# Patient Record
Sex: Male | Born: 1935 | Race: White | Hispanic: No | Marital: Married | Smoking: Former smoker
Health system: Southern US, Community
[De-identification: ages and names within clinical notes are randomized; demographics above are authoritative.]

## PROBLEM LIST (undated history)

## (undated) DIAGNOSIS — I77810 Thoracic aortic ectasia: Secondary | ICD-10-CM

## (undated) DIAGNOSIS — I10 Essential (primary) hypertension: Secondary | ICD-10-CM

## (undated) DIAGNOSIS — E7800 Pure hypercholesterolemia, unspecified: Secondary | ICD-10-CM

## (undated) DIAGNOSIS — I719 Aortic aneurysm of unspecified site, without rupture: Secondary | ICD-10-CM

## (undated) DIAGNOSIS — M1990 Unspecified osteoarthritis, unspecified site: Secondary | ICD-10-CM

## (undated) DIAGNOSIS — I420 Dilated cardiomyopathy: Secondary | ICD-10-CM

## (undated) DIAGNOSIS — L4050 Arthropathic psoriasis, unspecified: Secondary | ICD-10-CM

## (undated) DIAGNOSIS — I77819 Aortic ectasia, unspecified site: Secondary | ICD-10-CM

## (undated) DIAGNOSIS — K759 Inflammatory liver disease, unspecified: Secondary | ICD-10-CM

## (undated) DIAGNOSIS — K219 Gastro-esophageal reflux disease without esophagitis: Secondary | ICD-10-CM

## (undated) DIAGNOSIS — I2510 Atherosclerotic heart disease of native coronary artery without angina pectoris: Secondary | ICD-10-CM

## (undated) DIAGNOSIS — I351 Nonrheumatic aortic (valve) insufficiency: Secondary | ICD-10-CM

## (undated) DIAGNOSIS — I480 Paroxysmal atrial fibrillation: Secondary | ICD-10-CM

## (undated) DIAGNOSIS — E669 Obesity, unspecified: Secondary | ICD-10-CM

## (undated) DIAGNOSIS — G4733 Obstructive sleep apnea (adult) (pediatric): Secondary | ICD-10-CM

## (undated) DIAGNOSIS — G8929 Other chronic pain: Secondary | ICD-10-CM

## (undated) DIAGNOSIS — M545 Low back pain, unspecified: Secondary | ICD-10-CM

## (undated) DIAGNOSIS — Z9889 Other specified postprocedural states: Secondary | ICD-10-CM

## (undated) DIAGNOSIS — E785 Hyperlipidemia, unspecified: Secondary | ICD-10-CM

## (undated) DIAGNOSIS — N281 Cyst of kidney, acquired: Secondary | ICD-10-CM

## (undated) DIAGNOSIS — Z9289 Personal history of other medical treatment: Secondary | ICD-10-CM

## (undated) DIAGNOSIS — G5722 Lesion of femoral nerve, left lower limb: Secondary | ICD-10-CM

## (undated) DIAGNOSIS — Z9989 Dependence on other enabling machines and devices: Secondary | ICD-10-CM

## (undated) HISTORY — DX: Gastro-esophageal reflux disease without esophagitis: K21.9

## (undated) HISTORY — PX: TONSILLECTOMY: SUR1361

## (undated) HISTORY — DX: Essential (primary) hypertension: I10

## (undated) HISTORY — PX: JOINT REPLACEMENT: SHX530

## (undated) HISTORY — PX: BACK SURGERY: SHX140

## (undated) HISTORY — PX: UMBILICAL HERNIA REPAIR: SHX196

## (undated) HISTORY — DX: Thoracic aortic ectasia: I77.810

## (undated) HISTORY — PX: TOTAL HIP ARTHROPLASTY: SHX124

## (undated) HISTORY — DX: Aortic aneurysm of unspecified site, without rupture: I71.9

## (undated) HISTORY — PX: TOTAL KNEE ARTHROPLASTY: SHX125

## (undated) HISTORY — DX: Paroxysmal atrial fibrillation: I48.0

## (undated) HISTORY — PX: SHOULDER OPEN ROTATOR CUFF REPAIR: SHX2407

## (undated) HISTORY — PX: APPENDECTOMY: SHX54

## (undated) HISTORY — DX: Obesity, unspecified: E66.9

## (undated) HISTORY — DX: Atherosclerotic heart disease of native coronary artery without angina pectoris: I25.10

## (undated) HISTORY — PX: LUMBAR LAMINECTOMY: SHX95

## (undated) HISTORY — PX: HERNIA REPAIR: SHX51

## (undated) HISTORY — DX: Aortic ectasia, unspecified site: I77.819

## (undated) HISTORY — PX: LAPAROSCOPIC CHOLECYSTECTOMY: SUR755

## (undated) HISTORY — DX: Lesion of femoral nerve, left lower limb: G57.22

## (undated) HISTORY — DX: Unspecified osteoarthritis, unspecified site: M19.90

## (undated) HISTORY — DX: Arthropathic psoriasis, unspecified: L40.50

## (undated) HISTORY — DX: Obstructive sleep apnea (adult) (pediatric): G47.33

## (undated) HISTORY — PX: CATARACT EXTRACTION W/ INTRAOCULAR LENS  IMPLANT, BILATERAL: SHX1307

## (undated) HISTORY — PX: REVISION TOTAL HIP ARTHROPLASTY: SHX766

## (undated) HISTORY — DX: Cyst of kidney, acquired: N28.1

## (undated) HISTORY — DX: Dilated cardiomyopathy: I42.0

## (undated) HISTORY — DX: Hyperlipidemia, unspecified: E78.5

## (undated) HISTORY — DX: Nonrheumatic aortic (valve) insufficiency: I35.1

---
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
## 1998-11-03 ENCOUNTER — Encounter: Payer: Self-pay | Admitting: Orthopedic Surgery

## 1998-11-10 ENCOUNTER — Encounter: Payer: Self-pay | Admitting: Orthopedic Surgery

## 1998-11-10 ENCOUNTER — Inpatient Hospital Stay (HOSPITAL_COMMUNITY): Admission: RE | Admit: 1998-11-10 | Discharge: 1998-11-14 | Payer: Self-pay | Admitting: Orthopedic Surgery

## 1998-12-15 ENCOUNTER — Encounter: Payer: Self-pay | Admitting: Surgery

## 1998-12-15 ENCOUNTER — Ambulatory Visit (HOSPITAL_COMMUNITY): Admission: RE | Admit: 1998-12-15 | Discharge: 1998-12-16 | Payer: Self-pay | Admitting: Surgery

## 1999-10-20 ENCOUNTER — Ambulatory Visit (HOSPITAL_BASED_OUTPATIENT_CLINIC_OR_DEPARTMENT_OTHER): Admission: RE | Admit: 1999-10-20 | Discharge: 1999-10-20 | Payer: Self-pay | Admitting: Surgery

## 2001-04-14 ENCOUNTER — Encounter: Admission: RE | Admit: 2001-04-14 | Discharge: 2001-04-14 | Payer: Self-pay | Admitting: Geriatric Medicine

## 2001-04-14 ENCOUNTER — Encounter: Payer: Self-pay | Admitting: Geriatric Medicine

## 2003-10-08 ENCOUNTER — Ambulatory Visit (HOSPITAL_COMMUNITY): Admission: RE | Admit: 2003-10-08 | Discharge: 2003-10-08 | Payer: Self-pay | Admitting: Gastroenterology

## 2003-11-22 ENCOUNTER — Encounter: Admission: RE | Admit: 2003-11-22 | Discharge: 2003-11-22 | Payer: Self-pay | Admitting: Orthopedic Surgery

## 2003-12-16 ENCOUNTER — Inpatient Hospital Stay (HOSPITAL_COMMUNITY): Admission: RE | Admit: 2003-12-16 | Discharge: 2003-12-20 | Payer: Self-pay | Admitting: Orthopedic Surgery

## 2005-02-01 ENCOUNTER — Inpatient Hospital Stay (HOSPITAL_COMMUNITY): Admission: RE | Admit: 2005-02-01 | Discharge: 2005-02-04 | Payer: Self-pay | Admitting: Orthopedic Surgery

## 2008-08-16 DIAGNOSIS — N281 Cyst of kidney, acquired: Secondary | ICD-10-CM

## 2008-08-16 HISTORY — DX: Cyst of kidney, acquired: N28.1

## 2009-01-17 ENCOUNTER — Encounter: Admission: RE | Admit: 2009-01-17 | Discharge: 2009-01-17 | Payer: Self-pay | Admitting: Chiropractic Medicine

## 2009-03-10 ENCOUNTER — Encounter: Admission: RE | Admit: 2009-03-10 | Discharge: 2009-03-10 | Payer: Self-pay | Admitting: Geriatric Medicine

## 2010-05-07 ENCOUNTER — Encounter: Admission: RE | Admit: 2010-05-07 | Discharge: 2010-05-07 | Payer: Self-pay | Admitting: Geriatric Medicine

## 2010-12-28 ENCOUNTER — Other Ambulatory Visit: Payer: Self-pay | Admitting: Orthopedic Surgery

## 2010-12-28 ENCOUNTER — Other Ambulatory Visit (HOSPITAL_COMMUNITY): Payer: Self-pay | Admitting: Orthopedic Surgery

## 2010-12-28 ENCOUNTER — Ambulatory Visit (HOSPITAL_COMMUNITY)
Admission: RE | Admit: 2010-12-28 | Discharge: 2010-12-28 | Disposition: A | Discharge status: Home / Self Care | Payer: No Typology Code available for payment source | Source: Ambulatory Visit | Attending: Orthopedic Surgery | Admitting: Orthopedic Surgery

## 2010-12-28 ENCOUNTER — Encounter (HOSPITAL_COMMUNITY): Payer: No Typology Code available for payment source

## 2010-12-28 DIAGNOSIS — Z01810 Encounter for preprocedural cardiovascular examination: Secondary | ICD-10-CM | POA: Insufficient documentation

## 2010-12-28 DIAGNOSIS — Z01812 Encounter for preprocedural laboratory examination: Secondary | ICD-10-CM | POA: Insufficient documentation

## 2010-12-28 DIAGNOSIS — Z79899 Other long term (current) drug therapy: Secondary | ICD-10-CM | POA: Insufficient documentation

## 2010-12-28 DIAGNOSIS — Z01811 Encounter for preprocedural respiratory examination: Secondary | ICD-10-CM

## 2010-12-28 DIAGNOSIS — M5380 Other specified dorsopathies, site unspecified: Secondary | ICD-10-CM | POA: Insufficient documentation

## 2010-12-28 DIAGNOSIS — M1710 Unilateral primary osteoarthritis, unspecified knee: Secondary | ICD-10-CM | POA: Insufficient documentation

## 2010-12-28 DIAGNOSIS — Z87891 Personal history of nicotine dependence: Secondary | ICD-10-CM | POA: Insufficient documentation

## 2010-12-28 DIAGNOSIS — Z01818 Encounter for other preprocedural examination: Secondary | ICD-10-CM | POA: Insufficient documentation

## 2010-12-28 DIAGNOSIS — I10 Essential (primary) hypertension: Secondary | ICD-10-CM | POA: Insufficient documentation

## 2010-12-28 LAB — URINALYSIS, ROUTINE W REFLEX MICROSCOPIC
Hgb urine dipstick: NEGATIVE
Ketones, ur: NEGATIVE mg/dL
Protein, ur: NEGATIVE mg/dL
Urobilinogen, UA: 0.2 mg/dL (ref 0.0–1.0)
pH: 6 (ref 5.0–8.0)

## 2010-12-28 LAB — CBC
MCH: 28 pg (ref 26.0–34.0)
MCHC: 33.8 g/dL (ref 30.0–36.0)

## 2010-12-28 LAB — SURGICAL PCR SCREEN: Staphylococcus aureus: NEGATIVE

## 2010-12-28 LAB — COMPREHENSIVE METABOLIC PANEL
ALT: 47 U/L (ref 0–53)
AST: 35 U/L (ref 0–37)
BUN: 19 mg/dL (ref 6–23)
Chloride: 101 mEq/L (ref 96–112)
GFR calc Af Amer: >60 mL/min (ref >60)
GFR calc non Af Amer: >60 mL/min (ref >60)

## 2010-12-28 LAB — APTT: aPTT: 33 seconds (ref 24–37)

## 2011-01-01 ENCOUNTER — Emergency Department (HOSPITAL_COMMUNITY)
Admission: EM | Admit: 2011-01-01 | Discharge: 2011-01-02 | Disposition: A | Discharge status: Home / Self Care | Payer: No Typology Code available for payment source | Attending: Emergency Medicine | Admitting: Emergency Medicine

## 2011-01-01 ENCOUNTER — Emergency Department (HOSPITAL_COMMUNITY): Payer: No Typology Code available for payment source

## 2011-01-01 DIAGNOSIS — M62838 Other muscle spasm: Secondary | ICD-10-CM | POA: Insufficient documentation

## 2011-01-01 DIAGNOSIS — M542 Cervicalgia: Secondary | ICD-10-CM | POA: Insufficient documentation

## 2011-01-01 DIAGNOSIS — M47812 Spondylosis without myelopathy or radiculopathy, cervical region: Secondary | ICD-10-CM | POA: Insufficient documentation

## 2011-01-01 DIAGNOSIS — I10 Essential (primary) hypertension: Secondary | ICD-10-CM | POA: Insufficient documentation

## 2011-01-01 DIAGNOSIS — R51 Headache: Secondary | ICD-10-CM | POA: Insufficient documentation

## 2011-01-01 NOTE — Op Note (Signed)
[name], [name]                ACCOUNT NO.:  [id number]   MEDICAL RECORD NO.:  [id number]          PATIENT TYPE:  INP   LOCATION:  0001                         FACILITY:  [facility]   PHYSICIAN:  Ollen Gross, M.D.    DATE OF BIRTH:  [birth year]   DATE OF PROCEDURE:  02/01/2005  DATE OF DISCHARGE:                                 OPERATIVE REPORT   PREOPERATIVE DIAGNOSES:  Osteoarthritis left knee.   POSTOPERATIVE DIAGNOSES:  Osteoarthritis left knee.   PROCEDURE:  Left total knee arthroplasty computer navigated.   SURGEON:  Ollen Gross, M.D.   ASSISTANT:  Alexzandrew L. Julien Girt, P.A.   ANESTHESIA:  Spinal.   ESTIMATED BLOOD LOSS:  Minimal.   DRAINS:  Hemovac x1.   TOURNIQUET TIME:  65 minutes at 300 mmHg.   COMPLICATIONS:  None.   CONDITION:  Stable to recovery.   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 He has  a large valgus deformity of the left knee. He has failed nonoperative  management including injections and presents now for left total knee  arthroplasty.   PROCEDURE IN DETAIL:  After successful initiation of spinal anesthetic, a  tourniquet was placed high on his left thigh and left lower extremity  prepped and draped in the usual sterile fashion. Extremities wrapped in  Esmarch, knee flexed and tourniquet inflated to 300 mmHg. A standard midline  incision was made with a 10 blade through the subcutaneous tissue to the  level of the extensor mechanism. Given his large valgus deformity, we did a  lateral parapatellar arthrotomy. The soft tissue over the proximal lateral  tibia subperiosteally elevated. Patella was then everted medially, knee  flexed 90 degrees, and ACL and PCL removed. The 4 mm Shantz pins for the  computer are then placed two into the tibia and two into the femur. The  computerized arrays are then placed onto the pins. The anatomic data  is then  input into the computer for generation of the anatomical model. Once  generated, it showed that he was in 5 degrees of valgus with about a 5  degree flexion contracture. After I had released the soft tissues, we saw  that he was able to be corrected down to about 8 degrees of valgus just from  the soft tissue correction alone. The femur was then addressed by placing  the distal femoral cutting block. We had planned to remove 10 mL off the  distal femur with neutral varus-valgus and neutral flexion extension. The  block is pinned and resection made with an oscillating saw. The verifier is  placed and the and resection level is made as planned. The rotational block  is placed referencing off the epicondylar axis. A size 5 is the size  generated by the computer and we confirmed this with an intraop measurement.  A size five cutting block is then placed and the anterior posterior chamfer  cuts are made. They are verified to be correct.   Tibia subluxed forward and menisci  removed. Tibial cutting guide is placed  under computer guidance to remove approximately 9 mm off the medial side  which was partially deficient corresponding with about 4-5 mm off the  lateral side. The block is pinned to make the cut and about 2 degrees of  flexion and neutral varus-valgus. Resection is made with an oscillating saw  and it is confirmed to be made as planned. The size four is the most  appropriate tibial component and the proximal tibia is then prepared with a  modular drill and keel punch for a size four. The femoral preparation is  then completed the intercondylar cut for a size five.   A size four mobile bearing tibial trial with a size five posterior  stabilized femoral trial and a 10 mm posterior stabilized rotating platform  insert trial are placed. With the 10, full extension is achieved with  excellent varus valgus balance throughout full range of motion. We confirmed  that we corrected  him to within 1.5 degrees of valgus alignment. This was  felt to be an excellent correction from his preop deformity. He was also  achieving full extension. The osteophytes are then removed off the posterior  femur with the trial in place. The pins are then removed from the tibia and  femur. Patella was everted and thickness measured to the 23 mm. Freehand  resection is taken to 14 mm. A 38 template is placed, lug holes were  drilled, trial patella is placed and it tracks normally. All trials were  then removed and the cut bone surfaces are repaired with pulsatile lavage.  Cement is mixed and once ready for reimplantation, a size four mobile  bearing tibial tray, size five posterior stabilized femur and 38 patella are  cemented into place and the patella is held with a clamp. A trial 10 mm  insert is placed, knee held in full extension and all extruded cement  removed. Once the cement is fully hardened then the permanent 10 mm  posterior stabilized rotating platform insert is placed into the tibial  tray. The wound is copiously irrigated with saline solution. The tourniquet  is then released with a total time of 65 minutes. Minor bleeding stopped  with cautery. The arthrotomy was closed over a hemovac drain with  interrupted #1 PDS. We left open the area from the inferior to the superior  pole of the patella to serve as a mini lateral release. Patella tracks  normally with flexion of about 135 degrees. There is excellent balance  throughout full range of motion. Subcu is then closed with interrupted 2-0  Vicryl, subcuticular running 4-0 Monocryl. Incisions cleaned and dried and  Steri-Strips and a bulky sterile dressing applied. Drains hooked to suction.  He is placed into a knee immobilizer, awakened and transported to recovery  in stable condition.       FA/MEDQ  D:  02/01/2005  T:  02/01/2005  Job:  [id number]

## 2011-01-01 NOTE — H&P (Signed)
[name], [name]                ACCOUNT NO.:  [id number]   MEDICAL RECORD NO.:  [id number]          PATIENT TYPE:  INP   LOCATION:  NA                           [facility]ACILITY:  [facility]   PHYSICIAN:  Ollen Gross, M.D.    DATE OF BIRTH:  1934/11/19   DATE OF ADMISSION:  02/01/2005  DATE OF DISCHARGE:                                HISTORY & PHYSICAL   CHIEF COMPLAINT:  Left knee pain.   HISTORY OF PRESENT ILLNESS:  [name] well known to Dr. Ollen Gross having previously undergone a left total hip arthroplasty and has  done quite well. He has ongoing knee pain. He is noted to have a valgus  deformity of that knee. It starts in the knee and travels all the way up  through the thigh effecting his ability to ambulate and also effects what he  can and cannot do. He has reached a point now where it is hurting him all  the time and wants to consider surgical intervention. He is seen in the  office where he has noted valgus deformity with end-stage arthritic changes.  It was thought he would benefit from undergoing left knee replacement. The  risks and benefits were discussed and the patient is subsequently admitted  to the hospital. He has been seen preoperatively by Dr. Ann Maki T. Stoneking who  felt that there was no contraindication for his up and coming knee surgery.   ALLERGIES:  PENICILLIN with a local reaction. No systemic reaction.   MEDICATIONS:  1.  Altace 10 mg daily.  2.  Diclofenac 50 mg b.i.d. stop prior to surgery.  3.  Norvasc 5 mg daily.  4.  Lipitor 20 mg daily.   PAST MEDICAL HISTORY:  1.  Hypercholesterolemia.  2.  Hypertension.  3.  Hiatal hernia.  4.  History of hepatitis.  5.  History of jaundice.  6.  Internal hemorrhoids.  7.  Osteoarthritis.  8.  Degenerative disc disease.   PAST SURGICAL HISTORY:  1.  Left and right hip surgeries in 1954, both for slipped capital      epithesis.  2.  Appendectomy in 1959.  3.  Colonoscopy.  4.  Hernia surgery  around 2000.  5.  Gallbladder surgery around 2000.  6.  He has undergone a left total hip replacement arthroplasty and also a      right hip replacement arthroplasty about six years ago.  7.  Right shoulder surgery n 1996.  8.  Back surgery in 1983.   SOCIAL HISTORY:  Married. Pastor. Nonsmoker, no alcohol. Four children. His  wife will be assisting with care after surgery.   FAMILY HISTORY:  Father with a history of arthritis and diabetes. Mother  with a history of breast cancer.   REVIEW OF SYMPTOMS:  GENERAL:  No fevers, chills, or night sweats.  NEUROLOGICAL:  No seizures, syncope, or paralysis. RESPIRATORY:  No  shortness of breath, productive cough, or hemoptysis. CARDIOVASCULAR:  No  chest pain, angina, or orthopnea. GI:  No nausea, vomiting, diarrhea, or  constipation. No blood or mucus in the  stool. GU:  No dysuria, hematuria, or  discharge. MUSCULOSKELETAL:  Left knee found in the history of present  illness.   PHYSICAL EXAMINATION:  VITAL SIGNS:  Pulse 64, respirations 12, blood  pressure 106/64.  GENERAL:  A 76 year old white male well-developed, well-nourished, large  frame, in no acute distress, slightly overweight. Alert, oriented, and  cooperative.  HEENT:  Normocephalic and atraumatic. Pupils are equal, round, and reactive.  Oropharynx clear. EOMs intact.  CHEST:  Clear anterior and posterior chest wall.  HEART:  Regular rate and rhythm. No murmurs.  ABDOMEN:  Soft and nontender. Bowel sounds present.  RECTAL:  Not done, not pertinent to present illness.  BREASTS:  Not done, not pertinent to present illness.  GENITALIA:  Not done, not pertinent to present illness.  EXTREMITIES:  Left knee shows a valgus deformity, malalignment, marked  crepitus noted on passive range of motion. Range of motion of 10 to 110  degrees. No instability. She had no effusion.   IMPRESSION:  1.  Osteoarthritis left knee.  2.  Hypercholesterolemia.  3.  Hypertension.  4.  Hiatal  hernia.  5.  History of hepatitis.  6.  History of jaundice.  7.  History of internal hemorrhoids.  8.  Degenerative disc disease.   PLAN:  The patient is admitted to [facility] to undergo a left  total knee replacement arthroplasty. Surgery will be performed by Dr. Ollen Gross. His medical physician is Dr. Ann Maki T. Stoneking who will be notified  of the room on admission and be consulted if needed for medical assistance  with the patient throughout the hospital course.       ALP/MEDQ  D:  01/31/2005  T:  01/31/2005  Job:  [id number]   cc:   Hal T. Stoneking, M.D.  [address]  [place], [place] [address]  Fax: [phone]   Ollen Gross, M.D.  [facility]  [address]  [address]  [place]  [place] [address]  Fax: [phone]

## 2011-01-01 NOTE — Discharge Summary (Signed)
NAME:  [name], [name]                          ACCOUNT NO.:  [id number]   MEDICAL RECORD NO.:  [id number]                   PATIENT TYPE:  INP   LOCATION:  0476                                 [facility]ACILITY:  [facility]   PHYSICIAN:  Ollen Gross, M.D.                 DATE OF BIRTH:  09/19/1934   DATE OF ADMISSION:  12/16/2003  DATE OF DISCHARGE:  12/20/2003                                 DISCHARGE SUMMARY   ADMISSION DIAGNOSES:  1. Osteoarthritis, left hip.  2. Hypercholesterolemia.  3. Hypertension.  4. Hiatal hernia.  5. Hemorrhoids.  6. Degenerative disk disease.   DISCHARGE DIAGNOSES:  1. Osteoarthritis, left hip, status post left total hip arthroplasty.  2. Hypercholesterolemia.  3. Hypertension.  4. Hiatal hernia.  5. Hemorrhoids.  6. Degenerative disk disease.  7. Mild postoperative hyponatremia.  8. Mild postoperative hypokalemia.  9. Postoperative ileus, improved.   PROCEDURE:  The patient was taken to the OR on Dec 16, 2003 and underwent  left total hip arthroplasty.   SURGEON:  Dr. Trudee Grip.   ASSISTANT:  Alexzandrew L. Perkins, P.A.-C.   ANESTHESIA:  General.   BLOOD LOSS:  500 cc.   DRAINS:  Hemovac x1.   BRIEF HISTORY:  Mr. [name] is a 76 year old male with end-stage arthritis  of the left hip. The pain has been refractory to nonoperative management.  Now presents for total hip replacement.   CONSULTATIONS:  GI, Dr. Danise Edge.   LABORATORY DATA:  CBC on admission:  Hemoglobin 15.9, hematocrit 46.2, white  cell count 8.9, differential within normal limits. Postoperative H and H  13.2 and 38.9, lasted noted H and H 12.8 and 37.7. PT/PTT preoperatively  12.1 and 30 respectively, INR 0.9. Serial protimes were followed, last noted  PT/INR 24.0 and 3.0. Chem panel on admission:  Elevated BUN to 24.  Remaining chem panel and all other serial BMETs were followed. BUN 24, back  down to 13, back up to 25. Sodium dropped 136, down to 130, last noted 129.  Potassium dropped, 4.4 down to 3.9, lasted noted 3.2. Glucose 176 to 157,  back down 122. Urinalysis:  Preoperative negative. Blood type A positive.   EKG dated December 09, 2003:  Normal sinus rhythm, normal EKG confirmed by  __________.  Two-view chest on December 09, 2003:  No acute cardiopulmonary  disease. Two-view of the left hip:  Moderate degenerative changes, left hip,  no acute bony abnormalities. Postoperative pelvis and hip films:  Good  appearance of left total hip replacement arthroplasty. Portable KUB,  abdominal film on Dec 18, 2003:  Mild diffuse gaseous distention, large and  small bowel suggest ileus. Postoperative portable abdominal film on May  2005:  Slight improvement of diffuse ileus.   HOSPITAL COURSE:  The patient was admitted to [facility], taken to  the OR, and underwent the above stated procedure without complication. The  patient tolerated  the procedure well and was transferred to the recovery  room and to the orthopedic floor for continued postoperative care, and vital  signs were followed. The patient was given 24 hours postoperative IV  antibiotics in the form of Ancef. Given Coumadin postoperative. Started back  on home medications. PT and OT were consulted. The patient was placed on  partial weight bearing 50% to the left lower extremity. Hemovac drain was  placed during surgery and pulled on postoperative day #1. The patient had a  little bit of discomfort, complaining of incision pain, otherwise doing  well. By day #2, the patient was not feeling well, had some abdominal  bloating and cramping. KUB was ordered that did show some gaseous  distention, indicating ileus. IV Reglan was ordered. Dressing was changed  from the hip incision. The incision was healing well. Did have some proximal  blisters. Tegaderm was placed over them. By day #3, the patient was feeling  much better. Followup KUB did show improvement in the ileus. He had started  to move  his bowels and had some diarrhea. He did have some nausea and  vomiting postoperative which was felt to be due to the ileus. Fluids were  increased. Gastroenterology consult was called. The patient was seen in  consultation by Dr. Robin Searing. The patient was started on IV Protonix.  The narcotic medications and muscle relaxers were stopped, started on  MiraLax. By the following day, the patient was doing much better, moving his  bowels. Heartburn had improved. Started to eat a few bits of food. Once the  ileus had improved, he was up ambulating much better with physical therapy  approximately 60 feet, already done some stair training. He was actually  doing so well that he felt better. It was okay from Dr. Henriette Combs standpoint  that the patient be discharged home. He was set up to go later that day.   DISCHARGE PLAN:  The patient was discharged home on Dec 20, 2003.   DISCHARGE MEDICATIONS:  Prilosec and Coumadin. The patient does have  Darvocet at home for pain.   ACTIVITY:  Up as tolerated, 50% partial weight bearing, left lower  extremity. Home health PT, home health nursing.   FOLLOW UP:  Two weeks from surgery.   DISPOSITION:  Home.   DIET:  As tolerated.   CONDITION ON DISCHARGE:  Improved.     Alexzandrew L. Julien Girt, P.A.              Ollen Gross, M.D.    ALP/MEDQ  D:  01/01/2004  T:  01/01/2004  Job:  [id number]   cc:   Danise Edge, M.D.  [address]  [place]  [place] [address]  Fax: [phone]

## 2011-01-01 NOTE — Op Note (Signed)
[facility]  Patient:    [name], [name]                       MRN: [id number] Proc. Date: 10/20/99 Adm. Date:  [id number] Disc. Date: [id number] Attending:  Bonnetta Barry CC:         Velora Heckler, M.D.                           Operative Report  PREOPERATIVE DIAGNOSIS:  Incisional hernia.  POSTOPERATIVE DIAGNOSIS:  Incisional hernia.  OPERATION PERFORMED:  Repair incisional hernia with Prolene mesh.  SURGEON:  Velora Heckler, M.D.  ASSISTANT:  ANESTHESIA:  General.  ESTIMATED BLOOD LOSS:  Minimal.  PREPARATION:  Betadine.  COMPLICATIONS:  None.  INDICATIONS FOR PROCEDURE:  The patient is a 76 year old white male who had previously undergone laparoscopic cholecystectomy.  The incision at the umbilicus had apparently herniated and now contains a moderate amount of intestine within the hernia sac.  The patient comes to surgery for repair.  DESCRIPTION OF PROCEDURE:  The procedure was done in OR #4 at the [facility].  The patient was brought to the operating room and placed in supine position on the operating room table.  Following administration of general anesthesia, the patient was prepped and draped in the usual strict aseptic fashion. After ascertaining that an adequate level of anesthesia had been obtained, a transverse incision was made just below the umbilicus.  Dissection was carried own through the subcutaneous tissues.  A hernia sac was identified and opened.  It oes contain multiple loops of small bowel which are reduced back within the peritoneal cavity.  The hernia sac was excised.  The fascial edges were developed.  The subcutaneous were elevated off of the fascia in a wide plane around the hernia.  The umbilicus was mobilized and there was a small umbilical hernia as well. The small bowel was reduced back within the peritoneal cavity.  The fascial edges were freshened and then closed  with interrupted #1 Ethibond sutures.  The umbilical defect was closed with #1 interrupted Ethibond sutures.  Next, the repair was reinforced with a sheet of Prolene mesh.  A 3 x 6 inch sheet of mesh was rounded on the corners and then placed in the wound.  It was secured circumferentially with 0 Ethibond sutures.  Good hemostasis was noted.  A #10 Jackson-Pratt drain was brought in from the right side of the abdomen and secured to the skin with a 3-0 nylon suture.  It was laid across the mesh.  The umbilicus was affixed back down to the mesh with an interrupted 3-0 Vicryl suture.  The subcutaneous tissues were reapproximated with interrupted 3-0 Vicryl sutures.  The skin was closed with interrupted 4-0 Vicryl subcuticular sutures.  The wound was washed and dried and benzoin and Steri-Strips were applied.  Sterile gauze dressings were applied. he patient was brought to the recovery room in good condition.  The patient tolerated the procedure well. DD:  10/20/99 TD:  10/21/99 Job: [id number]

## 2011-01-01 NOTE — Op Note (Signed)
NAME:  LAURANCE, HEIDE                          ACCOUNT NO.:  [id number]   MEDICAL RECORD NO.:  [id number]                   PATIENT TYPE:  INP   LOCATION:  0476                                 FACILITY:  [facility]   PHYSICIAN:  Ollen Gross, M.D.                 DATE OF BIRTH:  Jan 12, 1935   DATE OF PROCEDURE:  12/16/2003  DATE OF DISCHARGE:                                 OPERATIVE REPORT   PREOPERATIVE DIAGNOSES:  Osteoarthritis, left hip.   POSTOPERATIVE DIAGNOSES:  Osteoarthritis, left hip.   PROCEDURE:  Left total hip arthroplasty.   SURGEON:  Ollen Gross, M.D.   ASSISTANT:  Alexzandrew L. Julien Girt, P.A.   ANESTHESIA:  General.   ESTIMATED BLOOD LOSS:  500   DRAINS:  Hemovac x1.   COMPLICATIONS:  None.   CONDITION:  Stable to recovery.   BRIEF CLINICAL NOTE:  Mr. Reetz is a 76 year old male with end-stage  arthritis of the left hip with pain refractory to nonoperative management.  He presents now for left total hip arthroplasty.   DESCRIPTION OF PROCEDURE:  After successful administration of general  anesthetic, the patient was placed in the right lateral decubitus position  with the left side up and held with a hip positioner. The left lower  extremity was isolated from his perineum with plastic drapes and prepped and  draped in the usual sterile fashion. A standard posterolateral incision was  made with a 10 blade through the subcutaneous tissues to the level of the  fascia lata which was incised in line with the skin incision. The sciatic  nerve was palpated and protected and the short external rotators isolated  off the femur. Capsulectomy is performed and the hip is dislocated.  The  center of the femoral head is marked and a trial prosthesis placed such that  the center of the trial head corresponds to the center of his native femoral  head.  Osteotomy line is marked on the femoral neck and osteotomy made with  the oscillating saw. The femur is then retracted  anteriorly to gain  acetabular exposure.   The labrum and osteophytes removed from the acetabulum, the reaming starts  at a 45 coursing in increments of 2 to a 57 and then a 58 mm pinnacle  acetabular shell was placed in anatomic position and transfixed with two  dome screws. A trial 32 mm neutral liner was placed.   The femur was prepared first with a canal finder and then irrigation. Axial  reaming is performed to 17.5 mm, proximal reaming to a 17F and the sleeve  machined to an XXL.  The 17F XXL sleeve trial was placed with a 22 x 17  stem, 36 plus 8 neck matching his native anteversion which was 20 degrees.  The 32 plus 0 head is placed and the hip is reduced with excellent  stability, full extension, full external rotation, 70 degrees flexion,  40  degrees adduction, 90 degrees internal rotation and 90 degrees flexion and  70 degrees internal rotation.  By placing the left leg on top of the right,  the leg lengths are found to be equal.   All trials are removed and the permanent apex hole eliminator is placed into  the acetabular shell. The permanent 32 mm neutral marathon liner is placed.  A permanent 71F XXL sleeve is placed with a 22 x 17 stem, 36 plus 8 neck  matching his native anteversion. The 32 plus 0 head is then placed and the  hip is reduced with the same stability parameters. The wound is copiously  irrigated with antibiotic solution and short rotators reattached to the  femur through drill holes. The fascia lata is closed over a Hemovac drain  with interrupted #1 Vicryl, subcu closed with #1 and 2-0 Vicryl and  subcuticular with running 4-0 Monocryl. The incision was clean and dry and  Steri-Strips and a bulky sterile dressing applied. Prior to placing a  dressing, 30 mL of 0.25% Marcaine with epinephrine injected into the subcu  tissues about the incision. After the dressing was applied, he was placed  into a knee immobilizer, awakened and transported to recovery in  stable  condition.                                               Ollen Gross, M.D.    FA/MEDQ  D:  12/16/2003  T:  12/17/2003  Job:  [id number]

## 2011-01-01 NOTE — H&P (Signed)
NAME:  [name], [name]                          ACCOUNT NO.:  [id number]   MEDICAL RECORD NO.:  [id number]                   PATIENT TYPE:  INP   LOCATION:  0476                                 FACILITY:  [facility]   PHYSICIAN:  Ollen Gross, M.D.                 DATE OF BIRTH:  [birth year]   DATE OF ADMISSION:  12/16/2003  DATE OF DISCHARGE:                                HISTORY & PHYSICAL   CHIEF COMPLAINT:  Left hip pain.   HISTORY OF PRESENT ILLNESS:  The patient is a 76 year old male who has been  seen by Dr. Lequita Halt for ongoing left hip pain.  He has been seen in the  office and known to have left hip arthritis.  Previous x-rays show  arthritis, but no significant bone-on-bone changes.  The patient complains  of much of his pain in the left hip or in the buttock region and lateral  area.  Pain has been progressive to the point where he has reached a point  where he would like to have something done about it.  It is felt he would  benefit from undergoing a hip replacement.  Risks and benefits of this  procedure have been discussed with the patient, and he elects to proceed  with surgery.   ALLERGIES:  PENICILLIN was a local injection reaction, no systemic reaction.   CURRENT MEDICATIONS:  1. Altace.  2. Norvasc.  3. Lipitor.  4. _____________stopped prior to surgery.   PAST MEDICAL HISTORY:  1. Hypercholesterolemia.  2. Hypertension.  3. Hiatal hernia.  4. History of hepatitis.  5. History of jaundice.  6. Hemorrhoids.  7. Osteoarthritis.  8. Degenerative disk disease.   PAST SURGICAL HISTORY:  1. Left and right hip surgeries in 1954, both with slipped capital     epiphysis.  2. Appendectomy in 1959.  3. Hernia surgery around 2000.  4. Gallbladder surgery in 2000.  5. Two more hip surgeries.  6. Shoulder surgery.  7. Back surgery.   SOCIAL HISTORY:  Married, works as a Education officer, environmental.  Nonsmoker, no alcohol.  Has  four children.  His wife will be assisting with care  after surgery.   FAMILY HISTORY:  Father with a history of arthritis and diabetes.  Mother  with a history of breast cancer.   REVIEW OF SYSTEMS:  GENERAL:  No fevers, chills, night sweats.  NEUROLOGIC:  No seizures, syncope, paralysis.  RESPIRATORY:  No shortness of breath,  productive cough, hemoptysis.  CARDIOVASCULAR:  No chest pain, angina,  orthopnea.  GASTROINTESTINAL:  No nausea, vomiting, diarrhea, constipation,  no blood or mucus in the stool.  GENITOURINARY:  No dysuria, hematuria,  discharge.  MUSCULOSKELETAL:  Pertinent to that of the hip found in the  history of present illness.   PHYSICAL EXAMINATION:  VITAL SIGNS:  Pulse 64, respirations 12, blood  pressure 132/82.  GENERAL:  A 76 year old white male, well-developed, well-nourished,  in no  acute distress, who is alert, oriented, and cooperative.  Large framed  individual.  He is accompanied by his wife.  HEENT:  Normocephalic, atraumatic.  Pupils round and reactive.  Oropharynx  clear.  EOM's intact.  The patient does wear glasses.  NECK:  Supple, no carotid bruits.  CHEST:  Clear anterior and posterior chest walls, no wheezes, rhonchi, or  rales.  HEART:  Regular rate and rhythm.  No murmurs.  ABDOMEN:  Soft, nontender.  Slightly round abdomen, blood sugar are present.  Soft abdomen.  RECTAL:  Not done, not pertinent to present illness.  BREASTS:  Not done, not pertinent to present illness.  GENITOURINARY:  Not done, not pertinent to present illness.  EXTREMITIES:  Significant to that of the left hip.  He does have hip flexion  of 100 degrees, internal rotation and external rotation of 20 and 30,  abduction of 40.  NEUROLOGIC:  Intact.   IMPRESSION:  1. Osteoarthritis, left hip.  2. Hypercholesterolemia.  3. Hypertension.  4. Hiatal hernia.  5. Hemorrhoids.  6. Degenerative disk disease.   PLAN:  The patient will be admitted to [facility] to undergo a  left total knee replacement arthroplasty.   Surgery will be performed by Dr.  Ollen Gross.     [name], P.A.              Ollen Gross, M.D.    ALP/MEDQ  D:  12/19/2003  T:  12/19/2003  Job:  [id number]   cc:   Hal T. Stoneking, M.D.  [address] [place], [place] [address]  Fax: [phone]

## 2011-01-01 NOTE — Op Note (Signed)
NAME:  [name], [name]                          ACCOUNT NO.:  [id number]   MEDICAL RECORD NO.:  [id number]                   PATIENT TYPE:  AMB   LOCATION:  ENDO                                 FACILITY:  [facility]   PHYSICIAN:  Danise Edge, M.D.                DATE OF BIRTH:  [birth year]   DATE OF PROCEDURE:  10/08/2003  DATE OF DISCHARGE:                                 OPERATIVE REPORT   PROCEDURE:  Screening colonoscopy.   REFERRING PHYSICIAN:  Hal T. Stoneking, M.D.   PROCEDURE INDICATION:  [name] is a 76 year old male born [birth year].  [name] is scheduled to undergo his first screening  colonoscopy with polypectomy to prevent colon cancer.   ENDOSCOPIST:  Danise Edge, M.D.   PREMEDICATION:  Versed 10 mg, Demerol 50 mg.   DESCRIPTION OF PROCEDURE:  After obtaining informed consent, [name]  was placed in the left lateral decubitus position.  I administered  intravenous Demerol and intravenous Versed to achieve conscious sedation for  the procedure.  The patient's blood pressure, oxygen saturation, and cardiac  rhythm were monitored throughout the procedure and documented in the medical  record.   Anal inspection was normal.  I was unable to perform a digital rectal  examination as it was too painful to traverse the anal canal.  The Olympus  adjustable pediatric colonoscope was introduced into the rectum and advanced  to the cecum.  Colonic preparation for the exam today was excellent.   Rectum normal.   Sigmoid colon and descending colon normal.   Splenic flexure normal.   Transverse colon normal.   Hepatic flexure normal.   Ascending colon normal.   Cecum and ileocecal valve normal.   ASSESSMENT:  Normal screening proctocolonoscopy to the cecum.  No endoscopic  evidence for the presence of colorectal neoplasia.                                               Danise Edge, M.D.    MJ/MEDQ  D:  10/08/2003  T:  10/08/2003  Job:   [id number]   cc:   Hal T. Stoneking, M.D.  [address] [place], [place] [address]  Fax: [phone]

## 2011-01-01 NOTE — Discharge Summary (Signed)
[name], [name]                ACCOUNT NO.:  [id number]   MEDICAL RECORD NO.:  [id number]          PATIENT TYPE:  INP   LOCATION:  1505                         FACILITY:  [facility]   PHYSICIAN:  Ollen Gross, M.D.    DATE OF BIRTH:  03-Apr-1935   DATE OF ADMISSION:  02/01/2005  DATE OF DISCHARGE:  02/04/2005                                 DISCHARGE SUMMARY   ADMISSION DIAGNOSES:  1.  Osteoarthritis, left knee.  2.  Hypercholesterolemia.  3.  Hypertension.  4.  Hiatal hernia.  5.  History of hepatitis.  6.  History of jaundice.  7.  History of internal hemorrhoids.  8.  Degenerative disk disease.   DISCHARGE DIAGNOSES:  1.  Osteoarthritis, left knee, status post total knee arthroplasty, computer      navigation assisted.  2.  Hypercholesterolemia.  3.  Hypertension.  4.  Hiatal hernia.  5.  History of hepatitis.  6.  History of jaundice.  7.  History of internal hemorrhoids.  8.  Degenerative disk disease.  9.  Mild postoperative hyponatremia, improved.  10. Mild postoperative blood loss anemia.  Did not require transfusion.   PROCEDURE:  On February 01, 2005, left total knee arthroplasty, computer  navigation assisted.   SURGEON:  Ollen Gross, M.D.   ASSISTANT:  Alexzandrew L. Perkins, P.A.-C.   ANESTHESIA:  Spinal anesthesia.   TOURNIQUET TIME:  Sixty-five minutes.   CONSULTS:  None.   BRIEF HISTORY:  [name] is a 76 year old male with severe end-  stage osteoarthritis in both knees, left more symptomatic than right.  A  large valgus deformity.  Failed nonoperative management.  Now presents for  total knee.   LABORATORY DATA:  CBC:  Preop hemoglobin 15, hematocrit 44.3, differential  within normal limits.  Normal white count of 8.6.  Postop hemoglobin 13.2.  Last noted H&H 11.9 and 35.1.  PT/PTT preop 12.2 and 29, respectively.  Serial pro times followed.  Last noted PT/INR 28.2 and 2.6.  Chem panel all  within normal limits.  Serial BMETs are  followed.  Sodium did drop from 139  to 129, back up to 135.  Glucose went up to 98 to 140, back down to 127.  Calcium dropped from 9.6 to 8.3, back up to 8.5.  Urinalysis cloudy,  otherwise negative.  Blood group type A+.   EKG:  Dated January 28, 2005:  Normal sinus rhythm.  Minimal voltage criteria.  LVH.  Unconfirmed.   CHEST X-RAY:  Two-view on January 28, 2005:  No acute cardiopulmonary disease.   HOSPITAL COURSE:  Patient was admitted to [facility] , tolerated  procedure well.  Later was transferred to the recovery room and then to the  orthopedic floor.  Doing fairly well on the morning of day #1.  Did have  some abdominal problems.  He had developed an ileus on the last  hospitalization and given Protonix the last time.  Started that on postop  day #1.  Found a pillow under his operative knee.  Recommended that he did  not keep a pillow under total knee.  Allowed him to get his knee straight.  Hemovac drain was pulled.  Encouraged his mobility because of his previous  ileus.  By day #2, he was doing a little bit better.  He actually had bowel  movements in the evening of day #1 and felt much better.  No nausea.  He  started getting up with physical therapy and ambulating approximately 45  feet, then later 80 feet.  IV's and PCA's were discontinued.   By day #3, he was doing very well, ambulating 60 feet.  Tolerating the meds  and wanted to go home.   DISCHARGE PLAN:  1.  Patient was discharged home on February 04, 2005.  2.  Discharge diagnoses:  Please see above.  3.  Discharge meds:  Percocet, Robaxin, Coumadin.  4.  Diet:  Resume previous home diet.  5.  Follow up two weeks.  6.  Activity:  Full weightbearing.  Home health PT/OT and home health      nursing.  May start showering.   DISPOSITION:  Home.   CONDITION ON DISCHARGE:  Improved.       ALP/MEDQ  D:  02/24/2005  T:  02/24/2005  Job:  [id number]   cc:   Hal T. Stoneking, M.D.  [address] [place], [place] [address]  Fax: [phone]

## 2011-01-04 ENCOUNTER — Inpatient Hospital Stay (HOSPITAL_COMMUNITY)
Admission: RE | Admit: 2011-01-04 | Discharge: 2011-01-10 | DRG: 470 | Disposition: A | Discharge status: Home Health | Payer: No Typology Code available for payment source | Source: Ambulatory Visit | Attending: Orthopedic Surgery | Admitting: Orthopedic Surgery

## 2011-01-04 DIAGNOSIS — M25519 Pain in unspecified shoulder: Secondary | ICD-10-CM | POA: Diagnosis not present

## 2011-01-04 DIAGNOSIS — Z8619 Personal history of other infectious and parasitic diseases: Secondary | ICD-10-CM

## 2011-01-04 DIAGNOSIS — M25539 Pain in unspecified wrist: Secondary | ICD-10-CM | POA: Diagnosis not present

## 2011-01-04 DIAGNOSIS — Z96649 Presence of unspecified artificial hip joint: Secondary | ICD-10-CM

## 2011-01-04 DIAGNOSIS — I10 Essential (primary) hypertension: Secondary | ICD-10-CM | POA: Diagnosis present

## 2011-01-04 DIAGNOSIS — M1710 Unilateral primary osteoarthritis, unspecified knee: Principal | ICD-10-CM | POA: Diagnosis present

## 2011-01-04 DIAGNOSIS — Z01812 Encounter for preprocedural laboratory examination: Secondary | ICD-10-CM

## 2011-01-04 DIAGNOSIS — E785 Hyperlipidemia, unspecified: Secondary | ICD-10-CM | POA: Diagnosis present

## 2011-01-04 DIAGNOSIS — M21869 Other specified acquired deformities of unspecified lower leg: Secondary | ICD-10-CM | POA: Diagnosis present

## 2011-01-04 LAB — TYPE AND SCREEN

## 2011-01-05 LAB — CBC
HCT: 40.3 % (ref 39.0–52.0)
Hemoglobin: 13.3 g/dL (ref 13.0–17.0)
RBC: 4.79 MIL/uL (ref 4.22–5.81)

## 2011-01-05 LAB — BASIC METABOLIC PANEL
BUN: 11 mg/dL (ref 6–23)
Calcium: 8.5 mg/dL (ref 8.4–10.5)
Creatinine, Ser: 0.71 mg/dL (ref 0.4–1.5)
Glucose, Bld: 130 mg/dL — ABNORMAL HIGH (ref 70–99)

## 2011-01-06 LAB — BASIC METABOLIC PANEL
BUN: 10 mg/dL (ref 6–23)
GFR calc non Af Amer: >60 mL/min (ref >60)
Glucose, Bld: 129 mg/dL — ABNORMAL HIGH (ref 70–99)

## 2011-01-06 LAB — CBC
HCT: 38 % — ABNORMAL LOW (ref 39.0–52.0)
RBC: 4.61 MIL/uL (ref 4.22–5.81)
RDW: 13.3 % (ref 11.5–15.5)
WBC: 14.2 10*3/uL — ABNORMAL HIGH (ref 4.0–10.5)

## 2011-01-06 NOTE — Op Note (Signed)
NAMEJOVAN, [name]                ACCOUNT NO.:  [id number]  MEDICAL RECORD NO.:  [id number]           PATIENT TYPE:  I  LOCATION:  0004                         FACILITY:  [facility]  PHYSICIAN:  Ollen Gross, M.D.    DATE OF BIRTH:  04-Jun-1935  DATE OF PROCEDURE:  01/04/2011 DATE OF DISCHARGE:                              OPERATIVE REPORT   PREOPERATIVE DIAGNOSIS:  Osteoarthritis, right knee.  POSTOPERATIVE DIAGNOSIS:  Osteoarthritis, right knee.  PROCEDURE:  Right total knee arthroplasty.  SURGEON:  Ollen Gross, MD  ASSISTANT:  Rozell Searing, PA-C  ANESTHESIA:  Spinal.  ESTIMATED BLOOD LOSS:  Minimal.  DRAINS:  Hemovac x1.  TOURNIQUET TIME:  44 minutes at 300 mmHg.  COMPLICATIONS:  None.  CONDITION:  Stable to recovery room.  BRIEF CLINICAL NOTE:  [name] is a 76 year old male who has severe advanced end-stage arthritis of the right knee with severe valgus deformity of the left 15 to 20 degrees angulation.  He has had progressively worsening pain and dysfunction of this knee.  He has failed nonoperative management and presents now for right total knee arthroplasty.  PROCEDURE IN DETAIL:  After successful administration of spinal anesthetic, a tourniquet was placed high on his right thigh and his right lower extremity was prepped and draped in usual sterile fashion. Extremity was wrapped in Esmarch, knee flexed and tourniquet inflated to 300 mmHg.  Midline incision made with #10 blade through subcutaneous tissue to the level of the extensor mechanism.  He had a severe valgus deformity.  He also had a flexion contraction and stiff knee, so I decided that a lateral approach could potentially be dangerous, we have difficulty everting the patella medially with ruptured tendon.  This with a medial arthrotomy, with a fresh blade.  We did not elevate any soft tissue medially leaving out the sleeve  intact.  I made a mini arthrotomy laterally starting at about the  mid-aspect of the patella, leaving intact the superolateral genicular artery and then forced  down lateral to patellar tendon and elevated all the soft tissue over the proximal lateral tibia all the way to the posterolateral corner but not including the structures of the posterolateral corner.  Patella was then everted laterally, knee flexed 90 degrees, ACL and PCL were removed. Drill was used to create a starting hole in the distal femur.  Canal was thoroughly irrigated.  A 5-3 right valgus alignment guide was placed. Distal femoral cutting block was pinned to remove 11 mm off the distal femur given the flexion contracture.  Distal femoral resection was made with an oscillating saw.  Sizing block was placed, size 5 was the most appropriate.  Rotation was marked at the epicondylar axis.  Size 5 cutting block was placed and anterior-posterior and chamfer cuts were made.  The tibia subluxed forward and menisci were removed.  The extramedullary tibial alignment guide was placed referencing proximally at the medial aspect of tibial tubercle and distally along the second metatarsal axis of tibial crest.  Block was pinned to remove 2 mm off the more deficient lateral side.  Tibial resection was made with an oscillating  saw.  Size 4 was the most appropriate tibial component and the proximal tibia was prepared with a modular drill and keel punch for the size 4.  Femoral preparations were completed with the intercondylar cut for the size 5. Size 5 posterior stabilized femoral trial was placed.  A 10-mm posterior stabilized rotating platform insert trial was placed.  With the 10, full extension was achieved with excellent varus-valgus and anterior- posterior balance throughout full range of motion.  Patella was everted and thickness measured to be 25 mm.  Freehand resection was taken to 13 mm, 41 template placed and the lug holes drilled in parapatellar space, and it tracks normally.  Osteophytes  were removed off the posterior femur with the trial in place.  All trials were removed and the cut bone surfaces were prepared with pulsatile lavage.  Cement was mixed and femur model bearing tibial tray, size 5 posterior stabilized femur and 41 patella were cemented in place.  The patella was held with clamp. Trial 10-mm inserts were placed, knee held in full extension, all extruded cement removed.  Cement fully hardened, then the permanent 10- mm posterior stabilized rotating platform insert was placed in the tibial tray.  Wound was copiously irrigated with saline solution and the medial arthrotomy closed with interrupted #1 PDS and then the lateral arthrotomy was closed leaving up a small area for a partial lateral release.  Flexion against gravity was 135 degrees.  Patella tracks normally.  The tourniquet then released after total time of 44 minutes. Subcu was then closed with interrupted 2-0 Vicryl and subcuticular running 4-0 Monocryl.  Catheter for Marcaine pain pump was placed and the pump was initiated.  Incisions were cleaned and dried and Steri- Strips and bulky sterile dressing were applied.  Knee was then placed into a knee immobilizer, awakened and transported to recovery in stable condition.     Ollen Gross, M.D.     FA/MEDQ  D:  01/04/2011  T:  01/04/2011  Job:  [id number]  Electronically Signed by Ollen Gross M.D. on 01/06/2011 12:55:58 PM

## 2011-01-06 NOTE — H&P (Addendum)
[name], Snow                ACCOUNT NO.:  [id number]  MEDICAL RECORD NO.:  [id number]         PATIENT TYPE:  INP  LOCATION:                               FACILITY:  [facility]  PHYSICIAN:  Ollen Gross, M.D.    DATE OF BIRTH:  Feb 07, 1935  DATE OF ADMISSION:  01/04/2011 DATE OF DISCHARGE:                             HISTORY & PHYSICAL   CHIEF COMPLAINT:  Right knee pain.  BRIEF HISTORY:  [name] has been followed by Dr. Lequita Halt for worsening pain in his right knee.  He is experiencing quite a bit of swelling.  He has been coming in for several injections; unfortunately, they are not giving him as much relief as they used to.  He feels that the knee pain is limiting his activities and he now presents for right total knee arthroplasty.  MEDICATION ALLERGIES:  None.  PRIMARY CARE PHYSICIAN:  Hal T. Stoneking, M.D.  CURRENT MEDICATIONS: 1. Meloxicam. 2. Amlodipine. 3. Lisinopril.  PAST MEDICAL HISTORY: 1. End-stage arthritis of the right knee. 2. History of bronchitis. 3. Cataracts. 4. Dentures, both top and bottom. 5. Hypertension. 6. Hyperlipidemia. 7. Varicose veins. 8. Hiatal hernia. 9. Hemorrhoids. 10.History of hepatitis. 11.History of gallbladder disease. 12.Arthritis. 13.Degenerative disk disease. 14.Bursitis. 15.History of measles as a child. 16.History of mumps as a child.  PAST SURGICAL HISTORY: 1. The patient has had left and right hip surgeries in 1954, both for     slipped capital epiphysis. 2. Appendectomy. 3. Colonoscopy. 4. Hernia repair. 5. Cholecystectomy. 6. Left total hip arthroplasty. 7. Right total hip arthroplasty. 8. Right rotator cuff repair. 9. Lumbar surgery. 10.Left total knee arthroplasty.  The patient has done fine with anesthesia in the past and he does prefer spinal.  FAMILY MEDICAL HISTORY:  Father passed at age of 70, he had diabetes. Mother passed at age of 77 from cancer.  SOCIAL HISTORY:  The patient is  married.  He admits past use of tobacco products.  Denies using alcohol.  He has 4 children.  Lives at home with his wife.  He does plan to go home following his hospital stay.  If possible, he would like therapist Dr. Azalia Bilis with [facility].  REVIEW OF SYSTEMS:  GENERAL:  Negative for fever, chills or weight change.  HEENT:  Negative for headache or blurred vision.  The patient does wear dentures.  RESPIRATORY:  Negative for shortness of breath at rest or on exertion.  CARDIOVASCULAR:  Negative for chest pain or palpitations.  GI:  Negative for nausea, vomiting, or diarrhea.  GU: Positive for weak stream and urinating at nighttime.  MUSCULOSKELETAL: Positive for joint pain, back pain, and morning stiffness.  PHYSICAL EXAMINATION:  VITAL SIGNS:  Pulse 86, respirations 18, blood pressure 128/77 in the left arm. GENERAL:  [name] is alert and oriented x3.  He is well-developed, well-nourished, in no apparent distress.  He is a pleasant 76 year old male.  He has a stated height of 6 feet and stated weight of 220 pounds. NECK:  Supple.  Full range of motion without lymphadenopathy. CHEST:  Lungs are clear to auscultation bilaterally without wheezes. HEART:  Regular rate and rhythm without murmur.  S1, S2 sounds are appreciated. ABDOMEN:  Bowel sounds present in all 4 quadrants.  Abdomen is soft, nontender to palpation. EXTREMITIES:  Right knee valgus deformity.  Marked crepitus throughout range of motion.  Negative for effusion. SKIN:  Unremarkable. NEUROLOGIC:  Intact. PERIPHERAL VASCULAR:  Carotid pulses 2+ bilaterally without bruit.  RADIOGRAPHS:  AP and lateral views of the right knee reveal valgus deformity, end-stage arthritis tricompartmentally.  IMPRESSION:  End-stage arthritis of the right knee.  PLAN:  Right total knee arthroplasty to be performed by Dr. Lequita Halt.     Rozell Searing, PAC   ______________________________ Ollen Gross, M.D.    LD/MEDQ  D:   01/04/2011  T:  01/04/2011  Job:  [id number]  Electronically Signed by Ollen Gross M.D. on 01/06/2011 12:56:06 PM Electronically Signed by Rozell Searing  on 01/07/2011 07:57:28 AM

## 2011-01-07 LAB — CBC
HCT: 36 % — ABNORMAL LOW (ref 39.0–52.0)
Hemoglobin: 12.3 g/dL — ABNORMAL LOW (ref 13.0–17.0)
MCHC: 34.2 g/dL (ref 30.0–36.0)
RBC: 4.39 MIL/uL (ref 4.22–5.81)
WBC: 15.7 10*3/uL — ABNORMAL HIGH (ref 4.0–10.5)

## 2011-01-09 ENCOUNTER — Inpatient Hospital Stay (HOSPITAL_COMMUNITY): Payer: No Typology Code available for payment source

## 2011-02-24 NOTE — Discharge Summary (Addendum)
[name], [name]                ACCOUNT NO.:  [id number]  MEDICAL RECORD NO.:  [id number]  LOCATION:  1605                         FACILITY:  [facility]  PHYSICIAN:  Ollen Gross, M.D.    DATE OF BIRTH:  23-Aug-1934  DATE OF ADMISSION:  01/04/2011 DATE OF DISCHARGE:  01/10/2011                              DISCHARGE SUMMARY   ADMITTING DIAGNOSES: 1. Osteoarthritis, right knee. 2. History of bronchitis. 3. Cataracts. 4. Hypertension. 5. Hyperlipidemia. 6. Varicose veins. 7. Hiatal hernia. 8. Hemorrhoids. 9. History of hepatitis. 10.Degenerative disk disease. 11.History of bursitis. 12.Childhood illnesses, measles, mumps.  DISCHARGE DIAGNOSES: 1. Osteoarthritis, right knee, status post right total knee     replacement arthroplasty. 2. Hyponatremia. 3. Bilateral shoulder pain. 4. History of bronchitis. 5. Cataracts. 6. Hypertension. 7. Hyperlipidemia. 8. Varicose veins. 9. Hiatal hernia. 10.Hemorrhoids. 11.History of hepatitis. 12.Degenerative disk disease. 13.History of bursitis. 14.Childhood illnesses, measles, mumps.  PROCEDURE:  Jan 04, 2011, right total knee.  SURGEON:  Ollen Gross, M.D.  ASSISTANT:  Rozell Searing, [facility].  ANESTHESIA:  Spinal anesthesia.  TOURNIQUET TIME:  44 minutes.  CONSULTS:  None.  BRIEF HISTORY:  [name] is a 76 year old male who had severe end- stage arthritis of the right knee with severe valgus deformity of the left with a 15 to 20 degree angulation, has progressive worsening pain and dysfunction, has failed nonoperative management, now presents for a total knee arthroplasty.  LABORATORY DATA:  CBC on admission not found in the chart, not scanned in.  Serial CBCs were followed daily though.  Hemoglobin after surgery was 13.3, drifting down.  Last H and H of 12.3 and 3.0.  White count went up from 11 to 15.7.  Chem panel on admission was not found on the chart, not scanned in but the serial BMETs were followed for 2  days. His sodium was low but stable at 131 on postop day #1 and postop day #2. Blood group type A positive.  X-RAYS:  Right shoulder films and significant limited examination with findings suggesting a large chronic rotator cuff tear, degenerative changes noted.  HOSPITAL COURSE:  The patient was admitted to [facility], taken to OR, underwent above-stated procedure without complication.  The patient tolerated the procedure well, later transferred to recovery room on orthopedic floor, started on p.o. and IV analgesic for pain control following surgery.  Had a fair amount of pain through the night of surgery but was doing a little bit better on the morning of day 1.  Had good urinary output.  Sodium was a little low at 131 but rechecked and it did remain stable.  He had good output and started back on his home medications.  By day 2, he was doing a little better with his knees. Sodium was stable.  He was starting to get up with therapy.  We are arranging home health.  By day 3, he was feeling much better with regards to his knees, but unfortunately, the right shoulder had flared up and felt like this was a flare up of his underlying arthritis.  We gave him additional pain meds for that and also a K pad which interfered with his mobility a  little bit, getting up on the walker.  We changed the dressing on day 2 and day 3 and his knee incision was healing well with no signs of infection.  It is felt that he might be ready to go by the next day.  However, postop day 4, the right shoulder pain had gotten much worse and it was interfering with his mobility and there was some question of whether he would be able to go home like this or whether he might need skilled nursing facility, so we got social work involved. The patient did want to go home due to the increased pain.  Dr. Lequita Halt started the patient on a dose pack and we were hoping this would help kick in and help out with the  shoulder pain.  By the next day, postop day #5, he was a little better but still having significant discomfort and was guarded with his mobility.  He was not ready and not met all of his goals, so the following day on Jan 10, 2011, he was doing a little bit better.  The knee was doing well and the steroid dose packet kicked in and his shoulder started to improve.  Once he was able to get up with therapy and was proved to be safe, he was discharged home later that day on Jan 10, 2011.  DISCHARGE PLAN: 1. The patient was discharged home on Jan 10, 2011. 2. Discharge diagnoses, please see above. 3. Discharge medications, Medrol Dosepak, Robaxin, OxyIR, Xarelto.     Continue home meds of amlodipine and lisinopril.  DIET:  Heart-healthy diet.  ACTIVITIES:  Weightbearing as tolerated, total knee protocol.  Follow up in 2 weeks.  DISPOSITION:  Home.  CONDITION ON DISCHARGE:  Improving.     [name], P.A.C.   ______________________________ Ollen Gross, M.D.    ALP/MEDQ  D:  02/16/2011  T:  02/16/2011  Job:  [id number]  Electronically Signed by Ollen Gross M.D. on 02/24/2011 12:31:59 PM Electronically Signed by Marlowe Sax Dimitriy Carreras P.A.C. on 02/25/2011 10:37:41 AM

## 2014-02-11 ENCOUNTER — Other Ambulatory Visit: Payer: Self-pay | Admitting: Geriatric Medicine

## 2014-02-11 DIAGNOSIS — M48061 Spinal stenosis, lumbar region without neurogenic claudication: Secondary | ICD-10-CM

## 2014-02-19 ENCOUNTER — Ambulatory Visit
Admission: RE | Admit: 2014-02-19 | Discharge: 2014-02-19 | Disposition: A | Discharge status: Home / Self Care | Payer: Medicare Other | Source: Ambulatory Visit | Attending: Geriatric Medicine | Admitting: Geriatric Medicine

## 2014-02-19 DIAGNOSIS — M48061 Spinal stenosis, lumbar region without neurogenic claudication: Secondary | ICD-10-CM

## 2014-02-19 MED ORDER — GADOBENATE DIMEGLUMINE 529 MG/ML IV SOLN
20.0000 mL | Freq: Once | INTRAVENOUS | Status: AC | PRN
Start: 2014-02-19 — End: 2014-02-19
  Administered 2014-02-19: 20 mL via INTRAVENOUS

## 2014-07-29 ENCOUNTER — Ambulatory Visit (HOSPITAL_COMMUNITY): Payer: Medicare Other | Attending: Cardiology | Admitting: Cardiology

## 2014-07-29 ENCOUNTER — Other Ambulatory Visit (HOSPITAL_COMMUNITY): Payer: Self-pay | Admitting: Geriatric Medicine

## 2014-07-29 DIAGNOSIS — I4891 Unspecified atrial fibrillation: Secondary | ICD-10-CM | POA: Diagnosis not present

## 2014-07-29 DIAGNOSIS — E785 Hyperlipidemia, unspecified: Secondary | ICD-10-CM | POA: Insufficient documentation

## 2014-07-29 DIAGNOSIS — I10 Essential (primary) hypertension: Secondary | ICD-10-CM | POA: Diagnosis not present

## 2014-07-29 NOTE — Progress Notes (Signed)
Echo performed. 

## 2014-09-17 ENCOUNTER — Encounter: Payer: Self-pay | Admitting: *Deleted

## 2014-09-17 ENCOUNTER — Encounter: Payer: Self-pay | Admitting: Cardiology

## 2014-09-17 ENCOUNTER — Ambulatory Visit (INDEPENDENT_AMBULATORY_CARE_PROVIDER_SITE_OTHER): Payer: PPO | Admitting: Cardiology

## 2014-09-17 VITALS — BP 120/80 | HR 89 | Ht 72.0 in | Wt 254.0 lb

## 2014-09-17 DIAGNOSIS — I420 Dilated cardiomyopathy: Secondary | ICD-10-CM | POA: Insufficient documentation

## 2014-09-17 DIAGNOSIS — I77810 Thoracic aortic ectasia: Secondary | ICD-10-CM

## 2014-09-17 DIAGNOSIS — R0602 Shortness of breath: Secondary | ICD-10-CM

## 2014-09-17 DIAGNOSIS — I4819 Other persistent atrial fibrillation: Secondary | ICD-10-CM

## 2014-09-17 DIAGNOSIS — I481 Persistent atrial fibrillation: Secondary | ICD-10-CM

## 2014-09-17 DIAGNOSIS — I480 Paroxysmal atrial fibrillation: Secondary | ICD-10-CM

## 2014-09-17 DIAGNOSIS — I10 Essential (primary) hypertension: Secondary | ICD-10-CM

## 2014-09-17 HISTORY — DX: Thoracic aortic ectasia: I77.810

## 2014-09-17 HISTORY — DX: Essential (primary) hypertension: I10

## 2014-09-17 HISTORY — DX: Paroxysmal atrial fibrillation: I48.0

## 2014-09-17 HISTORY — DX: Dilated cardiomyopathy: I42.0

## 2014-09-17 LAB — BASIC METABOLIC PANEL
BUN: 22 mg/dL (ref 6–23)
CALCIUM: 9.9 mg/dL (ref 8.4–10.5)
CHLORIDE: 100 meq/L (ref 96–112)
CO2: 30 mEq/L (ref 19–32)
CREATININE: 1.12 mg/dL (ref 0.40–1.50)
GFR: 67.51 mL/min (ref >60.00)
GLUCOSE: 85 mg/dL (ref 70–99)
Potassium: 4.9 mEq/L (ref 3.5–5.1)
Sodium: 135 mEq/L (ref 135–145)

## 2014-09-17 LAB — BRAIN NATRIURETIC PEPTIDE: Pro B Natriuretic peptide (BNP): 251 pg/mL — ABNORMAL HIGH (ref 0.0–100.0)

## 2014-09-17 MED ORDER — LOSARTAN POTASSIUM 25 MG PO TABS: 25.0000 mg | ORAL_TABLET | Freq: Every day | ORAL | Status: DC

## 2014-09-17 MED ORDER — METOPROLOL SUCCINATE ER 25 MG PO TB24: 25.0000 mg | ORAL_TABLET | Freq: Every day | ORAL | Status: DC

## 2014-09-17 NOTE — Patient Instructions (Addendum)
STOP AMIODIPINE   START TAKING LOSARTAN 25 MG ONCE A DAY   START TAKING TOPROL  25 MG ONCE A DAY    LABS TODAY BMET AND BNP  Your physician has requested that you have a lexiscan myoview. For further information please visit [email or web address]. Please follow instruction sheet, as given.   CHEST CT ANGIO  (CAT scanning), is a noninvasive, special x-ray that produces cross-sectional images of the body using x-rays and a computer. CT scans help physicians diagnose and treat medical conditions. For some CT exams, a contrast material is used to enhance visibility in the area of the body being studied. CT scans provide greater clarity and reveal more details than regular x-ray exams.   Your physician has requested that you have an echocardiogram. IN ONE YEAR  Echocardiography is a painless test that uses sound waves to create images of your heart. It provides your doctor with information about the size and shape of your heart and how well your heart's chambers and valves are working. This procedure takes approximately one hour. There are no restrictions for this procedure.   Your physician wants you to follow-up in:  6 MONTHS  You will receive a reminder letter in the mail two months in advance. If you don't receive a letter, please call our office to schedule the follow-up appointment.  Your physician has recommended that you have a Cardioversion (DCCV). Electrical Cardioversion uses a jolt of electricity to your heart either through paddles or wired patches attached to your chest. This is a controlled, usually prescheduled, procedure. Defibrillation is done under light anesthesia in the hospital, and you usually go home the day of the procedure. This is done to get your heart back into a normal rhythm. You are not awake for the procedure. Please see the instruction sheet given to you today.  YOU HAVE BEEN SCHEDULED FOR A CARIOVERSION ON FEB 18 @ 12PM WITH DR [name]   PLEASE ARRIVE AT THE NORTH  TOWER   ENTRANCE @ 1030 AM MAKE SURE YOU HAVE NOTHING TO EAT OR DRINK AFTER MIDNIGHT THE NIGHT BEFORE   HAVE A [employer] DRIVER TO TAKE YOU HOME AFTER YOUR PROCEDURE

## 2014-09-17 NOTE — Progress Notes (Signed)
Cardiology Office Note   Date:  09/17/2014   ID:  TIMM BONENBERGER, DOB [birth year], MRN [id number]  PCP:  Mathews Argyle, MD  Cardiologist:   Sueanne Margarita, MD   No chief complaint on file.     History of Present Illness: [name] is a 80 y.o. male who presents for evaluation of atrial fibrillation.  He was seen by his PCP, Dr. Felipa Eth, on 07/23/2015 and was found to be in new onset atrial fibrillation.  He was started on Eliquis and an echo was done which showed mild LV dysfunction EF 40% and dilated aortic root at 72mm with mild AI.  He says that he has SOB and DOE on occasion especially when going up stairs.  He occasionally has some racing of his heart.  He denies andy dizziness or syncope.  He denies any chest pain or pressure.  He denies any LE edema.      Past Medical History  Diagnosis Date  . Hyperlipidemia   . Hypertension   . DJD (degenerative joint disease)     feet, knees and back  . GERD (gastroesophageal reflux disease)     hiatal hernia  . Obesity   . Cardiovascular risk factor 07/2014    24 %  . Renal cyst, left 2010    5 cm seen on ultrasound   . Lesion of left femoral nerve     3 cm MRI 04/2010 consistent with hemangioma  . PSA (psoriatic arthritis)     Mild increase 3.65 on 05/2011 stable 3.68 on 09/2011  . Back pain   . Atrial fibrillation 07/2014  . Mild aortic insufficiency   . Ejection fraction < 50%     40 % with diffuse  . Hypokinesis     dilated aortic root  . Persistent atrial fibrillation 09/17/2014  . Benign essential HTN 09/17/2014  . Dilated aortic root 09/17/2014  . DCM (dilated cardiomyopathy) 09/17/2014    EF 40%    History reviewed. No pertinent past surgical history.   Current Outpatient Prescriptions  Medication Sig Dispense Refill  . acetaminophen (TYLENOL) 650 MG CR tablet Take 650 mg by mouth every 8 (eight) hours as needed for pain.    Marland Kitchen amLODipine (NORVASC) 5 MG tablet Take 5 mg by mouth daily.    Marland Kitchen apixaban  (ELIQUIS) 5 MG TABS tablet Take 5 mg by mouth 2 (two) times daily.    . Calcium Carb-Cholecalciferol 500-400 MG-UNIT TABS Take 1 tablet by mouth daily.    . Glucosamine-Chondroit-Vit C-Mn (GLUCOSAMINE 1500 COMPLEX) CAPS Take 1 capsule by mouth daily.    Marland Kitchen lisinopril (PRINIVIL,ZESTRIL) 10 MG tablet Take 10 mg by mouth daily.    Marland Kitchen loratadine (CLARITIN) 10 MG tablet Take 10 mg by mouth daily as needed for allergies.    . meloxicam (MOBIC) 15 MG tablet Take 15 mg by mouth daily.    . MULTIPLE VITAMINS PO Take 1 tablet by mouth daily.    . polyethylene glycol (MIRALAX / GLYCOLAX) packet Take 17 g by mouth daily.    . pravastatin (PRAVACHOL) 10 MG tablet Take 10 mg by mouth daily.    . sildenafil (VIAGRA) 100 MG tablet Take 100 mg by mouth daily as needed for erectile dysfunction.     No current facility-administered medications for this visit.    Allergies:   Codeine phosphate; Simvastatin; and Penicillins    Social History:  The patient  reports that he has quit smoking. He does not have any smokeless  tobacco history on file. He reports that he does not drink alcohol or use illicit drugs.   Family History:  The patient's family history includes Atrial fibrillation in his sister; Cancer in his mother; Diabetes in his father; Heart disease in his sister.    ROS:  Please see the history of present illness.   Otherwise, review of systems are positive for none.   All other systems are reviewed and negative.    PHYSICAL EXAM: VS:  BP 120/80 mmHg  Pulse 89  Ht 6' (1.829 m)  Wt 254 lb (115.214 kg)  BMI 34.44 kg/m2 , BMI Body mass index is 34.44 kg/(m^2). GEN: Well nourished, well developed, in no acute distress HEENT: normal Neck: no JVD, carotid bruits, or masses Cardiac:irregularly irregular; no murmurs, rubs, or gallops,no edema  Respiratory:  clear to auscultation bilaterally, normal work of breathing GI: soft, nontender, nondistended, + BS MS: no deformity or atrophy Skin: warm and  dry, no rash Neuro:  Strength and sensation are intact Psych: euthymic mood, full affect   EKG:  EKG is ordered today. The ekg ordered today demonstrates atrial fibrillation at 89bpm with no ST changes   Recent Labs: No results found for requested labs within last 365 days.    Lipid Panel No results found for: CHOL, TRIG, HDL, CHOLHDL, VLDL, LDLCALC, LDLDIRECT    Wt Readings from Last 3 Encounters:  09/17/14 254 lb (115.214 kg)      Other studies Reviewed: Additional studies/ records that were reviewed today include: Office notes from Dr. Felipa Eth.    ASSESSMENT AND PLAN:  1.  Persistent atrial fibrillation with HR 90's.  He occasionally feels his heart racing.  I will change his amlodipine to Toprol XL 25mg  daily for better rate control.  He has not missed any doses of his eliquis so I will set him up for DCCV once stress test done.  Risks and benefits of DCCV reviewed including MI/CVA/death/other arrhythmia/bradycardia and risk of anesthesia and patient understands and agrees to proceed.  2.  HTN well controlled.   3. SOB and DOE most likely secondary to #1 but he also had a mild DCM.  I will check a BNP. 4.  Mild LV dysfunction with EF 40%.  This may be tachycardia induced.  I will get a Lexiscan myoview to rule out ischemia.  Start on low dose BB.  He had a cough on ACE I so I will try low dose Losartan in instead at 25mg  daily.   5.  Dilated aortic root - I would like to check a chest CT angio to assess the rest of his thoracic aorta. If stable will follow with yearly echo.   Current medicines are reviewed at length with the patient today.  The patient does not have concerns regarding medicines.  The following changes have been made:  no change  Labs/ tests ordered today include: lexiscan myoview and BNP/BMET    Disposition:   FU with me in 6  months   Signed, Sueanne Margarita, MD  09/17/2014 12:19 PM    [facility] [place],  [place], [place]  [address] Phone: [phone]; Fax: [phone]

## 2014-09-18 ENCOUNTER — Telehealth: Payer: Self-pay

## 2014-09-18 DIAGNOSIS — I10 Essential (primary) hypertension: Secondary | ICD-10-CM

## 2014-09-18 MED ORDER — FUROSEMIDE 20 MG PO TABS: 20.0000 mg | ORAL_TABLET | Freq: Every day | ORAL | Status: DC

## 2014-09-18 NOTE — Telephone Encounter (Signed)
Instructed patient to START Lasix 20 mg daily. BMET appointment scheduled for next Wednesday.  Patient agrees with treatment plan.

## 2014-09-18 NOTE — Addendum Note (Signed)
Addended by: Sueanne Margarita on: 09/18/2014 09:15 AM   Modules accepted: Miquel Dunn

## 2014-09-18 NOTE — Telephone Encounter (Signed)
-----   Message from Sueanne Margarita, MD sent at 09/17/2014  9:34 PM EST ----- Mildly increased BNP with SOB.  Please add Lasix 20mg  daily and check BMET in 1 week

## 2014-09-19 ENCOUNTER — Ambulatory Visit (INDEPENDENT_AMBULATORY_CARE_PROVIDER_SITE_OTHER)
Admission: RE | Admit: 2014-09-19 | Discharge: 2014-09-19 | Disposition: A | Discharge status: Home / Self Care | Payer: PPO | Source: Ambulatory Visit | Attending: Cardiology | Admitting: Cardiology

## 2014-09-19 ENCOUNTER — Inpatient Hospital Stay: Admission: RE | Admit: 2014-09-19 | Payer: PPO | Source: Ambulatory Visit

## 2014-09-19 DIAGNOSIS — I77810 Thoracic aortic ectasia: Secondary | ICD-10-CM

## 2014-09-19 MED ORDER — IOHEXOL 350 MG/ML SOLN
100.0000 mL | Freq: Once | INTRAVENOUS | Status: AC | PRN
  Administered 2014-09-19: 100 mL via INTRAVENOUS

## 2014-09-20 ENCOUNTER — Encounter: Payer: Self-pay | Admitting: Cardiology

## 2014-09-20 ENCOUNTER — Ambulatory Visit (HOSPITAL_COMMUNITY): Payer: PPO | Attending: Cardiology | Admitting: Radiology

## 2014-09-20 DIAGNOSIS — I77810 Thoracic aortic ectasia: Secondary | ICD-10-CM

## 2014-09-20 DIAGNOSIS — I10 Essential (primary) hypertension: Secondary | ICD-10-CM | POA: Diagnosis not present

## 2014-09-20 DIAGNOSIS — I4891 Unspecified atrial fibrillation: Secondary | ICD-10-CM | POA: Diagnosis not present

## 2014-09-20 DIAGNOSIS — R0609 Other forms of dyspnea: Secondary | ICD-10-CM | POA: Insufficient documentation

## 2014-09-20 DIAGNOSIS — I2510 Atherosclerotic heart disease of native coronary artery without angina pectoris: Secondary | ICD-10-CM

## 2014-09-20 DIAGNOSIS — R0602 Shortness of breath: Secondary | ICD-10-CM

## 2014-09-20 DIAGNOSIS — I481 Persistent atrial fibrillation: Secondary | ICD-10-CM

## 2014-09-20 DIAGNOSIS — I25119 Atherosclerotic heart disease of native coronary artery with unspecified angina pectoris: Secondary | ICD-10-CM | POA: Insufficient documentation

## 2014-09-20 HISTORY — DX: Atherosclerotic heart disease of native coronary artery without angina pectoris: I25.10

## 2014-09-20 MED ORDER — TECHNETIUM TC 99M SESTAMIBI GENERIC - CARDIOLITE
33.0000 | Freq: Once | INTRAVENOUS | Status: AC | PRN
  Administered 2014-09-20: 33 via INTRAVENOUS

## 2014-09-20 MED ORDER — TECHNETIUM TC 99M SESTAMIBI GENERIC - CARDIOLITE
11.0000 | Freq: Once | INTRAVENOUS | Status: AC | PRN
  Administered 2014-09-20: 11 via INTRAVENOUS

## 2014-09-20 MED ORDER — REGADENOSON 0.4 MG/5ML IV SOLN
0.4000 mg | Freq: Once | INTRAVENOUS | Status: AC
  Administered 2014-09-20: 0.4 mg via INTRAVENOUS

## 2014-09-20 NOTE — Progress Notes (Signed)
[facility] [place] [facility] [address] [place], [place] [address] [phone]    Cardiology Nuclear Med Study  [name] is a 80 y.o. male     MRN : [id number]     DOB: Dec 31, 1934  Procedure Date: 09/20/2014  Nuclear Med Background Indication for Stress Test:  Evaluation for Ischemia History:  No known CAD, Afib Cardiac Risk Factors: Hypertension  Symptoms:  DOE and SOB   Nuclear Pre-Procedure Caffeine/Decaff Intake:  None NPO After: 10:00pm   Lungs:  clear O2 Sat: 97% on room air. IV 0.9% NS with Angio Cath:  22g  IV Site: R Hand  IV Started by:  Crissie Figures, RN  Chest Size (in):  46 Cup Size: n/a  Height: 6' (1.829 m)  Weight:  250 lb (113.399 kg)  BMI:  Body mass index is 33.9 kg/(m^2). Tech Comments:  N/A    Nuclear Med Study 1 or 2 day study: 1 day  Stress Test Type:  Lexiscan  Reading MD: N/A  Order Authorizing Provider:  Fransico Him, MD  Resting Radionuclide: Technetium 47m Sestamibi  Resting Radionuclide Dose: 11.0 mCi   Stress Radionuclide:  Technetium 28m Sestamibi  Stress Radionuclide Dose: 33.0 mCi           Stress Protocol Rest HR: 86 Stress HR: 105  Rest BP: 122/74 Stress BP: 85/74  Exercise Time (min): n/a METS: n/a   Predicted Max HR: 143 bpm % Max HR: 73.43 bpm Rate Pressure Product: 12495   Dose of Adenosine (mg):  n/a Dose of Lexiscan: 0.4 mg  Dose of Atropine (mg): n/a Dose of Dobutamine: n/a mcg/kg/min (at max HR)  Stress Test Technologist: Glade Lloyd, BS-ES  Nuclear Technologist:  Earl Many, CNMT     Rest Procedure:  Myocardial perfusion imaging was performed at rest 45 minutes following the intravenous administration of Technetium 38m Sestamibi. Rest ECG: Atrial fibrillation; septal MI.  Stress Procedure:  The patient received IV Lexiscan 0.4 mg over 15-seconds.  Technetium 52m Sestamibi injected at 30-seconds.  Quantitative spect images were obtained after a 45 minute delay.  During the infusion of Lexiscan  the patient complained of SOB, lightheadedness and blurry vision - BP 85/74. Elevated legs and symptoms and BP began to resolve.  Stress ECG: No significant ST segment change suggestive of ischemia.  QPS Raw Data Images:  Acquisition technically good; LVE. Stress Images:  There is decreased uptake in the inferior wall and distal anterior wall/apex. Rest Images:  There is decreased uptake in the inferior wall and distal anterior wall/apex, less prominent compared to the stress images. Subtraction (SDS):  These findings are consistent with prior inferior infarct with mild peri-infarct ischemia and small prior apical infarct with moderate ischemia in the distal anterior wall and apex. Transient Ischemic Dilatation (Normal <1.22):  0.93 Lung/Heart Ratio (Normal <0.45):  0.34  Quantitative Gated Spect Images QGS EDV:  n/a QGS ESV:  n/a  Impression Exercise Capacity:  Lexiscan with no exercise. BP Response:  Hypotensive blood pressure response. Clinical Symptoms:  There is dyspnea. ECG Impression:  No significant ST segment change suggestive of ischemia. Comparison with Prior Nuclear Study: No previous nuclear study performed  Overall Impression:  High risk stress nuclear study with large, severe intensity, partially reversible inferior defect and large, moderate intensity, partially reversible distal anterior/apical defect; findings are consistent with prior inferior infarct with mild peri-infarct ischemia and small prior apical infarct with moderate ischemia in the distal anterior wall and apex. Marland Kitchen  LV Ejection Fraction: Study not gated.  LV Wall Motion:  Study not gated  [employer]

## 2014-09-25 ENCOUNTER — Other Ambulatory Visit: Payer: PPO

## 2014-09-25 ENCOUNTER — Other Ambulatory Visit: Payer: Self-pay

## 2014-09-26 ENCOUNTER — Ambulatory Visit (INDEPENDENT_AMBULATORY_CARE_PROVIDER_SITE_OTHER): Payer: PPO | Admitting: Cardiology

## 2014-09-26 ENCOUNTER — Encounter (HOSPITAL_COMMUNITY): Payer: Self-pay | Admitting: Pharmacy Technician

## 2014-09-26 ENCOUNTER — Encounter: Payer: Self-pay | Admitting: Cardiology

## 2014-09-26 VITALS — BP 130/82 | HR 80 | Ht 72.0 in | Wt 252.8 lb

## 2014-09-26 DIAGNOSIS — I481 Persistent atrial fibrillation: Secondary | ICD-10-CM

## 2014-09-26 DIAGNOSIS — Z01812 Encounter for preprocedural laboratory examination: Secondary | ICD-10-CM

## 2014-09-26 DIAGNOSIS — I4819 Other persistent atrial fibrillation: Secondary | ICD-10-CM

## 2014-09-26 DIAGNOSIS — I420 Dilated cardiomyopathy: Secondary | ICD-10-CM

## 2014-09-26 DIAGNOSIS — I10 Essential (primary) hypertension: Secondary | ICD-10-CM

## 2014-09-26 DIAGNOSIS — I77810 Thoracic aortic ectasia: Secondary | ICD-10-CM

## 2014-09-26 DIAGNOSIS — R9439 Abnormal result of other cardiovascular function study: Secondary | ICD-10-CM

## 2014-09-26 DIAGNOSIS — I2510 Atherosclerotic heart disease of native coronary artery without angina pectoris: Secondary | ICD-10-CM

## 2014-09-26 LAB — CBC WITH DIFFERENTIAL/PLATELET
BASOS ABS: 0.1 10*3/uL (ref 0.0–0.1)
Basophils Relative: 0.6 % (ref 0.0–3.0)
EOS ABS: 0.2 10*3/uL (ref 0.0–0.7)
Eosinophils Relative: 2.4 % (ref 0.0–5.0)
HEMATOCRIT: 51 % (ref 39.0–52.0)
Hemoglobin: 17 g/dL (ref 13.0–17.0)
LYMPHS PCT: 21.4 % (ref 12.0–46.0)
Lymphs Abs: 2.1 10*3/uL (ref 0.7–4.0)
MCHC: 33.3 g/dL (ref 30.0–36.0)
MCV: 81.8 fl (ref 78.0–100.0)
Monocytes Absolute: 1.1 10*3/uL — ABNORMAL HIGH (ref 0.1–1.0)
Monocytes Relative: 11.8 % (ref 3.0–12.0)
NEUTROS PCT: 63.8 % (ref 43.0–77.0)
Neutro Abs: 6.1 10*3/uL (ref 1.4–7.7)
PLATELETS: 225 10*3/uL (ref 150.0–400.0)
RBC: 6.24 Mil/uL — AB (ref 4.22–5.81)
RDW: 15.3 % (ref 11.5–15.5)
WBC: 9.6 10*3/uL (ref 4.0–10.5)

## 2014-09-26 LAB — BASIC METABOLIC PANEL
BUN: 23 mg/dL (ref 6–23)
CHLORIDE: 102 meq/L (ref 96–112)
CO2: 29 meq/L (ref 19–32)
CREATININE: 1.22 mg/dL (ref 0.40–1.50)
Calcium: 10.2 mg/dL (ref 8.4–10.5)
GFR: 61.16 mL/min (ref >60.00)
Glucose, Bld: 89 mg/dL (ref 70–99)
POTASSIUM: 5.2 meq/L — AB (ref 3.5–5.1)
Sodium: 136 mEq/L (ref 135–145)

## 2014-09-26 LAB — PROTIME-INR
INR: 1.2 ratio — ABNORMAL HIGH (ref 0.8–1.0)
Prothrombin Time: 13.4 s — ABNORMAL HIGH (ref 9.6–13.1)

## 2014-09-26 NOTE — Patient Instructions (Signed)
Your physician recommends that you have lab work TODAY (BMET, CBC, PT/INR)  Your physician has requested that you have a cardiac catheterization. Cardiac catheterization is used to diagnose and/or treat various heart conditions. Doctors may recommend this procedure for a number of different reasons. The most common reason is to evaluate chest pain. Chest pain can be a symptom of coronary artery disease (CAD), and cardiac catheterization can show whether plaque is narrowing or blocking your heart's arteries. This procedure is also used to evaluate the valves, as well as measure the blood flow and oxygen levels in different parts of your heart. For further information please visit [email or web address]. Please follow instruction sheet, as given.  Your physician recommends that you schedule a follow-up appointment with Dr. Radford Pax pending the results of your catheterization.

## 2014-09-26 NOTE — Progress Notes (Signed)
Cardiology Office Note   Date:  09/27/2014   ID:  [name], DOB 01/21/1935, MRN [id number]  PCP:  Mathews Argyle, MD  Cardiologist:   Sueanne Margarita, MD   Chief Complaint  Patient presents with  . Atrial Fibrillation  . Cardiomyopathy      History of Present Illness: [name] is a 80 y.o. male who presents for followup of atrial fibrillation. He was seen by his PCP, Dr. Felipa Eth, on 07/23/2015 and was found to be in new onset atrial fibrillation. He was started on Eliquis and an echo was done which showed mild LV dysfunction EF 40% and dilated aortic root at 53mm with mild AI. He was changed from amlodipine to Toprol for better rate control.  A Lexiscan myoview was done which showed a hgh risk stress nuclear study with large, severe intensity, partially reversible inferior defect and large, moderate intensity, partially reversible distal anterior/apical defect; findings are consistent with prior inferior infarct with mild peri-infarct ischemia and small prior apical infarct with moderate ischemia in the distal anterior wall and apex. He now presents back today to discuss the results.      Past Medical History  Diagnosis Date  . Hyperlipidemia   . Hypertension   . DJD (degenerative joint disease)     feet, knees and back  . GERD (gastroesophageal reflux disease)     hiatal hernia  . Obesity   . Cardiovascular risk factor 07/2014    24 %  . Renal cyst, left 2010    5 cm seen on ultrasound   . Lesion of left femoral nerve     3 cm MRI 04/2010 consistent with hemangioma  . PSA (psoriatic arthritis)     Mild increase 3.65 on 05/2011 stable 3.68 on 09/2011  . Back pain   . Atrial fibrillation 07/2014  . Mild aortic insufficiency   . Ejection fraction < 50%     40 % with diffuse  . Hypokinesis     dilated aortic root  . Persistent atrial fibrillation 09/17/2014  . Benign essential HTN 09/17/2014  . Dilated aortic root 09/17/2014  . DCM (dilated cardiomyopathy)  09/17/2014    EF 40%  . Coronary artery calcification seen on CAT scan 09/20/2014    No past surgical history on file.   Current Outpatient Prescriptions  Medication Sig Dispense Refill  . acetaminophen (TYLENOL) 650 MG CR tablet Take 650 mg by mouth every 8 (eight) hours as needed for pain.    Marland Kitchen apixaban (ELIQUIS) 5 MG TABS tablet Take 5 mg by mouth 2 (two) times daily.    . Calcium Carb-Cholecalciferol 500-400 MG-UNIT TABS Take 1 tablet by mouth daily.    . furosemide (LASIX) 20 MG tablet Take 1 tablet (20 mg total) by mouth daily. 30 tablet 3  . Glucosamine-Chondroit-Vit C-Mn (GLUCOSAMINE 1500 COMPLEX) CAPS Take 1 capsule by mouth daily.    Marland Kitchen loratadine (CLARITIN) 10 MG tablet Take 10 mg by mouth daily as needed for allergies.    Marland Kitchen losartan (COZAAR) 25 MG tablet Take 1 tablet (25 mg total) by mouth daily. 30 tablet 5  . meloxicam (MOBIC) 15 MG tablet Take 15 mg by mouth daily.    . metoprolol succinate (TOPROL XL) 25 MG 24 hr tablet Take 1 tablet (25 mg total) by mouth daily. 30 tablet 5  . MULTIPLE VITAMINS PO Take 1 tablet by mouth daily.    . polyethylene glycol (MIRALAX / GLYCOLAX) packet Take 17 g by mouth daily.    Marland Kitchen  pravastatin (PRAVACHOL) 10 MG tablet Take 10 mg by mouth daily.    . sildenafil (VIAGRA) 100 MG tablet Take 100 mg by mouth daily as needed for erectile dysfunction.    Marland Kitchen omega-3 acid ethyl esters (LOVAZA) 1 G capsule Take 1 g by mouth daily.     No current facility-administered medications for this visit.    Allergies:   Codeine phosphate; Simvastatin; and Penicillins    Social History:  The patient  reports that he has quit smoking. He does not have any smokeless tobacco history on file. He reports that he does not drink alcohol or use illicit drugs.   Family History:  The patient's family history includes Atrial fibrillation in his sister; Cancer in his mother; Diabetes in his father; Heart disease in his sister.    ROS:  Please see the history of present  illness.   Otherwise, review of systems are positive for none.   All other systems are reviewed and negative.    PHYSICAL EXAM: VS:  BP 130/82 mmHg  Pulse 80  Ht 6' (1.829 m)  Wt 252 lb 12.8 oz (114.669 kg)  BMI 34.28 kg/m2  SpO2 98% , BMI Body mass index is 34.28 kg/(m^2). GEN: Well nourished, well developed, in no acute distress HEENT: normal Neck: no JVD, carotid bruits, or masses Cardiac: RRR; no murmurs, rubs, or gallops,no edema  Respiratory:  clear to auscultation bilaterally, normal work of breathing GI: soft, nontender, nondistended, + BS MS: no deformity or atrophy Skin: warm and dry, no rash Neuro:  Strength and sensation are intact Psych: euthymic mood, full affect   EKG:  EKG is not ordered today.    Recent Labs: 09/17/2014: Pro B Natriuretic peptide (BNP) 251.0* 09/26/2014: BUN 23; Creatinine 1.22; Hemoglobin 17.0; Platelets 225.0; Potassium 5.2*; Sodium 136    Lipid Panel No results found for: CHOL, TRIG, HDL, CHOLHDL, VLDL, LDLCALC, LDLDIRECT    Wt Readings from Last 3 Encounters:  09/26/14 252 lb 12.8 oz (114.669 kg)  09/20/14 250 lb (113.399 kg)  09/17/14 254 lb (115.214 kg)     ASSESSMENT AND PLAN:  1. Persistent atrial fibrillation with HR 90's. He occasionally feels his heart racing.  HR controlled today on Toprol.  He is on Eliquis but it will need to be held for cath starting Saturday 2/13 2. HTN well controlled. Continue Losartan and Toprol 3. SOB and DOE most likely secondary to #1 but he also had a mild DCM.  Nuclear stress test was high risk with large, severe intensity, partially reversible inferior defect and large, moderate intensity, partially reversible distal anterior/apical defect; findings are consistent with prior inferior infarct with mild peri-infarct ischemia and small prior apical infarct with moderate ischemia in the distal anterior wall and apex. I have discussed the results of stress test with the patient and recommended that  before we proceed with DCCV, we need to proceed with left heart catheterization to evaluate coronary anatomy. Cardiac catheterization was discussed with the patient fully including risks on myocardial infarction, death, stroke, bleeding, arrhythmia, dye allergy, renal insufficiency or bleeding.  All patient questions and concerns were discussed and the patient understands and is willing to proceed.   4. Mild LV dysfunction with EF 40%. This may be tachycardia induced but in light of abnormal stress test will proceed with cath as stated above.  Continue low dose BB/low dose ARB 5. Dilated aortic root at 45mm - follow with yearly echo.      Current medicines are reviewed at  length with the patient today.  The patient does not have concerns regarding medicines.  The following changes have been made:  no change  Labs/ tests ordered today include: cardiac cath  Orders Placed This Encounter  Procedures  . Basic Metabolic Panel (BMET)  . CBC w/Diff  . INR/PT     Disposition:   FU with me in after cath   Signed, Sueanne Margarita, MD  09/27/2014 2:40 PM    [facility] [place], [place], [place]  [address] Phone: [phone]; Fax: [phone]

## 2014-10-01 ENCOUNTER — Encounter (HOSPITAL_COMMUNITY): Payer: Self-pay | Admitting: Cardiovascular Disease

## 2014-10-01 ENCOUNTER — Encounter (HOSPITAL_COMMUNITY)
Admission: RE | Disposition: A | Discharge status: Home / Self Care | Payer: Self-pay | Source: Ambulatory Visit | Attending: Cardiovascular Disease

## 2014-10-01 ENCOUNTER — Ambulatory Visit (HOSPITAL_COMMUNITY)
Admission: RE | Admit: 2014-10-01 | Discharge: 2014-10-01 | Disposition: A | Discharge status: Home / Self Care | Payer: PPO | Source: Ambulatory Visit | Attending: Cardiovascular Disease | Admitting: Cardiovascular Disease

## 2014-10-01 DIAGNOSIS — I10 Essential (primary) hypertension: Secondary | ICD-10-CM | POA: Diagnosis not present

## 2014-10-01 DIAGNOSIS — E785 Hyperlipidemia, unspecified: Secondary | ICD-10-CM | POA: Diagnosis not present

## 2014-10-01 DIAGNOSIS — M179 Osteoarthritis of knee, unspecified: Secondary | ICD-10-CM | POA: Diagnosis not present

## 2014-10-01 DIAGNOSIS — Z87891 Personal history of nicotine dependence: Secondary | ICD-10-CM | POA: Insufficient documentation

## 2014-10-01 DIAGNOSIS — I420 Dilated cardiomyopathy: Secondary | ICD-10-CM | POA: Diagnosis not present

## 2014-10-01 DIAGNOSIS — K219 Gastro-esophageal reflux disease without esophagitis: Secondary | ICD-10-CM | POA: Insufficient documentation

## 2014-10-01 DIAGNOSIS — E669 Obesity, unspecified: Secondary | ICD-10-CM | POA: Diagnosis not present

## 2014-10-01 DIAGNOSIS — I481 Persistent atrial fibrillation: Secondary | ICD-10-CM | POA: Insufficient documentation

## 2014-10-01 DIAGNOSIS — I2510 Atherosclerotic heart disease of native coronary artery without angina pectoris: Secondary | ICD-10-CM | POA: Diagnosis not present

## 2014-10-01 HISTORY — PX: LEFT HEART CATHETERIZATION WITH CORONARY ANGIOGRAM: SHX5451

## 2014-10-01 SURGERY — LEFT HEART CATHETERIZATION WITH CORONARY ANGIOGRAM

## 2014-10-01 MED ORDER — SODIUM CHLORIDE 0.9 % IJ SOLN
3.0000 mL | Freq: Two times a day (BID) | INTRAMUSCULAR | Status: DC
  Administered 2014-10-01: 3 mL via INTRAVENOUS

## 2014-10-01 MED ORDER — ASPIRIN 81 MG PO CHEW
CHEWABLE_TABLET | ORAL | Status: AC
  Filled 2014-10-01: qty 1

## 2014-10-01 MED ORDER — MIDAZOLAM HCL 2 MG/2ML IJ SOLN
INTRAMUSCULAR | Status: AC
  Filled 2014-10-01: qty 2

## 2014-10-01 MED ORDER — ASPIRIN EC 81 MG PO TBEC: 81.0000 mg | DELAYED_RELEASE_TABLET | Freq: Every day | ORAL | Status: DC

## 2014-10-01 MED ORDER — SODIUM CHLORIDE 0.9 % IV SOLN
INTRAVENOUS | Status: DC
  Administered 2014-10-01: 08:00:00 via INTRAVENOUS

## 2014-10-01 MED ORDER — LIDOCAINE HCL (PF) 1 % IJ SOLN
INTRAMUSCULAR | Status: AC
  Filled 2014-10-01: qty 30

## 2014-10-01 MED ORDER — FENTANYL CITRATE 0.05 MG/ML IJ SOLN
INTRAMUSCULAR | Status: AC
  Filled 2014-10-01: qty 2

## 2014-10-01 MED ORDER — ACETAMINOPHEN 325 MG PO TABS: 650.0000 mg | ORAL_TABLET | ORAL | Status: DC | PRN

## 2014-10-01 MED ORDER — SODIUM CHLORIDE 0.9 % IV SOLN: 250.0000 mL | INTRAVENOUS | Status: DC | PRN

## 2014-10-01 MED ORDER — ONDANSETRON HCL 4 MG/2ML IJ SOLN: 4.0000 mg | Freq: Four times a day (QID) | INTRAMUSCULAR | Status: DC | PRN

## 2014-10-01 MED ORDER — HEPARIN (PORCINE) IN NACL 2-0.9 UNIT/ML-% IJ SOLN
INTRAMUSCULAR | Status: AC
  Filled 2014-10-01: qty 1000

## 2014-10-01 MED ORDER — NITROGLYCERIN 1 MG/10 ML FOR IR/CATH LAB
INTRA_ARTERIAL | Status: AC
  Filled 2014-10-01: qty 10

## 2014-10-01 MED ORDER — SODIUM CHLORIDE 0.9 % IJ SOLN: 3.0000 mL | INTRAMUSCULAR | Status: DC | PRN

## 2014-10-01 MED ORDER — ASPIRIN 81 MG PO CHEW
81.0000 mg | CHEWABLE_TABLET | ORAL | Status: AC
  Administered 2014-10-01: 81 mg via ORAL

## 2014-10-01 NOTE — CV Procedure (Signed)
[name] is a 80 y.o. male   [id number]  [id number] LOCATION:  FACILITY: [facility]  PHYSICIAN: Troy Sine, MD, [facility] April 29, 1935   DATE OF PROCEDURE:  10/01/2014     CARDIAC CATHETERIZATION    HISTORY:    [name] is a 80 year old gentleman who has persistent atrial fibrillation since December 2016.  A Lexiscan Myoview study was interpreted as high risk and demonstrated a large severely intense, partially reversible inferior defect and a large moderately intense, partially reversible distal anterior/apical defect.  The patient's anticoagulation has been held for 2 days then he presents now for cardiac catheterization with possible percutaneous coronary intervention if necessary.   PROCEDURE: Left heart catheterization via the right radial approach: Coronary angiography, left ventriculography.  The patient was brought to the second floor [facility] cath lab in the postabsorptive state. The patient was premedicated with Versed 3 mg and fentanyl 75 mcg. A right radial approach was utilized after an Allen's test verified adequate circulation. The right radial artery was punctured via the Seldinger technique, and a 6 [place] Glidesheath Slender was inserted without difficulty.  A radial cocktail consisting of Verapamil, IV nitroglycerin, and lidocaine was administered. Weight adjusted heparin was administered. A safety J wire was advanced into the ascending aorta. Diagnostic catheterization was done with a 5 [place] TIG 4.0, and EBU 3.5 catheters. A 5 French pigtail catheter was used for left ventriculography. A TR radial band was applied for hemostasis. The patient left the catheterization laboratory in stable condition.   HEMODYNAMICS:   Central Aorta: 114/79  Left Ventricle: 114/19  ANGIOGRAPHY:   The left main coronary artery had ostial calcification with smooth narrowing ostially of 30%.  The left main bifurcated into the LAD and left circumflex coronary  artery.  The LAD was a moderate size vessel that gave rise to small very proximal first diagonal vessel.  The second diagonal vessel was moderate size and had 70-80% ostial narrowing.  The LAD supplied a third diagonal vessel which was small caliber as well as several septal perforating arteries and extended to the apex.  There was minimal luminal irregularity but no otherwise significant obstructive disease.  The left circumflex coronary artery was a moderate size vessel that had smooth 30-40% proximal narrowing prior to the takeoff of the first marginal branch.  The RCA was a very large caliber dominant vessel that has 40% mid narrowing.  There are several areas of 20% narrowing distally vessel ended in a very large PLA vessel which extended to the apex posteriorly.  Left ventriculography revealed moderate global LV dysfunction with an ejection fraction of no greater than 40%.     IMPRESSION:  Moderate LV dysfunction with an ejection fraction of 40%.  Mild coronary obstructive disease with calcification of the left main with 30% smooth ostial lowering; 70-80% ostial stenosis of the second diagonal branch of the LAD with mild luminal irregularities of the LAD; 30-40% proximal left circumflex coronary artery; 40% mid RCA with segmental 20% distal stenoses.  RECOMMENDATION:  Initial medical therapy for his CAD.  Anticoagulation will be reinstituted.  The patient will be followed by Dr. Radford Pax with ultimate plans for DC cardioversion of his atrial fibrillation.   Troy Sine, MD, [facility] 10/01/2014 10:41 AM

## 2014-10-01 NOTE — H&P (View-Only) (Signed)
Cardiology Office Note   Date:  09/27/2014   ID:  [name], DOB 1934-09-14, MRN [id number]  PCP:  Mathews Argyle, MD  Cardiologist:   Sueanne Margarita, MD   Chief Complaint  Patient presents with  . Atrial Fibrillation  . Cardiomyopathy      History of Present Illness: [name] is a 80 y.o. male who presents for followup of atrial fibrillation. He was seen by his PCP, Dr. Felipa Eth, on 07/23/2015 and was found to be in new onset atrial fibrillation. He was started on Eliquis and an echo was done which showed mild LV dysfunction EF 40% and dilated aortic root at 44mm with mild AI. He was changed from amlodipine to Toprol for better rate control.  A Lexiscan myoview was done which showed a hgh risk stress nuclear study with large, severe intensity, partially reversible inferior defect and large, moderate intensity, partially reversible distal anterior/apical defect; findings are consistent with prior inferior infarct with mild peri-infarct ischemia and small prior apical infarct with moderate ischemia in the distal anterior wall and apex. He now presents back today to discuss the results.      Past Medical History  Diagnosis Date  . Hyperlipidemia   . Hypertension   . DJD (degenerative joint disease)     feet, knees and back  . GERD (gastroesophageal reflux disease)     hiatal hernia  . Obesity   . Cardiovascular risk factor 07/2014    24 %  . Renal cyst, left 2010    5 cm seen on ultrasound   . Lesion of left femoral nerve     3 cm MRI 04/2010 consistent with hemangioma  . PSA (psoriatic arthritis)     Mild increase 3.65 on 05/2011 stable 3.68 on 09/2011  . Back pain   . Atrial fibrillation 07/2014  . Mild aortic insufficiency   . Ejection fraction < 50%     40 % with diffuse  . Hypokinesis     dilated aortic root  . Persistent atrial fibrillation 09/17/2014  . Benign essential HTN 09/17/2014  . Dilated aortic root 09/17/2014  . DCM (dilated cardiomyopathy)  09/17/2014    EF 40%  . Coronary artery calcification seen on CAT scan 09/20/2014    No past surgical history on file.   Current Outpatient Prescriptions  Medication Sig Dispense Refill  . acetaminophen (TYLENOL) 650 MG CR tablet Take 650 mg by mouth every 8 (eight) hours as needed for pain.    Marland Kitchen apixaban (ELIQUIS) 5 MG TABS tablet Take 5 mg by mouth 2 (two) times daily.    . Calcium Carb-Cholecalciferol 500-400 MG-UNIT TABS Take 1 tablet by mouth daily.    . furosemide (LASIX) 20 MG tablet Take 1 tablet (20 mg total) by mouth daily. 30 tablet 3  . Glucosamine-Chondroit-Vit C-Mn (GLUCOSAMINE 1500 COMPLEX) CAPS Take 1 capsule by mouth daily.    Marland Kitchen loratadine (CLARITIN) 10 MG tablet Take 10 mg by mouth daily as needed for allergies.    Marland Kitchen losartan (COZAAR) 25 MG tablet Take 1 tablet (25 mg total) by mouth daily. 30 tablet 5  . meloxicam (MOBIC) 15 MG tablet Take 15 mg by mouth daily.    . metoprolol succinate (TOPROL XL) 25 MG 24 hr tablet Take 1 tablet (25 mg total) by mouth daily. 30 tablet 5  . MULTIPLE VITAMINS PO Take 1 tablet by mouth daily.    . polyethylene glycol (MIRALAX / GLYCOLAX) packet Take 17 g by mouth daily.    Marland Kitchen  pravastatin (PRAVACHOL) 10 MG tablet Take 10 mg by mouth daily.    . sildenafil (VIAGRA) 100 MG tablet Take 100 mg by mouth daily as needed for erectile dysfunction.    Marland Kitchen omega-3 acid ethyl esters (LOVAZA) 1 G capsule Take 1 g by mouth daily.     No current facility-administered medications for this visit.    Allergies:   Codeine phosphate; Simvastatin; and Penicillins    Social History:  The patient  reports that he has quit smoking. He does not have any smokeless tobacco history on file. He reports that he does not drink alcohol or use illicit drugs.   Family History:  The patient's family history includes Atrial fibrillation in his sister; Cancer in his mother; Diabetes in his father; Heart disease in his sister.    ROS:  Please see the history of present  illness.   Otherwise, review of systems are positive for none.   All other systems are reviewed and negative.    PHYSICAL EXAM: VS:  BP 130/82 mmHg  Pulse 80  Ht 6' (1.829 m)  Wt 252 lb 12.8 oz (114.669 kg)  BMI 34.28 kg/m2  SpO2 98% , BMI Body mass index is 34.28 kg/(m^2). GEN: Well nourished, well developed, in no acute distress HEENT: normal Neck: no JVD, carotid bruits, or masses Cardiac: RRR; no murmurs, rubs, or gallops,no edema  Respiratory:  clear to auscultation bilaterally, normal work of breathing GI: soft, nontender, nondistended, + BS MS: no deformity or atrophy Skin: warm and dry, no rash Neuro:  Strength and sensation are intact Psych: euthymic mood, full affect   EKG:  EKG is not ordered today.    Recent Labs: 09/17/2014: Pro B Natriuretic peptide (BNP) 251.0* 09/26/2014: BUN 23; Creatinine 1.22; Hemoglobin 17.0; Platelets 225.0; Potassium 5.2*; Sodium 136    Lipid Panel No results found for: CHOL, TRIG, HDL, CHOLHDL, VLDL, LDLCALC, LDLDIRECT    Wt Readings from Last 3 Encounters:  09/26/14 252 lb 12.8 oz (114.669 kg)  09/20/14 250 lb (113.399 kg)  09/17/14 254 lb (115.214 kg)     ASSESSMENT AND PLAN:  1. Persistent atrial fibrillation with HR 90's. He occasionally feels his heart racing.  HR controlled today on Toprol.  He is on Eliquis but it will need to be held for cath starting Saturday 2/13 2. HTN well controlled. Continue Losartan and Toprol 3. SOB and DOE most likely secondary to #1 but he also had a mild DCM.  Nuclear stress test was high risk with large, severe intensity, partially reversible inferior defect and large, moderate intensity, partially reversible distal anterior/apical defect; findings are consistent with prior inferior infarct with mild peri-infarct ischemia and small prior apical infarct with moderate ischemia in the distal anterior wall and apex. I have discussed the results of stress test with the patient and recommended that  before we proceed with DCCV, we need to proceed with left heart catheterization to evaluate coronary anatomy. Cardiac catheterization was discussed with the patient fully including risks on myocardial infarction, death, stroke, bleeding, arrhythmia, dye allergy, renal insufficiency or bleeding.  All patient questions and concerns were discussed and the patient understands and is willing to proceed.   4. Mild LV dysfunction with EF 40%. This may be tachycardia induced but in light of abnormal stress test will proceed with cath as stated above.  Continue low dose BB/low dose ARB 5. Dilated aortic root at 54mm - follow with yearly echo.      Current medicines are reviewed at  length with the patient today.  The patient does not have concerns regarding medicines.  The following changes have been made:  no change  Labs/ tests ordered today include: cardiac cath  Orders Placed This Encounter  Procedures  . Basic Metabolic Panel (BMET)  . CBC w/Diff  . INR/PT     Disposition:   FU with me in after cath   Signed, Sueanne Margarita, MD  09/27/2014 2:40 PM    [facility] [place], [place], [place]  [address] Phone: [phone]; Fax: [phone]

## 2014-10-01 NOTE — Interval H&P Note (Signed)
Cath Lab Visit (complete for each Cath Lab visit)  Clinical Evaluation Leading to the Procedure:   ACS: No.  Non-ACS:    Anginal Classification: CCS III  Anti-ischemic medical therapy: Maximal Therapy (2 or more classes of medications)  Non-Invasive Test Results: High-risk stress test findings: cardiac mortality >3%/year  Prior CABG: No previous CABG      History and Physical Interval Note:  10/01/2014 8:56 AM  [name]  has presented today for surgery, with the diagnosis of abnormal stress test  The various methods of treatment have been discussed with the patient and family. After consideration of risks, benefits and other options for treatment, the patient has consented to  Procedure(s): LEFT HEART CATHETERIZATION WITH CORONARY ANGIOGRAM (N/Snow) as Snow surgical intervention .  The patient's history has been reviewed, patient examined, no change in status, stable for surgery.  I have reviewed the patient's chart and labs.  Questions were answered to the patient's satisfaction.     [name]

## 2014-10-01 NOTE — Discharge Instructions (Signed)
Radial Site Care °Refer to this sheet in the next few weeks. These instructions provide you with information on caring for yourself after your procedure. Your caregiver may also give you more specific instructions. Your treatment has been planned according to current medical practices, but problems sometimes occur. Call your caregiver if you have any problems or questions after your procedure. °HOME CARE INSTRUCTIONS °· You may shower the day after the procedure. Remove the bandage (dressing) and gently wash the site with plain soap and water. Gently pat the site dry. °· Do not apply powder or lotion to the site. °· Do not submerge the affected site in water for 3 to 5 days. °· Inspect the site at least twice daily. °· Do not flex or bend the affected arm for 24 hours. °· No lifting over 5 pounds (2.3 kg) for 5 days after your procedure. °· Do not drive home if you are discharged the same day of the procedure. Have someone else drive you. °· You may drive 24 hours after the procedure unless otherwise instructed by your caregiver. °· Do not operate machinery or power tools for 24 hours. °· A responsible adult should be with you for the first 24 hours after you arrive home. °What to expect: °· Any bruising will usually fade within 1 to 2 weeks. °· Blood that collects in the tissue (hematoma) may be painful to the touch. It should usually decrease in size and tenderness within 1 to 2 weeks. °SEEK IMMEDIATE MEDICAL CARE IF: °· You have unusual pain at the radial site. °· You have redness, warmth, swelling, or pain at the radial site. °· You have drainage (other than a small amount of blood on the dressing). °· You have chills. °· You have a fever or persistent symptoms for more than 72 hours. °· You have a fever and your symptoms suddenly get worse. °· Your arm becomes pale, cool, tingly, or numb. °· You have heavy bleeding from the site. Hold pressure on the site. °Document Released: 09/04/2010 Document Revised:  10/25/2011 Document Reviewed: 09/04/2010 °ExitCare® Patient Information ©2015 [employer]. This information is not intended to replace advice given to you by your health care provider. Make sure you discuss any questions you have with your health care provider. ° °

## 2014-10-01 NOTE — Progress Notes (Signed)
Called and spoke with Rennis Harding, RN in reference to patient's 5.2 K+.  As of now, no orders to recheck lab.  Will continue to get patient ready for cath lab.

## 2014-10-02 ENCOUNTER — Other Ambulatory Visit: Payer: Self-pay

## 2014-10-02 ENCOUNTER — Telehealth: Payer: Self-pay

## 2014-10-02 DIAGNOSIS — I77810 Thoracic aortic ectasia: Secondary | ICD-10-CM

## 2014-10-02 NOTE — Telephone Encounter (Addendum)
Instructed patient to restart Eliquis today. 4 week appointment scheduled for 3/17. Informed patient cardioversion will be discussed at visit. Patient agrees with treatment plan.  Called and spoke with Central Scheduling and cancelled DCCV tomorrow.

## 2014-10-02 NOTE — Telephone Encounter (Signed)
Ok to restart Eliquis today.  He will need followup with me in 4 weeks and if still in afib will set up for DCCV

## 2014-10-02 NOTE — Telephone Encounter (Signed)
Patient asking when to resume Eliquis after catheterization yesterday.   To Dr. Radford Pax.

## 2014-10-03 ENCOUNTER — Ambulatory Visit (HOSPITAL_COMMUNITY): Admission: RE | Admit: 2014-10-03 | Payer: PPO | Source: Ambulatory Visit | Admitting: Cardiovascular Disease

## 2014-10-03 ENCOUNTER — Encounter (HOSPITAL_COMMUNITY): Admission: RE | Payer: Self-pay | Source: Ambulatory Visit

## 2014-10-03 SURGERY — CARDIOVERSION
Anesthesia: Monitor Anesthesia Care

## 2014-10-07 ENCOUNTER — Telehealth: Payer: Self-pay | Admitting: Cardiology

## 2014-10-07 NOTE — Telephone Encounter (Signed)
Patient c/o constant SOB. He st this is a chronic problem, but has gotten noticeably worse recently.  He st he cannot sleep at night because it is difficult for him to lie down. He has also been wheezing.  Patient st he has had no weight gain and does not think he is swollen. He has taken all medications as directed. Patient has no other symptoms but st he can feel his heart fluttering every once in a while.  To Dr. Radford Pax for recommendations.

## 2014-10-07 NOTE — Telephone Encounter (Signed)
New message      Pt c/o Shortness Of Breath: STAT if SOB developed within the last 24 hours or pt is noticeably SOB on the phone  1. Are you currently SOB (can you hear that pt is SOB on the phone)?no 2. How long have you been experiencing SOB? Every since he has had afib  3. Are you SOB when sitting or when up moving around? mostly when he moves around  4. Are you currently experiencing any other symptoms? no

## 2014-10-07 NOTE — Telephone Encounter (Signed)
Please have him come in to see the extender 

## 2014-10-08 ENCOUNTER — Encounter: Payer: Self-pay | Admitting: Physician Assistant

## 2014-10-08 ENCOUNTER — Ambulatory Visit (INDEPENDENT_AMBULATORY_CARE_PROVIDER_SITE_OTHER): Payer: PPO | Admitting: Physician Assistant

## 2014-10-08 VITALS — BP 125/80 | HR 88 | Ht 72.0 in | Wt 262.0 lb

## 2014-10-08 DIAGNOSIS — I5023 Acute on chronic systolic (congestive) heart failure: Secondary | ICD-10-CM

## 2014-10-08 DIAGNOSIS — I429 Cardiomyopathy, unspecified: Secondary | ICD-10-CM

## 2014-10-08 DIAGNOSIS — I77810 Thoracic aortic ectasia: Secondary | ICD-10-CM

## 2014-10-08 DIAGNOSIS — I2510 Atherosclerotic heart disease of native coronary artery without angina pectoris: Secondary | ICD-10-CM

## 2014-10-08 DIAGNOSIS — I481 Persistent atrial fibrillation: Secondary | ICD-10-CM

## 2014-10-08 DIAGNOSIS — I10 Essential (primary) hypertension: Secondary | ICD-10-CM

## 2014-10-08 DIAGNOSIS — E785 Hyperlipidemia, unspecified: Secondary | ICD-10-CM

## 2014-10-08 DIAGNOSIS — I4819 Other persistent atrial fibrillation: Secondary | ICD-10-CM

## 2014-10-08 DIAGNOSIS — R0602 Shortness of breath: Secondary | ICD-10-CM

## 2014-10-08 LAB — BASIC METABOLIC PANEL
BUN: 29 mg/dL — AB (ref 6–23)
CO2: 29 mEq/L (ref 19–32)
CREATININE: 1.02 mg/dL (ref 0.40–1.50)
Calcium: 9.6 mg/dL (ref 8.4–10.5)
Chloride: 103 mEq/L (ref 96–112)
GFR: 75.19 mL/min (ref >60.00)
GLUCOSE: 81 mg/dL (ref 70–99)
POTASSIUM: 4.1 meq/L (ref 3.5–5.1)
Sodium: 137 mEq/L (ref 135–145)

## 2014-10-08 LAB — BRAIN NATRIURETIC PEPTIDE: Pro B Natriuretic peptide (BNP): 513 pg/mL — ABNORMAL HIGH (ref 0.0–100.0)

## 2014-10-08 MED ORDER — FUROSEMIDE 20 MG PO TABS: 40.0000 mg | ORAL_TABLET | Freq: Every day | ORAL | Status: DC

## 2014-10-08 NOTE — Telephone Encounter (Signed)
Spoke to [employer], flex today. Patient added today at 1130. Patient agrees with treatment plan.

## 2014-10-08 NOTE — Patient Instructions (Signed)
TAKE LASIX 40 MG WHEN YOU GET HOME TODAY THEN 40 MG TWICE DAILY ON 2/24 AND 2/25  STARTING 10/11/14 YOU WILL RESUME LASIX 40 MG DAILY  YOU WILL NEED AN APPT THIS Friday EITHER WITH Truitt Merle, NP OR FLEX SCHEDULE YOU WILL NEED REPEAT LAB WORK ON Friday   LAB WORK TODAY; BMET, BNP  KEEP YOUR APPT WITH DR. Radford Pax 10/31/14

## 2014-10-08 NOTE — Progress Notes (Signed)
Cardiology Office Note   Date:  10/08/2014   ID:  [name], DOB 1935/04/28, MRN [id number]  PCP:  Mathews Argyle, MD  Cardiologist:  Dr. Fransico Him     Chief Complaint  Patient presents with  . Shortness of Breath     History of Present Illness: [name] is a 80 y.o. male with a hx of persistent AFib, NICM with EF 40%, dilated aortic root, HTN, HL.  He is being considered for DCCV.  However, recent stress test was abnormal prompting LHC on 2/16.  This demonstrated mild to mod non-obs CAD and medical Rx was recommended.  He called in yesterday with increasing dyspnea and was added on for evaluation.  He has noted worsening dyspnea over the last several days. He is now NYHA 2b-3.  He notes orthopnea and PND.  Denies LE edema or increased abdominal girth.  He notes chest heaviness with lying down or activity.  He notes increased dyspnea with bending over.  Denies syncope.    Studies/Reports Reviewed Today:  Cardiac Cath/PCI 10/01/14 LM:  Ostial 30% LAD:  Lum Irregs; Ostial D2 70-80% LCx:  Proximal 30-40% RCA:  Mid 40%, distal 20% EF:  EF 40% Med Rx   Myoview 09/23/14 High risk Large, severe intensity, partially reversible inferior defect and large, moderate intensity, partially reversible distal anterior/apical defect; findings are consistent with prior inferior infarct with mild peri-infarct ischemia and small prior apical infarct with moderate ischemia in the distal anterior wall and apex.    Echocardiogram 07/2014 - Mild LVH. EF 40%. Diffuse HK.  - Mild AI - Aorta: Dilated aortic root and ascending aorta. (Root 46 mm, Ascending aorta 42 mm) - Mod LAE - Mild RVE.  Normal RVF. - Mild RAE - PA peak pressure: 34 mm Hg (S).   Past Medical History  Diagnosis Date  . Hyperlipidemia   . Hypertension   . DJD (degenerative joint disease)     feet, knees and back  . GERD (gastroesophageal reflux disease)     hiatal hernia  . Obesity   . Cardiovascular  risk factor 07/2014    24 %  . Renal cyst, left 2010    5 cm seen on ultrasound   . Lesion of left femoral nerve     3 cm MRI 04/2010 consistent with hemangioma  . PSA (psoriatic arthritis)     Mild increase 3.65 on 05/2011 stable 3.68 on 09/2011  . Back pain   . Atrial fibrillation 07/2014  . Mild aortic insufficiency   . Ejection fraction < 50%     40 % with diffuse  . Hypokinesis     dilated aortic root  . Persistent atrial fibrillation 09/17/2014  . Benign essential HTN 09/17/2014  . Dilated aortic root 09/17/2014  . DCM (dilated cardiomyopathy) 09/17/2014    EF 40%  . Coronary artery calcification seen on CAT scan 09/20/2014    Past Surgical History  Procedure Laterality Date  . Left heart catheterization with coronary angiogram N/A 10/01/2014    Procedure: LEFT HEART CATHETERIZATION WITH CORONARY ANGIOGRAM;  Surgeon: Troy Sine, MD;  Location: [facility] CATH [facility];  Service: Cardiovascular;  Laterality: N/A;     Current Outpatient Prescriptions  Medication Sig Dispense Refill  . acetaminophen (TYLENOL) 650 MG CR tablet Take 650 mg by mouth every 8 (eight) hours as needed for pain.    Marland Kitchen apixaban (ELIQUIS) 5 MG TABS tablet Take 5 mg by mouth 2 (two) times daily.    Marland Kitchen  Calcium Carb-Cholecalciferol 500-400 MG-UNIT TABS Take 1 tablet by mouth daily.    . furosemide (LASIX) 20 MG tablet Take 1 tablet (20 mg total) by mouth daily. 30 tablet 3  . Glucosamine-Chondroit-Vit C-Mn (GLUCOSAMINE 1500 COMPLEX) CAPS Take 1 capsule by mouth daily.    Marland Kitchen loratadine (CLARITIN) 10 MG tablet Take 10 mg by mouth daily as needed for allergies.    Marland Kitchen losartan (COZAAR) 25 MG tablet Take 1 tablet (25 mg total) by mouth daily. 30 tablet 5  . meloxicam (MOBIC) 15 MG tablet Take 15 mg by mouth daily.    . metoprolol succinate (TOPROL XL) 25 MG 24 hr tablet Take 1 tablet (25 mg total) by mouth daily. 30 tablet 5  . MULTIPLE VITAMINS PO Take 1 tablet by mouth daily.    Marland Kitchen omega-3 acid ethyl esters (LOVAZA) 1 G capsule  Take 1 g by mouth daily.    . polyethylene glycol (MIRALAX / GLYCOLAX) packet Take 17 g by mouth daily.    . pravastatin (PRAVACHOL) 10 MG tablet Take 10 mg by mouth daily.    . sildenafil (VIAGRA) 100 MG tablet Take 100 mg by mouth daily as needed for erectile dysfunction.     No current facility-administered medications for this visit.    Allergies:   Codeine phosphate; Simvastatin; and Penicillins    Social History:  The patient  reports that he has quit smoking. He does not have any smokeless tobacco history on file. He reports that he does not drink alcohol or use illicit drugs.   Family History:  The patient's family history includes Atrial fibrillation in his sister; Cancer in his mother; Diabetes in his father; Heart attack in his maternal grandmother, paternal grandfather, and paternal grandmother; Heart disease in his sister; Stroke in his maternal grandfather.    ROS:   Please see the history of present illness.   Review of Systems  Constitution: Positive for diaphoresis.  Cardiovascular: Positive for chest pain and palpitations.  Respiratory: Positive for shortness of breath and wheezing.   All other systems reviewed and are negative.    PHYSICAL EXAM: VS:  BP 125/80 mmHg  Pulse 88  Ht 6' (1.829 m)  Wt 262 lb (118.842 kg)  BMI 35.53 kg/m2    Wt Readings from Last 3 Encounters:  10/08/14 262 lb (118.842 kg)  09/26/14 252 lb 12.8 oz (114.669 kg)  09/20/14 250 lb (113.399 kg)     GEN: Well nourished, well developed, in no acute distress HEENT: normal Neck: + JVD, no masses Cardiac:  Normal S1/S2, irreg irreg rhythm; no murmur, no rubs or gallops, trace edema  Respiratory:  bibasilar rales, no wheezing or rhonchi. GI: soft, nontender, nondistended, + BS MS: no deformity or atrophy Skin: warm and dry  Neuro:  CNs II-XII intact, Strength and sensation are intact Psych: Normal affect Ext:  right wrist without hematoma or mass   EKG:  EKG is ordered today.  It  demonstrates:   AFib, HR 88, no change from prior tracing   Recent Labs: 09/17/2014: Pro B Natriuretic peptide (BNP) 251.0* 09/26/2014: BUN 23; Creatinine 1.22; Hemoglobin 17.0; Platelets 225.0; Potassium 5.2*; Sodium 136    Lipid Panel No results found for: CHOL, TRIG, HDL, CHOLHDL, VLDL, LDLCALC, LDLDIRECT    ASSESSMENT AND PLAN:  1.  Acute on Chronic Systolic CHF:  He has evidence of volume excess.  He is NYHA 2b-3 and has bibasilar rales on exam.  His AFib has likely contributed to his cardiomyopathy and restoring  NSR seems to be important.  However, his Eliquis was recently held for 48 hours for his cardiac cath.  He resumed this the evening of his LHC.  He needs diuresis.      -  Increase Lasix to 40 mg bid x 3 days, then continue on 40 mg QD.    -  BMET, BNP today.    -  Repeat BMET in 3 days.  He is not on K+ (recent K+ high normal).    -  Bring back for close FU Friday of this week. 2.  Persistent Atrial Fibrillation:  Rate controlled.  He is tolerating Eliquis.  As noted, his DCM is likely related to his AFib.  He is currently volume overloaded.  I reviewed his case with Dr. Fransico Him regarding whether to proceed with TEE-DCCV or adding Amiodarone to maintain NSR.    -  Diurese as noted above.     -  If symptoms are improved at FU on Friday 2/26, will plan on continuing medical Rx until he sees Dr. Radford Pax on 3/17.  DCCV can be arranged at that time.    -  If dyspnea is not better at FU on 2/26, will need to arrange TEE-DCCV next week with Dr. Fransico Him or one of her partners.      -  Will hold off on starting Amiodarone for now (off anticoagulation for 48 hours recently).    -  Consider sleep testing in the future.  STOP-Bang=5 (high risk for OSA). 3.  Non-Ischemic CM:  Probably related to AFib (?tachycardia mediated).  Continue ARB, beta blocker.  Will need repeat echo after restoring NSR. 4.  CAD:  Non-obstructive by recent LHC.  He is not on ASA as he is on Eliquis.   Continue statin.  5.  HTN:  Controlled on current regimen.   6.  Hyperlipidemia:  Continue Lovaza, statin.   7.  Dilated Ascending Aorta/Aortic Root:  Repeat Echo due in 07/2015.     Current medicines are reviewed at length with the patient today.  The patient does not have concerns regarding medicines.  The following changes have been made:  As above.   Labs/ tests ordered today include:   Orders Placed This Encounter  Procedures  . Basic Metabolic Panel (BMET)  . B Nat Peptide  . Basic Metabolic Panel (BMET)  . EKG 12-Lead    Disposition:   FU with Dr. Fransico Him or one of the PAs/NPs Friday 2/26.  Keep OV with Dr. Fransico Him 3/17.     Signed, Versie Starks, MHS 10/08/2014 11:55 AM    [facility] [place], [place], [place]  [address] Phone: [phone]; Fax: [phone]

## 2014-10-09 ENCOUNTER — Telehealth: Payer: Self-pay | Admitting: *Deleted

## 2014-10-09 NOTE — Telephone Encounter (Signed)
pt notified about lab results. Pt states he does not want to start the K+, he would like to just eat a banana daily and will have repeat bmet Friday 2/26 at his ov with [place] on Friday. I said I will let Brynda Rim. PA know of this. Pt said thank you.

## 2014-10-11 ENCOUNTER — Other Ambulatory Visit (INDEPENDENT_AMBULATORY_CARE_PROVIDER_SITE_OTHER): Payer: PPO | Admitting: *Deleted

## 2014-10-11 ENCOUNTER — Ambulatory Visit (INDEPENDENT_AMBULATORY_CARE_PROVIDER_SITE_OTHER): Payer: PPO | Admitting: Cardiology

## 2014-10-11 ENCOUNTER — Encounter: Payer: Self-pay | Admitting: Cardiology

## 2014-10-11 VITALS — BP 128/74 | HR 68 | Ht 72.0 in | Wt 252.6 lb

## 2014-10-11 DIAGNOSIS — E669 Obesity, unspecified: Secondary | ICD-10-CM

## 2014-10-11 DIAGNOSIS — I5043 Acute on chronic combined systolic (congestive) and diastolic (congestive) heart failure: Secondary | ICD-10-CM | POA: Insufficient documentation

## 2014-10-11 DIAGNOSIS — I420 Dilated cardiomyopathy: Secondary | ICD-10-CM

## 2014-10-11 DIAGNOSIS — I5023 Acute on chronic systolic (congestive) heart failure: Secondary | ICD-10-CM

## 2014-10-11 DIAGNOSIS — I4819 Other persistent atrial fibrillation: Secondary | ICD-10-CM

## 2014-10-11 DIAGNOSIS — Z7901 Long term (current) use of anticoagulants: Secondary | ICD-10-CM

## 2014-10-11 DIAGNOSIS — I481 Persistent atrial fibrillation: Secondary | ICD-10-CM

## 2014-10-11 LAB — BASIC METABOLIC PANEL
BUN: 30 mg/dL — ABNORMAL HIGH (ref 6–23)
CO2: 33 mEq/L — ABNORMAL HIGH (ref 19–32)
Calcium: 10.1 mg/dL (ref 8.4–10.5)
Chloride: 100 mEq/L (ref 96–112)
Creatinine, Ser: 1.14 mg/dL (ref 0.40–1.50)
GFR: 66.14 mL/min (ref >60.00)
Glucose, Bld: 76 mg/dL (ref 70–99)
Potassium: 4.9 mEq/L (ref 3.5–5.1)
Sodium: 137 mEq/L (ref 135–145)

## 2014-10-11 NOTE — Assessment & Plan Note (Signed)
Persistent AF- rate controlled

## 2014-10-11 NOTE — Patient Instructions (Signed)
Your physician recommends that you continue on your current medications as directed. Please refer to the Current Medication list given to you today.  Lab Today: Bmet  Ok to take an additional lasix for swelling or shortness of breath  Follow up as planned with Dr.[name] in March

## 2014-10-11 NOTE — Progress Notes (Signed)
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 He has had CHF felt to be secondary to AF. He was recently cathed secondary to a high risk Myoview. This revealed mild to moderate CAD. It is felt his CM and CHF is secondary to AF. His Eliquis was interrupted for his cath so he cannot be cardioverted without TEE for 4 weeks. Richardson Dopp saw him 10/08/14 with increasing dyspnea and orthopnea. His Lasix was increased for a few days and he is here today for f/iu. Since his Lasix was increased he has felt better, less DOE and he says he slept through the night last night. He is back on his usual dose of Lasix.    Current Outpatient Prescriptions  Medication Sig Dispense Refill  . acetaminophen (TYLENOL) 650 MG CR tablet Take 650 mg by mouth every 8 (eight) hours as needed for pain.    Marland Kitchen apixaban (ELIQUIS) 5 MG TABS tablet Take 5 mg by mouth 2 (two) times daily.    . Calcium Carb-Cholecalciferol 500-400 MG-UNIT TABS Take 1 tablet by mouth daily.    . furosemide (LASIX) 20 MG tablet Take 2 tablets (40 mg total) by mouth daily. 60 tablet 11  . Glucosamine-Chondroit-Vit C-Mn (GLUCOSAMINE 1500 COMPLEX) CAPS Take 1 capsule by mouth daily.    Marland Kitchen loratadine (CLARITIN) 10 MG tablet Take 10 mg by mouth daily as needed for allergies.    Marland Kitchen losartan (COZAAR) 25 MG tablet Take 1 tablet (25 mg total) by mouth daily. 30 tablet 5  . meloxicam (MOBIC) 15 MG tablet Take 15 mg by mouth daily.    . metoprolol succinate (TOPROL XL) 25 MG 24 hr tablet Take 1 tablet (25 mg total) by mouth daily. 30 tablet 5  . MULTIPLE VITAMINS PO Take 1 tablet by mouth daily.    Marland Kitchen omega-3 acid ethyl esters (LOVAZA) 1 G capsule Take 1 g by mouth daily.    . polyethylene glycol (MIRALAX / GLYCOLAX) packet Take 17 g by mouth daily.    . pravastatin (PRAVACHOL) 10 MG tablet Take 10 mg by mouth daily.    .  sildenafil (VIAGRA) 100 MG tablet Take 100 mg by mouth daily as needed for erectile dysfunction.     No current facility-administered medications for this visit.    Allergies  Allergen Reactions  . Simvastatin Other (See Comments)    Muscle pain elevated CPK  . Codeine Phosphate Other (See Comments)    constipation  . Penicillins Rash    History   Social History  . Marital Status: Married    Spouse Name: N/A  . Number of Children: 4  . Years of Education: N/A   Occupational History  . [employer]    Social History Main Topics  . Smoking status: Former Research scientist (life sciences)  . Smokeless tobacco: Not on file  . Alcohol Use: No  . Drug Use: No  . Sexual Activity: Not on file   Other Topics Concern  . Not on file   Social History Narrative     Review of Systems: General: negative for chills, fever, night sweats or weight changes.  Cardiovascular: negative for chest pain, dyspnea on exertion, edema, orthopnea, palpitations, paroxysmal nocturnal dyspnea or shortness of breath Dermatological: negative for rash Respiratory: negative for cough or wheezing Urologic: negative for hematuria Abdominal: negative for nausea, vomiting, diarrhea, bright red blood per  rectum, melena, or hematemesis Neurologic: negative for visual changes, syncope, or dizziness All other systems reviewed and are otherwise negative except as noted above.    Blood pressure 128/74, pulse 68, height 6' (1.829 m), weight 252 lb 9.6 oz (114.579 kg), SpO2 97 %.  General appearance: alert, cooperative, no distress and morbidly obese Lungs: clear to auscultation bilaterally Heart: irregularly irregular rhythm Extremities: no edema   ASSESSMENT AND PLAN:   Acute on chronic combined systolic and diastolic congestive heart failure Seen in f/u from his 10/08/14 OV- he is better, slept through the night, less DOE   Chronic anticoagulation-Eliquis Interrupted for coronary angiogram 10/01/14   CAD- mild 30-40%  after high risk Myoview Cath 10/01/14   DCM (dilated cardiomyopathy) EF 40% by echo Dec 2015   Obesity (BMI 30-39.9) Suspected sleep apnea   Persistent atrial fibrillation Persistent AF- rate controlled    PLAN  He will continue his current Rx. If he has increasing wgt and dyspnea he knows he can double his Lasix for two days. He is to see Dr Radford Pax later in March to be set up for OP DCCV. I did order a BMP.   Digby Groeneveld KPA-C 10/11/2014 11:23 AM

## 2014-10-11 NOTE — Assessment & Plan Note (Signed)
EF 40% by echo Dec 2015

## 2014-10-11 NOTE — Assessment & Plan Note (Signed)
Suspected sleep apnea

## 2014-10-11 NOTE — Assessment & Plan Note (Signed)
Interrupted for coronary angiogram 10/01/14

## 2014-10-11 NOTE — Assessment & Plan Note (Signed)
Cath 10/01/14

## 2014-10-11 NOTE — Assessment & Plan Note (Signed)
Seen in f/u from his 10/08/14 OV- he is better, slept through the night, less DOE

## 2014-10-30 ENCOUNTER — Encounter: Payer: Self-pay | Admitting: Cardiology

## 2014-10-30 NOTE — Progress Notes (Signed)
Cardiology Office Note   Date:  10/31/2014   ID:  [name], DOB 09-21-1934, MRN [id number]  PCP:  Mathews Argyle, MD  Cardiologist:   Sueanne Margarita, MD   Chief Complaint  Patient presents with  . Coronary Artery Disease  . Hypertension  . Congestive Heart Failure  . Atrial Fibrillation      History of Present Illness: [name] is a 80 y.o. male who presents for followup of atrial fibrillation. He was seen by his PCP, Dr. Felipa Eth, on 07/23/2015 and was found to be in new onset atrial fibrillation. He was started on Eliquis and an echo was done which showed mild LV dysfunction EF 40% and dilated aortic root at 46mm with mild AI. He was changed from amlodipine to Toprol for better rate control. A Lexiscan myoview was done which showed a hgh risk stress nuclear study with large, severe intensity, partially reversible inferior defect and large, moderate intensity, partially reversible distal anterior/apical defect; findings are consistent with prior inferior infarct with mild peri-infarct ischemia and small prior apical infarct with moderate ischemia in the distal anterior wall and apex. He underwent cardiac cath showing 30% LM at the ostium, 70-80% ostial D2, 30-40% prox LCX, 30-40% prox and 40% mid RCA and EF 40%.  He now presents today for followup.  He is doing well.  He was restarted on Eliquis with plans for DCCV after 4 weeks.  He was seen by the PA 10/08/2014 with increased SOB and his lasix was increased for a few days which improved his symptoms.  He still has some SOB but it is improved.  He denies any chest pain, dizziness or syncope.   He occasionally notices some palpitations.      Past Medical History  Diagnosis Date  . Hyperlipidemia   . DJD (degenerative joint disease)     feet, knees and back  . GERD (gastroesophageal reflux disease)     hiatal hernia  . Obesity   . Renal cyst, left 2010    5 cm seen on ultrasound   . Lesion of left femoral  nerve     3 cm MRI 04/2010 consistent with hemangioma  . PSA (psoriatic arthritis)     Mild increase 3.65 on 05/2011 stable 3.68 on 09/2011  . Back pain   . Mild aortic insufficiency   . Persistent atrial fibrillation 09/17/2014  . Dilated aortic root 09/17/2014  . DCM (dilated cardiomyopathy) 09/17/2014    EF 40%  . CAD (coronary artery disease), native coronary artery 09/20/2014    30% LM at the ostium, 70-80% ostial D2, 30-40% prox LCX, 30-40% prox and 40% mid RCA and EF 40%.    . Benign essential HTN 09/17/2014    Past Surgical History  Procedure Laterality Date  . Left heart catheterization with coronary angiogram N/A 10/01/2014    Procedure: LEFT HEART CATHETERIZATION WITH CORONARY ANGIOGRAM;  Surgeon: Troy Sine, MD;  Location: [facility] CATH LAB;  Service: Cardiovascular;  Laterality: N/A;     Current Outpatient Prescriptions  Medication Sig Dispense Refill  . acetaminophen (TYLENOL) 650 MG CR tablet Take 650 mg by mouth every 8 (eight) hours as needed for pain.    Marland Kitchen apixaban (ELIQUIS) 5 MG TABS tablet Take 5 mg by mouth 2 (two) times daily.    . Calcium Carb-Cholecalciferol 500-400 MG-UNIT TABS Take 1 tablet by mouth daily.    . furosemide (LASIX) 20 MG tablet Take 2 tablets (40 mg total) by mouth daily.  60 tablet 11  . Glucosamine-Chondroit-Vit C-Mn (GLUCOSAMINE 1500 COMPLEX) CAPS Take 1 capsule by mouth daily.    Marland Kitchen loratadine (CLARITIN) 10 MG tablet Take 10 mg by mouth daily as needed for allergies.    Marland Kitchen losartan (COZAAR) 25 MG tablet Take 1 tablet (25 mg total) by mouth daily. 30 tablet 5  . meloxicam (MOBIC) 15 MG tablet Take 15 mg by mouth daily.    . metoprolol succinate (TOPROL XL) 25 MG 24 hr tablet Take 1 tablet (25 mg total) by mouth daily. 30 tablet 5  . MULTIPLE VITAMINS PO Take 1 tablet by mouth daily.    Marland Kitchen omega-3 acid ethyl esters (LOVAZA) 1 G capsule Take 1 g by mouth daily.    . polyethylene glycol (MIRALAX / GLYCOLAX) packet Take 17 g by mouth as needed.     .  pravastatin (PRAVACHOL) 10 MG tablet Take 10 mg by mouth daily.    . sildenafil (VIAGRA) 100 MG tablet Take 100 mg by mouth daily as needed for erectile dysfunction.     No current facility-administered medications for this visit.    Allergies:   Simvastatin; Codeine phosphate; and Penicillins    Social History:  The patient  reports that he has quit smoking. He does not have any smokeless tobacco history on file. He reports that he does not drink alcohol or use illicit drugs.   Family History:  The patient's family history includes Atrial fibrillation in his sister; Cancer in his mother; Diabetes in his father; Heart attack in his maternal grandmother, paternal grandfather, and paternal grandmother; Heart disease in his sister; Stroke in his maternal grandfather.    ROS:  Please see the history of present illness.   Otherwise, review of systems are positive for none.   All other systems are reviewed and negative.    PHYSICAL EXAM: VS:  BP 120/62 mmHg  Pulse 72  Ht 6' (1.829 m)  Wt 256 lb 1.9 oz (116.175 kg)  BMI 34.73 kg/m2 , BMI Body mass index is 34.73 kg/(m^2). GEN: Well nourished, well developed, in no acute distress HEENT: normal Neck: no JVD, carotid bruits, or masses Cardiac: RRR; no murmurs, rubs, or gallops,no edema  Respiratory:  clear to auscultation bilaterally, normal work of breathing GI: soft, nontender, nondistended, + BS MS: no deformity or atrophy Skin: warm and dry, no rash Neuro:  Strength and sensation are intact Psych: euthymic mood, full affect   EKG:  EKG is not ordered today.   Recent Labs: 09/26/2014: Hemoglobin 17.0; Platelets 225.0 10/08/2014: Pro B Natriuretic peptide (BNP) 513.0* 10/11/2014: BUN 30*; Creatinine 1.14; Potassium 4.9; Sodium 137    Lipid Panel No results found for: CHOL, TRIG, HDL, CHOLHDL, VLDL, LDLCALC, LDLDIRECT    Wt Readings from Last 3 Encounters:  10/31/14 256 lb 1.9 oz (116.175 kg)  10/11/14 252 lb 9.6 oz (114.579 kg)   10/08/14 262 lb (118.842 kg)     ASSESSMENT AND PLAN:   Chronic combined systolic and diastolic congestive heart failure Improved after increased dose of Lasix.  Appears euvolemic on exam today.  Continue Lasix/ARB/BB  Chronic anticoagulation-Eliquis Interrupted for coronary angiogram 10/01/14.  Now back on Eliquis and will plan DCCV after being back on Eliquis   CAD- mild 30-40% after high risk Myoview Cath 10/01/14.  Continue statin/BB.  Not on ASA due to Eliquis  DCM (dilated cardiomyopathy) EF 40% by echo Dec 2015 and cath 09/2014  Obesity (BMI 30-39.9) Suspected sleep apnea - he does not think that he  has it.  His wife says he does not really snore but has noted him to have some shallow breaths.  I have recommended that we get a split night study.  Persistent atrial fibrillation Persistent AF- rate controlled.  Continue Eliquis and plan DCCV - he has been back on Eliquis for 3 weeks  Current medicines are reviewed at length with the patient today.  The patient does not have concerns regarding medicines.  The following changes have been made:  no change  Labs/ tests ordered today include: None   Orders Placed This Encounter  Procedures  . Basic Metabolic Panel (BMET)  . CBC w/Diff  . INR/PT  . PTT     Disposition:   FU with me after DCCV   Signed, Sueanne Margarita, MD  10/31/2014 2:40 PM    [facility] [place], [place], [place]  [address] Phone: [phone]; Fax: [phone]

## 2014-10-31 ENCOUNTER — Encounter: Payer: Self-pay | Admitting: Cardiology

## 2014-10-31 ENCOUNTER — Ambulatory Visit (INDEPENDENT_AMBULATORY_CARE_PROVIDER_SITE_OTHER): Payer: PPO | Admitting: Cardiology

## 2014-10-31 VITALS — BP 120/62 | HR 72 | Ht 72.0 in | Wt 256.1 lb

## 2014-10-31 DIAGNOSIS — R0602 Shortness of breath: Secondary | ICD-10-CM

## 2014-10-31 DIAGNOSIS — I481 Persistent atrial fibrillation: Secondary | ICD-10-CM

## 2014-10-31 DIAGNOSIS — I2510 Atherosclerotic heart disease of native coronary artery without angina pectoris: Secondary | ICD-10-CM

## 2014-10-31 DIAGNOSIS — Z01812 Encounter for preprocedural laboratory examination: Secondary | ICD-10-CM

## 2014-10-31 DIAGNOSIS — I420 Dilated cardiomyopathy: Secondary | ICD-10-CM

## 2014-10-31 DIAGNOSIS — I4819 Other persistent atrial fibrillation: Secondary | ICD-10-CM

## 2014-10-31 DIAGNOSIS — Z7901 Long term (current) use of anticoagulants: Secondary | ICD-10-CM

## 2014-10-31 DIAGNOSIS — E669 Obesity, unspecified: Secondary | ICD-10-CM

## 2014-10-31 DIAGNOSIS — I10 Essential (primary) hypertension: Secondary | ICD-10-CM

## 2014-10-31 DIAGNOSIS — I77810 Thoracic aortic ectasia: Secondary | ICD-10-CM

## 2014-10-31 LAB — BASIC METABOLIC PANEL
BUN: 41 mg/dL — ABNORMAL HIGH (ref 6–23)
CO2: 32 mEq/L (ref 19–32)
CREATININE: 1.48 mg/dL (ref 0.40–1.50)
Calcium: 9.7 mg/dL (ref 8.4–10.5)
Chloride: 100 mEq/L (ref 96–112)
GFR: 48.93 mL/min — AB (ref >60.00)
GLUCOSE: 82 mg/dL (ref 70–99)
POTASSIUM: 4.3 meq/L (ref 3.5–5.1)
Sodium: 136 mEq/L (ref 135–145)

## 2014-10-31 LAB — CBC WITH DIFFERENTIAL/PLATELET
BASOS PCT: 0.7 % (ref 0.0–3.0)
Basophils Absolute: 0.1 10*3/uL (ref 0.0–0.1)
Eosinophils Absolute: 0.5 10*3/uL (ref 0.0–0.7)
Eosinophils Relative: 4.7 % (ref 0.0–5.0)
HEMATOCRIT: 49.8 % (ref 39.0–52.0)
HEMOGLOBIN: 16.7 g/dL (ref 13.0–17.0)
Lymphocytes Relative: 22.2 % (ref 12.0–46.0)
Lymphs Abs: 2.2 10*3/uL (ref 0.7–4.0)
MCHC: 33.5 g/dL (ref 30.0–36.0)
MCV: 82.5 fl (ref 78.0–100.0)
Monocytes Absolute: 1 10*3/uL (ref 0.1–1.0)
Monocytes Relative: 9.9 % (ref 3.0–12.0)
NEUTROS PCT: 62.5 % (ref 43.0–77.0)
Neutro Abs: 6.2 10*3/uL (ref 1.4–7.7)
PLATELETS: 199 10*3/uL (ref 150.0–400.0)
RBC: 6.03 Mil/uL — ABNORMAL HIGH (ref 4.22–5.81)
RDW: 15.2 % (ref 11.5–15.5)
WBC: 10 10*3/uL (ref 4.0–10.5)

## 2014-10-31 LAB — PROTIME-INR
INR: 1.1 ratio — AB (ref 0.8–1.0)
Prothrombin Time: 12.4 s (ref 9.6–13.1)

## 2014-10-31 LAB — APTT: aPTT: 29.3 s (ref 23.4–32.7)

## 2014-10-31 NOTE — Patient Instructions (Signed)
Your physician has recommended that you have a Cardioversion (DCCV) on Monday, March 21. Electrical Cardioversion uses a jolt of electricity to your heart either through paddles or wired patches attached to your chest. This is a controlled, usually prescheduled, procedure. Defibrillation is done under light anesthesia in the hospital, and you usually go home the day of the procedure. This is done to get your heart back into a normal rhythm. You are not awake for the procedure. Please see the instruction sheet given to you today.  Your physician recommends that you have lab work TODAY (BMET, CBC, PT, INR)  Your physician recommends that you schedule a follow-up appointment 2 weeks after your cardioversion with a PA or NP  Your physician recommends that you schedule a follow-up appointment in: 3 months with Dr. Radford Pax.

## 2014-11-01 ENCOUNTER — Other Ambulatory Visit: Payer: Self-pay | Admitting: Cardiology

## 2014-11-03 NOTE — Anesthesia Preprocedure Evaluation (Addendum)
Anesthesia Evaluation  Patient identified by MRN, date of birth, ID band Patient awake    Reviewed: Allergy & Precautions, NPO status , Patient's Chart, lab work & pertinent test results  Airway Mallampati: I  TM Distance: >3 FB Neck ROM: Full    Dental  (+) Edentulous Upper, Edentulous Lower, Dental Advisory Given   Pulmonary former smoker,    Pulmonary exam normal       Cardiovascular hypertension, Pt. on medications + CAD and + Peripheral Vascular Disease + dysrhythmias Atrial Fibrillation Rhythm:Irregular Rate:Normal  CATH 09/2014 30-40% blockages, done after high risk myoview.  AF, EF 40%   Neuro/Psych negative neurological ROS  negative psych ROS   GI/Hepatic Neg liver ROS, GERD-  Medicated,  Endo/Other  negative endocrine ROS  Renal/GU negative Renal ROS     Musculoskeletal   Abdominal   Peds  Hematology   Anesthesia Other Findings   Reproductive/Obstetrics                          Anesthesia Physical Anesthesia Plan  ASA: III  Anesthesia Plan: General   Post-op Pain Management:    Induction: Intravenous  Airway Management Planned: Simple Face Mask  Additional Equipment:   Intra-op Plan:   Post-operative Plan:   Informed Consent: I have reviewed the patients History and Physical, chart, labs and discussed the procedure including the risks, benefits and alternatives for the proposed anesthesia with the patient or authorized representative who has indicated his/her understanding and acceptance.   Dental advisory given  Plan Discussed with: CRNA, Anesthesiologist and Surgeon  Anesthesia Plan Comments:       Anesthesia Quick Evaluation

## 2014-11-04 ENCOUNTER — Ambulatory Visit (HOSPITAL_COMMUNITY)
Admission: RE | Admit: 2014-11-04 | Discharge: 2014-11-04 | Disposition: A | Discharge status: Home / Self Care | Payer: PPO | Source: Ambulatory Visit | Attending: Cardiology | Admitting: Cardiology

## 2014-11-04 ENCOUNTER — Ambulatory Visit (HOSPITAL_COMMUNITY): Payer: PPO | Admitting: Anesthesiology

## 2014-11-04 ENCOUNTER — Encounter (HOSPITAL_COMMUNITY): Payer: Self-pay | Admitting: *Deleted

## 2014-11-04 ENCOUNTER — Encounter (HOSPITAL_COMMUNITY)
Admission: RE | Disposition: A | Discharge status: Home / Self Care | Payer: Self-pay | Source: Ambulatory Visit | Attending: Cardiology

## 2014-11-04 DIAGNOSIS — Z886 Allergy status to analgesic agent status: Secondary | ICD-10-CM | POA: Diagnosis not present

## 2014-11-04 DIAGNOSIS — I351 Nonrheumatic aortic (valve) insufficiency: Secondary | ICD-10-CM | POA: Insufficient documentation

## 2014-11-04 DIAGNOSIS — I739 Peripheral vascular disease, unspecified: Secondary | ICD-10-CM | POA: Insufficient documentation

## 2014-11-04 DIAGNOSIS — Z87891 Personal history of nicotine dependence: Secondary | ICD-10-CM | POA: Diagnosis not present

## 2014-11-04 DIAGNOSIS — Z888 Allergy status to other drugs, medicaments and biological substances status: Secondary | ICD-10-CM | POA: Diagnosis not present

## 2014-11-04 DIAGNOSIS — I4819 Other persistent atrial fibrillation: Secondary | ICD-10-CM | POA: Diagnosis present

## 2014-11-04 DIAGNOSIS — E785 Hyperlipidemia, unspecified: Secondary | ICD-10-CM | POA: Diagnosis not present

## 2014-11-04 DIAGNOSIS — I10 Essential (primary) hypertension: Secondary | ICD-10-CM | POA: Diagnosis not present

## 2014-11-04 DIAGNOSIS — Z880 Allergy status to penicillin: Secondary | ICD-10-CM | POA: Insufficient documentation

## 2014-11-04 DIAGNOSIS — I2510 Atherosclerotic heart disease of native coronary artery without angina pectoris: Secondary | ICD-10-CM | POA: Diagnosis not present

## 2014-11-04 DIAGNOSIS — L4050 Arthropathic psoriasis, unspecified: Secondary | ICD-10-CM | POA: Diagnosis not present

## 2014-11-04 DIAGNOSIS — I420 Dilated cardiomyopathy: Secondary | ICD-10-CM | POA: Insufficient documentation

## 2014-11-04 DIAGNOSIS — I5042 Chronic combined systolic (congestive) and diastolic (congestive) heart failure: Secondary | ICD-10-CM | POA: Insufficient documentation

## 2014-11-04 DIAGNOSIS — E669 Obesity, unspecified: Secondary | ICD-10-CM | POA: Insufficient documentation

## 2014-11-04 DIAGNOSIS — Z7901 Long term (current) use of anticoagulants: Secondary | ICD-10-CM | POA: Insufficient documentation

## 2014-11-04 DIAGNOSIS — K219 Gastro-esophageal reflux disease without esophagitis: Secondary | ICD-10-CM | POA: Insufficient documentation

## 2014-11-04 DIAGNOSIS — Z6830 Body mass index (BMI) 30.0-30.9, adult: Secondary | ICD-10-CM | POA: Diagnosis not present

## 2014-11-04 DIAGNOSIS — I4891 Unspecified atrial fibrillation: Secondary | ICD-10-CM | POA: Diagnosis present

## 2014-11-04 DIAGNOSIS — I481 Persistent atrial fibrillation: Secondary | ICD-10-CM | POA: Diagnosis not present

## 2014-11-04 DIAGNOSIS — M159 Polyosteoarthritis, unspecified: Secondary | ICD-10-CM | POA: Insufficient documentation

## 2014-11-04 HISTORY — PX: CARDIOVERSION: SHX1299

## 2014-11-04 SURGERY — CARDIOVERSION
Anesthesia: General

## 2014-11-04 MED ORDER — PROPOFOL 10 MG/ML IV BOLUS
INTRAVENOUS | Status: DC | PRN
  Administered 2014-11-04: 60 mg via INTRAVENOUS

## 2014-11-04 MED ORDER — SODIUM CHLORIDE 0.9 % IV SOLN
INTRAVENOUS | Status: DC
  Administered 2014-11-04: 500 mL via INTRAVENOUS

## 2014-11-04 MED ORDER — SODIUM CHLORIDE 0.9 % IV SOLN
INTRAVENOUS | Status: DC | PRN
  Administered 2014-11-04: 12:00:00 via INTRAVENOUS

## 2014-11-04 NOTE — H&P (View-Only) (Signed)
Cardiology Office Note   Date:  10/31/2014   ID:  TAJ NEVINS, DOB 06-14-1935, MRN [id number]  PCP:  Mathews Argyle, MD  Cardiologist:   Sueanne Margarita, MD   Chief Complaint  Patient presents with  . Coronary Artery Disease  . Hypertension  . Congestive Heart Failure  . Atrial Fibrillation      History of Present Illness: [name] is a 80 y.o. male who presents for followup of atrial fibrillation. He was seen by his PCP, Dr. Felipa Eth, on 07/23/2015 and was found to be in new onset atrial fibrillation. He was started on Eliquis and an echo was done which showed mild LV dysfunction EF 40% and dilated aortic root at 44mm with mild AI. He was changed from amlodipine to Toprol for better rate control. A Lexiscan myoview was done which showed a hgh risk stress nuclear study with large, severe intensity, partially reversible inferior defect and large, moderate intensity, partially reversible distal anterior/apical defect; findings are consistent with prior inferior infarct with mild peri-infarct ischemia and small prior apical infarct with moderate ischemia in the distal anterior wall and apex. He underwent cardiac cath showing 30% LM at the ostium, 70-80% ostial D2, 30-40% prox LCX, 30-40% prox and 40% mid RCA and EF 40%.  He now presents today for followup.  He is doing well.  He was restarted on Eliquis with plans for DCCV after 4 weeks.  He was seen by the PA 10/08/2014 with increased SOB and his lasix was increased for a few days which improved his symptoms.  He still has some SOB but it is improved.  He denies any chest pain, dizziness or syncope.   He occasionally notices some palpitations.      Past Medical History  Diagnosis Date  . Hyperlipidemia   . DJD (degenerative joint disease)     feet, knees and back  . GERD (gastroesophageal reflux disease)     hiatal hernia  . Obesity   . Renal cyst, left 2010    5 cm seen on ultrasound   . Lesion of left femoral  nerve     3 cm MRI 04/2010 consistent with hemangioma  . PSA (psoriatic arthritis)     Mild increase 3.65 on 05/2011 stable 3.68 on 09/2011  . Back pain   . Mild aortic insufficiency   . Persistent atrial fibrillation 09/17/2014  . Dilated aortic root 09/17/2014  . DCM (dilated cardiomyopathy) 09/17/2014    EF 40%  . CAD (coronary artery disease), native coronary artery 09/20/2014    30% LM at the ostium, 70-80% ostial D2, 30-40% prox LCX, 30-40% prox and 40% mid RCA and EF 40%.    . Benign essential HTN 09/17/2014    Past Surgical History  Procedure Laterality Date  . Left heart catheterization with coronary angiogram N/A 10/01/2014    Procedure: LEFT HEART CATHETERIZATION WITH CORONARY ANGIOGRAM;  Surgeon: Troy Sine, MD;  Location: [facility] CATH LAB;  Service: Cardiovascular;  Laterality: N/A;     Current Outpatient Prescriptions  Medication Sig Dispense Refill  . acetaminophen (TYLENOL) 650 MG CR tablet Take 650 mg by mouth every 8 (eight) hours as needed for pain.    Marland Kitchen apixaban (ELIQUIS) 5 MG TABS tablet Take 5 mg by mouth 2 (two) times daily.    . Calcium Carb-Cholecalciferol 500-400 MG-UNIT TABS Take 1 tablet by mouth daily.    . furosemide (LASIX) 20 MG tablet Take 2 tablets (40 mg total) by mouth daily.  60 tablet 11  . Glucosamine-Chondroit-Vit C-Mn (GLUCOSAMINE 1500 COMPLEX) CAPS Take 1 capsule by mouth daily.    Marland Kitchen loratadine (CLARITIN) 10 MG tablet Take 10 mg by mouth daily as needed for allergies.    Marland Kitchen losartan (COZAAR) 25 MG tablet Take 1 tablet (25 mg total) by mouth daily. 30 tablet 5  . meloxicam (MOBIC) 15 MG tablet Take 15 mg by mouth daily.    . metoprolol succinate (TOPROL XL) 25 MG 24 hr tablet Take 1 tablet (25 mg total) by mouth daily. 30 tablet 5  . MULTIPLE VITAMINS PO Take 1 tablet by mouth daily.    Marland Kitchen omega-3 acid ethyl esters (LOVAZA) 1 G capsule Take 1 g by mouth daily.    . polyethylene glycol (MIRALAX / GLYCOLAX) packet Take 17 g by mouth as needed.     .  pravastatin (PRAVACHOL) 10 MG tablet Take 10 mg by mouth daily.    . sildenafil (VIAGRA) 100 MG tablet Take 100 mg by mouth daily as needed for erectile dysfunction.     No current facility-administered medications for this visit.    Allergies:   Simvastatin; Codeine phosphate; and Penicillins    Social History:  The patient  reports that he has quit smoking. He does not have any smokeless tobacco history on file. He reports that he does not drink alcohol or use illicit drugs.   Family History:  The patient's family history includes Atrial fibrillation in his sister; Cancer in his mother; Diabetes in his father; Heart attack in his maternal grandmother, paternal grandfather, and paternal grandmother; Heart disease in his sister; Stroke in his maternal grandfather.    ROS:  Please see the history of present illness.   Otherwise, review of systems are positive for none.   All other systems are reviewed and negative.    PHYSICAL EXAM: VS:  BP 120/62 mmHg  Pulse 72  Ht 6' (1.829 m)  Wt 256 lb 1.9 oz (116.175 kg)  BMI 34.73 kg/m2 , BMI Body mass index is 34.73 kg/(m^2). GEN: Well nourished, well developed, in no acute distress HEENT: normal Neck: no JVD, carotid bruits, or masses Cardiac: RRR; no murmurs, rubs, or gallops,no edema  Respiratory:  clear to auscultation bilaterally, normal work of breathing GI: soft, nontender, nondistended, + BS MS: no deformity or atrophy Skin: warm and dry, no rash Neuro:  Strength and sensation are intact Psych: euthymic mood, full affect   EKG:  EKG is not ordered today.   Recent Labs: 09/26/2014: Hemoglobin 17.0; Platelets 225.0 10/08/2014: Pro B Natriuretic peptide (BNP) 513.0* 10/11/2014: BUN 30*; Creatinine 1.14; Potassium 4.9; Sodium 137    Lipid Panel No results found for: CHOL, TRIG, HDL, CHOLHDL, VLDL, LDLCALC, LDLDIRECT    Wt Readings from Last 3 Encounters:  10/31/14 256 lb 1.9 oz (116.175 kg)  10/11/14 252 lb 9.6 oz (114.579 kg)   10/08/14 262 lb (118.842 kg)     ASSESSMENT AND PLAN:   Chronic combined systolic and diastolic congestive heart failure Improved after increased dose of Lasix.  Appears euvolemic on exam today.  Continue Lasix/ARB/BB  Chronic anticoagulation-Eliquis Interrupted for coronary angiogram 10/01/14.  Now back on Eliquis and will plan DCCV after being back on Eliquis   CAD- mild 30-40% after high risk Myoview Cath 10/01/14.  Continue statin/BB.  Not on ASA due to Eliquis  DCM (dilated cardiomyopathy) EF 40% by echo Dec 2015 and cath 09/2014  Obesity (BMI 30-39.9) Suspected sleep apnea - he does not think that he  has it.  His wife says he does not really snore but has noted him to have some shallow breaths.  I have recommended that we get a split night study.  Persistent atrial fibrillation Persistent AF- rate controlled.  Continue Eliquis and plan DCCV - he has been back on Eliquis for 3 weeks  Current medicines are reviewed at length with the patient today.  The patient does not have concerns regarding medicines.  The following changes have been made:  no change  Labs/ tests ordered today include: None   Orders Placed This Encounter  Procedures  . Basic Metabolic Panel (BMET)  . CBC w/Diff  . INR/PT  . PTT     Disposition:   FU with me after DCCV   Signed, Sueanne Margarita, MD  10/31/2014 2:40 PM    [facility] [address], [place], [place]  [address] Phone: [phone]; Fax: [phone]

## 2014-11-04 NOTE — Interval H&P Note (Signed)
History and Physical Interval Note:  11/04/2014 10:37 AM  [name]  has presented today for surgery, with the diagnosis of A FIB   The various methods of treatment have been discussed with the patient and family. After consideration of risks, benefits and other options for treatment, the patient has consented to  Procedure(s): CARDIOVERSION (N/A) as a surgical intervention .  The patient's history has been reviewed, patient examined, no change in status, stable for surgery.  I have reviewed the patient's chart and labs.  Questions were answered to the patient's satisfaction.     Josep Luviano R

## 2014-11-04 NOTE — Discharge Instructions (Signed)
Atrial Fibrillation °Atrial fibrillation is a condition that causes your heart to beat irregularly. It may also cause your heart to beat faster than normal. Atrial fibrillation can prevent your heart from pumping blood normally. It increases your risk of stroke and heart problems. °HOME CARE °· Take medications as told by your doctor. °· Only take medications that your doctor says are safe. Some medications can make the condition worse or happen again. °· If blood thinners were prescribed by your doctor, take them exactly as told. Too much can cause bleeding. Too little and you will not have the needed protection against stroke and other problems. °· Perform blood tests at home if told by your doctor. °· Perform blood tests exactly as told by your doctor. °· Do not drink alcohol. °· Do not drink beverages with caffeine such as coffee, soda, and some teas. °· Maintain a healthy weight. °· Do not use diet pills unless your doctor says they are safe. They may make heart problems worse. °· Follow diet instructions as told by your doctor. °· Exercise regularly as told by your doctor. °· Keep all follow-up appointments. °GET HELP IF: °· You notice a change in the speed, rhythm, or strength of your heartbeat. °· You suddenly begin peeing (urinating) more often. °· You get tired more easily when moving or exercising. °GET HELP RIGHT AWAY IF:  °· You have chest or belly (abdominal) pain. °· You feel sick to your stomach (nauseous). °· You are short of breath. °· You suddenly have swollen feet and ankles. °· You feel dizzy. °· You face, arms, or legs feel numb or weak. °· There is a change in your vision or speech. °MAKE SURE YOU:  °· Understand these instructions. °· Will watch your condition. °· Will get help right away if you are not doing well or get worse. °Document Released: 05/11/2008 Document Revised: 12/17/2013 Document Reviewed: 09/12/2012 °[employer]® Patient Information ©2015 [employer]. This information is not  intended to replace advice given to you by your health care provider. Make sure you discuss any questions you have with your health care provider. °Electrical Cardioversion, Care After °Refer to this sheet in the next few weeks. These instructions provide you with information on caring for yourself after your procedure. Your health care provider may also give you more specific instructions. Your treatment has been planned according to current medical practices, but problems sometimes occur. Call your health care provider if you have any problems or questions after your procedure. °WHAT TO EXPECT AFTER THE PROCEDURE °After your procedure, it is typical to have the following sensations: °· Some redness on the skin where the shocks were delivered. If this is tender, a sunburn lotion or hydrocortisone cream may help. °· Possible return of an abnormal heart rhythm within hours or days after the procedure. °HOME CARE INSTRUCTIONS °· Take medicines only as directed by your health care provider. Be sure you understand how and when to take your medicine. °· Learn how to feel your pulse and check it often. °· Limit your activity for 48 hours after the procedure or as directed by your health care provider. °· Avoid or minimize caffeine and other stimulants as directed by your health care provider. °SEEK MEDICAL CARE IF: °· You feel like your heart is beating too fast or your pulse is not regular. °· You have any questions about your medicines. °· You have bleeding that will not stop. °SEEK IMMEDIATE MEDICAL CARE IF: °· You are dizzy or feel faint. °·   It is hard to breathe or you feel short of breath.  There is a change in discomfort in your chest.  Your speech is slurred or you have trouble moving an arm or leg on one side of your body.  You get a serious muscle cramp that does not go away.  Your fingers or toes turn cold or blue. Document Released: 05/23/2013 Document Revised: 12/17/2013 Document Reviewed:  05/23/2013 [facility] Patient Information 2015 [place], [place]. This information is not intended to replace advice given to you by your health care provider. Make sure you discuss any questions you have with your health care provider.   Conscious Sedation, Adult, Care After Refer to this sheet in the next few weeks. These instructions provide you with information on caring for yourself after your procedure. Your health care provider may also give you more specific instructions. Your treatment has been planned according to current medical practices, but problems sometimes occur. Call your health care provider if you have any problems or questions after your procedure. WHAT TO EXPECT AFTER THE PROCEDURE  After your procedure:  You may feel sleepy, clumsy, and have poor balance for several hours.  Vomiting may occur if you eat too soon after the procedure. HOME CARE INSTRUCTIONS  Do not participate in any activities where you could become injured for at least 24 hours. Do not:  Drive.  Swim.  Ride a bicycle.  Operate heavy machinery.  Cook.  Use power tools.  Climb ladders.  Work from a high place.  Do not make important decisions or sign legal documents until you are improved.  If you vomit, drink water, juice, or soup when you can drink without vomiting. Make sure you have little or no nausea before eating solid foods.  Only take over-the-counter or prescription medicines for pain, discomfort, or fever as directed by your health care provider.  Make sure you and your family fully understand everything about the medicines given to you, including what side effects may occur.  You should not drink alcohol, take sleeping pills, or take medicines that cause drowsiness for at least 24 hours.  If you smoke, do not smoke without supervision.  If you are feeling better, you may resume normal activities 24 hours after you were sedated.  Keep all appointments with your health care  provider. SEEK MEDICAL CARE IF:  Your skin is pale or bluish in color.  You continue to feel nauseous or vomit.  Your pain is getting worse and is not helped by medicine.  You have bleeding or swelling.  You are still sleepy or feeling clumsy after 24 hours. SEEK IMMEDIATE MEDICAL CARE IF:  You develop a rash.  You have difficulty breathing.  You develop any type of allergic problem.  You have a fever. MAKE SURE YOU:  Understand these instructions.  Will watch your condition.  Will get help right away if you are not doing well or get worse. Document Released: 05/23/2013 Document Reviewed: 05/23/2013 [facility] Patient Information 2015 [place], [place]. This information is not intended to replace advice given to you by your health care provider. Make sure you discuss any questions you have with your health care provider.

## 2014-11-04 NOTE — CV Procedure (Signed)
   Electrical Cardioversion Procedure Note [name] [id number] April 17, 1935  Procedure: Electrical Cardioversion Indications:  Atrial Fibrillation  Time Out: Verified patient identification, verified procedure,medications/allergies/relevent history reviewed, required imaging and test results available.  Performed  Procedure Details  The patient was NPO after midnight. Anesthesia was administered at the beside  by Dr.Singer with 60mg  of propofol and 60mg  of Lidocaine IV  Cardioversion was done with synchronized biphasic defibrillation with AP pads with 150watts that was unsuccessful.  Cardioversion was done again with synchronized biphasic defibrillation with AP pads with  200 watts.  The patient converted to normal sinus rhythm. The patient tolerated the procedure well   IMPRESSION:  Successful cardioversion of atrial fibrillation    [name] 11/04/2014, 12:30 PM

## 2014-11-04 NOTE — Transfer of Care (Signed)
Immediate Anesthesia Transfer of Care Note  Patient: [name]  Procedure(s) Performed: Procedure(s): CARDIOVERSION (N/A)  Patient Location: PACU  Anesthesia Type:MAC  Level of Consciousness: awake, alert , patient cooperative and responds to stimulation  Airway & Oxygen Therapy: Patient Spontanous Breathing  Post-op Assessment: Report given to RN and Post -op Vital signs reviewed and stable  Post vital signs: Reviewed and stable  Last Vitals:  Filed Vitals:   11/04/14 1137  BP: 108/74  Temp: 36.7 C  Resp: 17    Complications: No apparent anesthesia complications

## 2014-11-04 NOTE — Anesthesia Postprocedure Evaluation (Signed)
Anesthesia Post Note  Patient: [name]  Procedure(s) Performed: Procedure(s) (LRB): CARDIOVERSION (N/A)  Anesthesia type: general  Patient location: PACU  Post pain: Pain level controlled  Post assessment: Patient's Cardiovascular Status Stable  Last Vitals:  Filed Vitals:   11/04/14 1259  BP: 133/81  Temp:   Resp: 13    Post vital signs: Reviewed and stable  Level of consciousness: sedated  Complications: No apparent anesthesia complications

## 2014-11-05 ENCOUNTER — Encounter (HOSPITAL_COMMUNITY): Payer: Self-pay | Admitting: Cardiology

## 2014-11-20 ENCOUNTER — Ambulatory Visit: Payer: PPO | Admitting: Cardiology

## 2014-11-24 NOTE — Progress Notes (Signed)
Cardiology Office Note   Date:  11/25/2014   ID:  [name], DOB 04/23/1935, MRN [id number]  PCP:  Mathews Argyle, MD  Cardiologist:  Dr. Fransico Him     Chief Complaint  Patient presents with  . Atrial Fibrillation    f/u after DCCV     History of Present Illness: [name] is a 80 y.o. male with a hx of persistent AFib, NICM with EF 40%, dilated aortic root, HTN, HL.  He was being considered for DCCV.  However, recent stress test was abnormal prompting LHC on 2/16.  This demonstrated mild to mod non-obs CAD and medical Rx was recommended.  He was seen in FU in the office and was volume overloaded.  He responded to increased diuresis.  He was ultimately set up for DCCV done on 11/04/14 restoring NSR.    He returns for FU.  He has not had any change in his symptoms since his cardioversion. He remained short of breath with moderate to severe exertion. He is NYHA 2-2b. He denies orthopnea, PND or edema. He feels that his fluid has remained stable. He denies chest pain or syncope. He denies near syncope.  Studies/Reports Reviewed Today:  Cardiac Cath/PCI 10/01/14 LM:  Ostial 30% LAD:  Lum Irregs; Ostial D2 70-80% LCx:  Proximal 30-40% RCA:  Mid 40%, distal 20% EF:  EF 40% Med Rx   Myoview 09/23/14 High risk Large, severe intensity, partially reversible inferior defect and large, moderate intensity, partially reversible distal anterior/apical defect; findings are consistent with prior inferior infarct with mild peri-infarct ischemia and small prior apical infarct with moderate ischemia in the distal anterior wall and apex.    Echocardiogram 07/2014 - Mild LVH. EF 40%. Diffuse HK.  - Mild AI - Aorta: Dilated aortic root and ascending aorta. (Root 46 mm, Ascending aorta 42 mm) - Mod LAE - Mild RVE.  Normal RVF. - Mild RAE - PA peak pressure: 34 mm Hg (S).   Past Medical History  Diagnosis Date  . Hyperlipidemia   . DJD (degenerative joint disease)    feet, knees and back  . GERD (gastroesophageal reflux disease)     hiatal hernia  . Obesity   . Renal cyst, left 2010    5 cm seen on ultrasound   . Lesion of left femoral nerve     3 cm MRI 04/2010 consistent with hemangioma  . PSA (psoriatic arthritis)     Mild increase 3.65 on 05/2011 stable 3.68 on 09/2011  . Back pain   . Mild aortic insufficiency   . Persistent atrial fibrillation 09/17/2014  . Dilated aortic root 09/17/2014  . DCM (dilated cardiomyopathy) 09/17/2014    EF 40%  . CAD (coronary artery disease), native coronary artery 09/20/2014    30% LM at the ostium, 70-80% ostial D2, 30-40% prox LCX, 30-40% prox and 40% mid RCA and EF 40%.    . Benign essential HTN 09/17/2014    Past Surgical History  Procedure Laterality Date  . Left heart catheterization with coronary angiogram N/A 10/01/2014    Procedure: LEFT HEART CATHETERIZATION WITH CORONARY ANGIOGRAM;  Surgeon: Troy Sine, MD;  Location: [facility] CATH LAB;  Service: Cardiovascular;  Laterality: N/A;  . Cardioversion N/A 11/04/2014    Procedure: CARDIOVERSION;  Surgeon: Sueanne Margarita, MD;  Location: [facility] ENDOSCOPY;  Service: Cardiovascular;  Laterality: N/A;     Current Outpatient Prescriptions  Medication Sig Dispense Refill  . acetaminophen (TYLENOL) 650 MG CR tablet Take 650  mg by mouth every 8 (eight) hours as needed for pain.    Marland Kitchen apixaban (ELIQUIS) 5 MG TABS tablet Take 5 mg by mouth 2 (two) times daily.    . Calcium Carb-Cholecalciferol 500-400 MG-UNIT TABS Take 1 tablet by mouth daily.    . furosemide (LASIX) 20 MG tablet Take 2 tablets (40 mg total) by mouth daily. 60 tablet 11  . Glucosamine-Chondroit-Vit C-Mn (GLUCOSAMINE 1500 COMPLEX) CAPS Take 1 capsule by mouth daily.    Marland Kitchen loratadine (CLARITIN) 10 MG tablet Take 10 mg by mouth daily as needed for allergies.    Marland Kitchen losartan (COZAAR) 25 MG tablet Take 1 tablet (25 mg total) by mouth daily. 30 tablet 5  . meloxicam (MOBIC) 15 MG tablet Take 15 mg by mouth daily.    .  metoprolol succinate (TOPROL XL) 25 MG 24 hr tablet Take 1 tablet (25 mg total) by mouth daily. 30 tablet 5  . MULTIPLE VITAMINS PO Take 1 tablet by mouth daily.    Marland Kitchen omega-3 acid ethyl esters (LOVAZA) 1 G capsule Take 1 g by mouth daily.    . polyethylene glycol (MIRALAX / GLYCOLAX) packet Take 17 g by mouth as needed.     . pravastatin (PRAVACHOL) 10 MG tablet Take 10 mg by mouth daily.    . sildenafil (VIAGRA) 100 MG tablet Take 100 mg by mouth daily as needed for erectile dysfunction.     No current facility-administered medications for this visit.    Allergies:   Simvastatin; Codeine phosphate; and Penicillins    Social History:  The patient  reports that he has quit smoking. He does not have any smokeless tobacco history on file. He reports that he does not drink alcohol or use illicit drugs.   Family History:  The patient's family history includes Atrial fibrillation in his sister; Cancer in his mother; Diabetes in his father; Heart attack in his maternal grandmother, paternal grandfather, and paternal grandmother; Heart disease in his sister; Stroke in his maternal grandfather.    ROS:   Please see the history of present illness.   ROS   PHYSICAL EXAM: VS:  BP 110/62 mmHg  Pulse 89  Ht 6' (1.829 m)  Wt 256 lb (116.121 kg)  BMI 34.71 kg/m2    Wt Readings from Last 3 Encounters:  11/25/14 256 lb (116.121 kg)  10/31/14 256 lb 1.9 oz (116.175 kg)  10/11/14 252 lb 9.6 oz (114.579 kg)     GEN: Well nourished, well developed, in no acute distress HEENT: normal Neck: no JVD, no masses Cardiac:  Normal S1/S2, irreg irreg rhythm; no murmur, no rubs or gallops, trace edema  Respiratory:  bibasilar rales, no wheezing or rhonchi. GI: soft, nontender, nondistended, + BS MS: no deformity or atrophy Skin: warm and dry  Neuro:  CNs II-XII intact, Strength and sensation are intact Psych: Normal affect Ext:  right wrist without hematoma or mass   EKG:  EKG is ordered today.  It  demonstrates:   Atrial fibrillation, HR 89  Recent Labs: 10/08/2014: Pro B Natriuretic peptide (BNP) 513.0* 10/31/2014: BUN 41*; Creatinine 1.48; Hemoglobin 16.7; Platelets 199.0; Potassium 4.3; Sodium 136    Lipid Panel No results found for: CHOL, TRIG, HDL, CHOLHDL, VLDL, LDLCALC, LDLDIRECT    ASSESSMENT AND PLAN:  1.  Chronic Combined Systolic and Diastolic CHF:  Volume remain stable. Continue current therapy. 2.  Persistent Atrial Fibrillation:  He is back in atrial fibrillation. He did not have any improvement in symptoms when he  was known to be in normal sinus rhythm after his cardioversion. Overall, he appears to be asymptomatic with atrial fibrillation. His heart rate is fairly well controlled at this point in time. Cardiomyopathy is thought to possibly be related to tachycardia. The fact that he has no apparent symptoms at this point in time, raises the possibility of pursuing a rate control strategy. I will discuss this further with Dr. Radford Pax. At this point, I will increase his Toprol-XL to 25 mg twice a day. I will obtain a 24-hour Holter monitor in about 1 week. This will be to ensure that his rate is controlled. I will discuss with Dr. Radford Pax whether to pursue a follow-up limited echo to reassess LV function versus pursuing rhythm control with amiodarone.  I would suspect that if his LV function improves with guaranteed rate control, we can continue to pursue a rate control strategy. I will also arrange a sleep study as the patient has had witnessed apnea as well as daytime hypersomnolence. 3.  Non-Ischemic CM:  Probably related to AFib (?tachycardia mediated).  Continue ARB, beta blocker.  As noted, I will discuss with Dr. Radford Pax whether to pursue rhythm control and initiate amiodarone versus repeating limited echo to reassess LV function with an eye towards pursuing rate control (if LV function is improved). 4.  CAD:  Non-obstructive by recent LHC.  He is not on ASA as he is on Eliquis.   Continue statin and beta blocker 5.  HTN:  Controlled on current regimen.   6.  Hyperlipidemia:  Continue Lovaza, statin.   7.  Dilated Ascending Aorta/Aortic Root:  Repeat Echo due in 07/2015.     Current medicines are reviewed at length with the patient today.  The patient does not have concerns regarding medicines.  The following changes have been made:  As above.   Labs/ tests ordered today include:  Orders Placed This Encounter  Procedures  . EKG 12-Lead  . Holter monitor - 24 hour  . Split night study    Disposition:   FU with Dr. Fransico Him as planned next month.    Signed, Versie Starks, MHS 11/25/2014 12:12 PM    [address], [place], [place]  [address] Phone: [phone]; Fax: [phone]

## 2014-11-25 ENCOUNTER — Ambulatory Visit (INDEPENDENT_AMBULATORY_CARE_PROVIDER_SITE_OTHER): Payer: PPO | Admitting: Physician Assistant

## 2014-11-25 ENCOUNTER — Encounter: Payer: Self-pay | Admitting: Physician Assistant

## 2014-11-25 VITALS — BP 110/62 | HR 89 | Ht 72.0 in | Wt 256.0 lb

## 2014-11-25 DIAGNOSIS — E785 Hyperlipidemia, unspecified: Secondary | ICD-10-CM

## 2014-11-25 DIAGNOSIS — I77810 Thoracic aortic ectasia: Secondary | ICD-10-CM

## 2014-11-25 DIAGNOSIS — I481 Persistent atrial fibrillation: Secondary | ICD-10-CM

## 2014-11-25 DIAGNOSIS — I429 Cardiomyopathy, unspecified: Secondary | ICD-10-CM

## 2014-11-25 DIAGNOSIS — I10 Essential (primary) hypertension: Secondary | ICD-10-CM

## 2014-11-25 DIAGNOSIS — R0681 Apnea, not elsewhere classified: Secondary | ICD-10-CM

## 2014-11-25 DIAGNOSIS — I5042 Chronic combined systolic (congestive) and diastolic (congestive) heart failure: Secondary | ICD-10-CM | POA: Diagnosis not present

## 2014-11-25 DIAGNOSIS — I4819 Other persistent atrial fibrillation: Secondary | ICD-10-CM

## 2014-11-25 DIAGNOSIS — I2510 Atherosclerotic heart disease of native coronary artery without angina pectoris: Secondary | ICD-10-CM

## 2014-11-25 DIAGNOSIS — I428 Other cardiomyopathies: Secondary | ICD-10-CM

## 2014-11-25 MED ORDER — METOPROLOL SUCCINATE ER 25 MG PO TB24: 25.0000 mg | ORAL_TABLET | Freq: Two times a day (BID) | ORAL | Status: DC

## 2014-11-25 NOTE — Patient Instructions (Signed)
Your physician has recommended that you wear a 24 HOUR holter monitor THIS IS TO BE PUT ON IN 1 WEEK. Holter monitors are medical devices that record the heart's electrical activity. Doctors most often use these monitors to diagnose arrhythmias. Arrhythmias are problems with the speed or rhythm of the heartbeat. The monitor is a small, portable device. You can wear one while you do your normal daily activities. This is usually used to diagnose what is causing palpitations/syncope (passing out).  KEEP YOUR APPT WITH DR. Radford Pax 12/2014  Your physician has recommended that you have a SPLIT NIGHT sleep study. This test records several body functions during sleep, including: brain activity, eye movement, oxygen and carbon dioxide blood levels, heart rate and rhythm, breathing rate and rhythm, the flow of air through your mouth and nose, snoring, body muscle movements, and chest and belly movement.  Your physician has recommended you make the following change in your medication:  1. INCREASE TOPROL XL TO 25 MG TWICE DAILY; NEW RX SENT IN

## 2014-11-29 ENCOUNTER — Encounter: Payer: Self-pay | Admitting: *Deleted

## 2014-11-29 ENCOUNTER — Telehealth: Payer: Self-pay | Admitting: *Deleted

## 2014-11-29 DIAGNOSIS — I420 Dilated cardiomyopathy: Secondary | ICD-10-CM

## 2014-11-29 DIAGNOSIS — I2510 Atherosclerotic heart disease of native coronary artery without angina pectoris: Secondary | ICD-10-CM

## 2014-11-29 DIAGNOSIS — I4819 Other persistent atrial fibrillation: Secondary | ICD-10-CM

## 2014-11-29 NOTE — Telephone Encounter (Signed)
This encounter was created in error - please disregard.

## 2014-11-29 NOTE — Telephone Encounter (Signed)
pt notified that he will need a limited echo the week before his appt with Dr. Radford Pax to assess LV function. Pt agreeable to plan of care. Pt scheduled for limited echo 5/6 and Dr. Radford Pax 5/13.

## 2014-12-02 ENCOUNTER — Encounter (INDEPENDENT_AMBULATORY_CARE_PROVIDER_SITE_OTHER): Payer: PPO

## 2014-12-02 ENCOUNTER — Encounter: Payer: Self-pay | Admitting: Radiology

## 2014-12-02 DIAGNOSIS — I481 Persistent atrial fibrillation: Secondary | ICD-10-CM

## 2014-12-02 DIAGNOSIS — I4819 Other persistent atrial fibrillation: Secondary | ICD-10-CM

## 2014-12-02 NOTE — Progress Notes (Signed)
Patient ID: [name], male   DOB: October 04, 1934, 80 y.o.   MRN: [id number]  [employer] 24 hr holter applied

## 2014-12-04 ENCOUNTER — Telehealth: Payer: Self-pay | Admitting: Cardiology

## 2014-12-04 NOTE — Telephone Encounter (Signed)
Walk-in pt form- [name] form dropped off gave to [employer]

## 2014-12-04 NOTE — Telephone Encounter (Signed)
Received from Medical Records and given to Dr. Radford Pax for signing.

## 2014-12-10 ENCOUNTER — Telehealth: Payer: Self-pay | Admitting: Cardiology

## 2014-12-10 NOTE — Telephone Encounter (Signed)
Please let patient know that heart monitor showed atrial fibrillation with average HR 98bpm and maximum HR 149 bpm.  There were wide complex beats most likely c/w aberration with Ashman's phenomenon vs. PVC.  Please have patient come in for nurse OV to check BP and HR to determined if we can increase BB more for better rate control.

## 2014-12-12 NOTE — Telephone Encounter (Signed)
Left message to call back  

## 2014-12-12 NOTE — Telephone Encounter (Signed)
Informed patient of results and verbal understanding expressed.  Patient st [name] increased his Toprol just a couple days before he wore the monitor.  He st he is asymptomatic and would like to hold off until his OV with Dr. Radford Pax 5/13.  He st he will come in if necessary but wanted to ask Dr. Radford Pax if he could wait, seeing as though he had just increased the Toprol.

## 2014-12-20 ENCOUNTER — Encounter: Payer: Self-pay | Admitting: Physician Assistant

## 2014-12-20 ENCOUNTER — Telehealth: Payer: Self-pay | Admitting: *Deleted

## 2014-12-20 ENCOUNTER — Ambulatory Visit (HOSPITAL_COMMUNITY): Payer: PPO | Attending: Cardiology

## 2014-12-20 ENCOUNTER — Other Ambulatory Visit: Payer: Self-pay

## 2014-12-20 DIAGNOSIS — I10 Essential (primary) hypertension: Secondary | ICD-10-CM | POA: Insufficient documentation

## 2014-12-20 DIAGNOSIS — I481 Persistent atrial fibrillation: Secondary | ICD-10-CM | POA: Insufficient documentation

## 2014-12-20 DIAGNOSIS — E669 Obesity, unspecified: Secondary | ICD-10-CM | POA: Diagnosis not present

## 2014-12-20 DIAGNOSIS — I420 Dilated cardiomyopathy: Secondary | ICD-10-CM | POA: Insufficient documentation

## 2014-12-20 DIAGNOSIS — Z6834 Body mass index (BMI) 34.0-34.9, adult: Secondary | ICD-10-CM | POA: Diagnosis not present

## 2014-12-20 DIAGNOSIS — I351 Nonrheumatic aortic (valve) insufficiency: Secondary | ICD-10-CM | POA: Diagnosis not present

## 2014-12-20 DIAGNOSIS — I509 Heart failure, unspecified: Secondary | ICD-10-CM | POA: Insufficient documentation

## 2014-12-20 DIAGNOSIS — I2510 Atherosclerotic heart disease of native coronary artery without angina pectoris: Secondary | ICD-10-CM | POA: Diagnosis not present

## 2014-12-20 DIAGNOSIS — E785 Hyperlipidemia, unspecified: Secondary | ICD-10-CM | POA: Diagnosis not present

## 2014-12-20 DIAGNOSIS — I4819 Other persistent atrial fibrillation: Secondary | ICD-10-CM

## 2014-12-20 NOTE — Telephone Encounter (Signed)
Pt notified of echo results with verbal understanding to results by phone. Advised to keep appt w/Dr. Radford Pax 5/112, pt said ok and thank you.

## 2014-12-26 ENCOUNTER — Ambulatory Visit (INDEPENDENT_AMBULATORY_CARE_PROVIDER_SITE_OTHER): Payer: PPO | Admitting: Cardiology

## 2014-12-26 ENCOUNTER — Encounter: Payer: Self-pay | Admitting: Cardiology

## 2014-12-26 VITALS — BP 120/72 | HR 64 | Ht 72.0 in | Wt 255.8 lb

## 2014-12-26 DIAGNOSIS — I420 Dilated cardiomyopathy: Secondary | ICD-10-CM

## 2014-12-26 DIAGNOSIS — I2510 Atherosclerotic heart disease of native coronary artery without angina pectoris: Secondary | ICD-10-CM | POA: Diagnosis not present

## 2014-12-26 DIAGNOSIS — R0602 Shortness of breath: Secondary | ICD-10-CM | POA: Diagnosis not present

## 2014-12-26 DIAGNOSIS — I10 Essential (primary) hypertension: Secondary | ICD-10-CM

## 2014-12-26 DIAGNOSIS — I4819 Other persistent atrial fibrillation: Secondary | ICD-10-CM

## 2014-12-26 DIAGNOSIS — I481 Persistent atrial fibrillation: Secondary | ICD-10-CM | POA: Diagnosis not present

## 2014-12-26 LAB — HEPATIC FUNCTION PANEL
ALT: 27 U/L (ref 0–53)
AST: 27 U/L (ref 0–37)
Albumin: 3.8 g/dL (ref 3.5–5.2)
Alkaline Phosphatase: 64 U/L (ref 39–117)
BILIRUBIN TOTAL: 0.7 mg/dL (ref 0.2–1.2)
Bilirubin, Direct: 0.1 mg/dL (ref 0.0–0.3)
Total Protein: 7.4 g/dL (ref 6.0–8.3)

## 2014-12-26 LAB — BASIC METABOLIC PANEL
BUN: 31 mg/dL — AB (ref 6–23)
CHLORIDE: 100 meq/L (ref 96–112)
CO2: 32 mEq/L (ref 19–32)
Calcium: 10.2 mg/dL (ref 8.4–10.5)
Creatinine, Ser: 1.26 mg/dL (ref 0.40–1.50)
GFR: 58.89 mL/min — ABNORMAL LOW (ref >60.00)
GLUCOSE: 78 mg/dL (ref 70–99)
POTASSIUM: 4.9 meq/L (ref 3.5–5.1)
SODIUM: 136 meq/L (ref 135–145)

## 2014-12-26 LAB — TSH: TSH: 1.21 u[IU]/mL (ref 0.35–4.50)

## 2014-12-26 LAB — BRAIN NATRIURETIC PEPTIDE: PRO B NATRI PEPTIDE: 211 pg/mL — AB (ref 0.0–100.0)

## 2014-12-26 MED ORDER — APIXABAN 5 MG PO TABS: 5.0000 mg | ORAL_TABLET | Freq: Two times a day (BID) | ORAL | Status: DC

## 2014-12-26 MED ORDER — AMIODARONE HCL 200 MG PO TABS: 200.0000 mg | ORAL_TABLET | Freq: Every day | ORAL | Status: DC

## 2014-12-26 NOTE — Progress Notes (Signed)
Cardiology Office Note   Date:  12/26/2014   ID:  [name], DOB January 18, 1935, MRN [id number]  PCP:  Mathews Argyle, MD    No chief complaint on file.     History of Present Illness: [name] is a 80 y.o. male who presents for followup of atrial fibrillation. He was seen by his PCP, Dr. Felipa Eth, on 07/23/2015 and was found to be in new onset atrial fibrillation. He was started on Eliquis and an echo was done which showed mild LV dysfunction EF 40% and dilated aortic root at 77mm with mild AI. He was changed from amlodipine to Toprol for better rate control. A Lexiscan myoview was done which showed a hgh risk stress nuclear study with large, severe intensity, partially reversible inferior defect and large, moderate intensity, partially reversible distal anterior/apical defect; findings are consistent with prior inferior infarct with mild peri-infarct ischemia and small prior apical infarct with moderate ischemia in the distal anterior wall and apex. He underwent cardiac cath showing 30% LM at the ostium, 70-80% ostial D2, 30-40% prox LCX, 30-40% prox and 40% mid RCA and EF 40%. He now presents today for followup. He is doing well. He underwent successful DCCV on 11/04/2014 to NSR.  He was seen back by the PA 4/10 and was back in atrial fibrillation.  He did not appreciate any improvement in how he felt when in NSR compared to afib but he says that he was not in NSR long enough to appreciate a change. Repeat echo showed no change in EF at 40-45%.  A sleep study has also been ordered which he has not had yet.  His weight has been stable.  He has been having problems with SOB that occurs mainly at night and will notice some congestion in his chest and has to clear his throat.  He takes Zantac at night.  He denies any LE edema.  He has not noticed much change during the day.  He says that if he takes extra lasix it will improve his SOB.    Past Medical History  Diagnosis Date    . Hyperlipidemia   . DJD (degenerative joint disease)     feet, knees and back  . GERD (gastroesophageal reflux disease)     hiatal hernia  . Obesity   . Renal cyst, left 2010    5 cm seen on ultrasound   . Lesion of left femoral nerve     3 cm MRI 04/2010 consistent with hemangioma  . PSA (psoriatic arthritis)     Mild increase 3.65 on 05/2011 stable 3.68 on 09/2011  . Back pain   . Mild aortic insufficiency   . Persistent atrial fibrillation 09/17/2014  . Dilated aortic root 09/17/2014  . DCM (dilated cardiomyopathy) 09/17/2014    a.  EF 40%; b. Echo 5/16:  EF 40-45%, diff HK, inf AK, mild AI, MAC, severe LAE  . CAD (coronary artery disease), native coronary artery 09/20/2014    30% LM at the ostium, 70-80% ostial D2, 30-40% prox LCX, 30-40% prox and 40% mid RCA and EF 40%.    . Benign essential HTN 09/17/2014    Past Surgical History  Procedure Laterality Date  . Left heart catheterization with coronary angiogram N/A 10/01/2014    Procedure: LEFT HEART CATHETERIZATION WITH CORONARY ANGIOGRAM;  Surgeon: Troy Sine, MD;  Location: [facility] CATH LAB;  Service: Cardiovascular;  Laterality: N/A;  . Cardioversion N/A 11/04/2014    Procedure: CARDIOVERSION;  Surgeon:  Sueanne Margarita, MD;  Location: [facility];  Service: Cardiovascular;  Laterality: N/A;     Current Outpatient Prescriptions  Medication Sig Dispense Refill  . acetaminophen (TYLENOL) 650 MG CR tablet Take 650 mg by mouth every 8 (eight) hours as needed for pain.    Marland Kitchen apixaban (ELIQUIS) 5 MG TABS tablet Take 5 mg by mouth 2 (two) times daily.    . Calcium Carb-Cholecalciferol 500-400 MG-UNIT TABS Take 1 tablet by mouth daily.    . furosemide (LASIX) 20 MG tablet Take 2 tablets (40 mg total) by mouth daily. 60 tablet 11  . loratadine (CLARITIN) 10 MG tablet Take 10 mg by mouth daily as needed for allergies.    Marland Kitchen losartan (COZAAR) 25 MG tablet Take 1 tablet (25 mg total) by mouth daily. 30 tablet 5  . meloxicam (MOBIC) 15 MG tablet  Take 15 mg by mouth daily.    . metoprolol succinate (TOPROL XL) 25 MG 24 hr tablet Take 1 tablet (25 mg total) by mouth 2 (two) times daily. 60 tablet 11  . MULTIPLE VITAMINS PO Take 1 tablet by mouth daily.    Marland Kitchen omega-3 acid ethyl esters (LOVAZA) 1 G capsule Take 1 g by mouth daily.    . polyethylene glycol (MIRALAX / GLYCOLAX) packet Take 17 g by mouth daily as needed (constipation).     . pravastatin (PRAVACHOL) 10 MG tablet Take 10 mg by mouth daily.     No current facility-administered medications for this visit.    Allergies:   Simvastatin; Codeine phosphate; and Penicillins    Social History:  The patient  reports that he has quit smoking. He does not have any smokeless tobacco history on file. He reports that he does not drink alcohol or use illicit drugs.   Family History:  The patient's family history includes Atrial fibrillation in his sister; Cancer in his mother; Diabetes in his father; Heart attack in his maternal grandmother, paternal grandfather, and paternal grandmother; Heart disease in his sister; Stroke in his maternal grandfather.    ROS:  Please see the history of present illness.   Otherwise, review of systems are positive for none.   All other systems are reviewed and negative.    PHYSICAL EXAM: VS:  BP 120/72 mmHg  Pulse 64  Ht 6' (1.829 m)  Wt 255 lb 12.8 oz (116.03 kg)  BMI 34.69 kg/m2  SpO2 97% , BMI Body mass index is 34.69 kg/(m^2). GEN: Well nourished, well developed, in no acute distress HEENT: normal Neck: no JVD, carotid bruits, or masses Cardiac: irregularly irregular; no murmurs, rubs, or gallops.  Trace  edema  Respiratory:  clear to auscultation bilaterally, normal work of breathing GI: soft, nontender, nondistended, + BS MS: no deformity or atrophy Skin: warm and dry, no rash Neuro:  Strength and sensation are intact Psych: euthymic mood, full affect   EKG:  EKG is not ordered today.    Recent Labs: 10/08/2014: Pro B Natriuretic  peptide (BNP) 513.0* 10/31/2014: BUN 41*; Creatinine 1.48; Hemoglobin 16.7; Platelets 199.0; Potassium 4.3; Sodium 136    Lipid Panel No results found for: CHOL, TRIG, HDL, CHOLHDL, VLDL, LDLCALC, LDLDIRECT    Wt Readings from Last 3 Encounters:  12/26/14 255 lb 12.8 oz (116.03 kg)  11/25/14 256 lb (116.121 kg)  10/31/14 256 lb 1.9 oz (116.175 kg)    ASSESSMENT AND PLAN:  1. Chronic Combined Systolic and Diastolic CHF: Volume remains stable but he has been taking extra lasix for SOB.  I will check a BNP today. Continue current therapy with Lasix/BB/ARB. 2. Persistent Atrial Fibrillation:rate controlled.  He is still having a lot of problems with his breathing although worse at night and seems to improve if he takes extra Lasix.  I am concerned that he is not tolerating the afib.  I think that we will need to try to get him back in NSR.  I will start him on Amio 200mg  BID and see him back in 3 weeks and if still in afib will set up for repeat DCCV.  I will get baseline PFTs with DLCO, TSH and LFTs.  He has a sleep study pending next month.  3. Non-Ischemic CM: Probably related to AFib (?tachycardia mediated). Continue ARB, beta blocker. 4. CAD: Non-obstructive by recent LHC. He is not on ASA as he is on Eliquis. Continue statin and beta blocker 5. HTN: Controlled on current regimen.  6. Hyperlipidemia: Continue Lovaza, statin. Check FLP and ALT 7. Dilated Ascending Aorta/Aortic Root: Repeat Echo due in 07/2015.    Current medicines are reviewed at length with the patient today. The patient does not have concerns regarding medicines. The following changes have been made: As above.            Current medicines are reviewed at length with the patient today.  The patient does not have concerns regarding medicines.  The following changes have been made:  Start Amiodarone 200mg  BID  Labs/ tests ordered today include: see above assessment and plan No orders of  the defined types were placed in this encounter.     Disposition:   FU with me in 3 weeks   Signed, Sueanne Margarita, MD  12/26/2014 9:34 AM    [facility] [place], [place], [place]  [address] Phone: [phone]; Fax: [phone]

## 2014-12-26 NOTE — Patient Instructions (Addendum)
Medication Instructions:  Your physician has recommended you make the following change in your medication:  1) START AMIODARONE 200 mg TWICE DAILY  Labwork: TODAY: BMET, TSH, LFTs, BNP  Your physician recommends that you return for lab work IN ONE WEEK: FLP  Testing/Procedures: Your physician has recommended that you have a pulmonary function test. Pulmonary Function Tests are a group of tests that measure how well air moves in and out of your lungs.  Follow-Up: You have an appointment scheduled 01/22/2015 at 2:20 PM with Richardson Dopp.  Any Other Special Instructions Will Be Listed Below (If Applicable).

## 2014-12-27 ENCOUNTER — Telehealth: Payer: Self-pay | Admitting: Cardiology

## 2014-12-27 ENCOUNTER — Other Ambulatory Visit: Payer: Self-pay

## 2014-12-27 ENCOUNTER — Telehealth: Payer: Self-pay

## 2014-12-27 ENCOUNTER — Ambulatory Visit: Payer: PPO | Admitting: Cardiology

## 2014-12-27 DIAGNOSIS — I5023 Acute on chronic systolic (congestive) heart failure: Secondary | ICD-10-CM

## 2014-12-27 MED ORDER — AMIODARONE HCL 200 MG PO TABS: 200.0000 mg | ORAL_TABLET | Freq: Two times a day (BID) | ORAL | Status: DC

## 2014-12-27 MED ORDER — FUROSEMIDE 20 MG PO TABS: 40.0000 mg | ORAL_TABLET | ORAL | Status: DC

## 2014-12-27 NOTE — Telephone Encounter (Signed)
Informed patient of results and verbal understanding expressed.  Instructed patient to INCREASE LASIX to 40 mg qAM and 20 mg qPM.  BMET scheduled in 1 week. Patient agrees with treatment plan.

## 2014-12-27 NOTE — Telephone Encounter (Signed)
Verified with patient he is to take amiodarone twice daily.  Rx re-sent to pharmacy. Patient grateful for callback.

## 2014-12-27 NOTE — Telephone Encounter (Signed)
New  Message      Pt was seen yesterday.  He thought Dr Radford Pax said take 1 amiodarone tablet two times a day.  The presc and his print out from the office says take 1 tablet daily.  He also go 60 pills.  Please call to clarify dosage

## 2014-12-27 NOTE — Telephone Encounter (Signed)
-----   Message from Sueanne Margarita, MD sent at 12/26/2014  8:41 PM EDT ----- Increase Lasix to 40mg  qam and 20mg  q pm and check BMET in 1 week

## 2014-12-30 ENCOUNTER — Other Ambulatory Visit: Payer: PPO

## 2015-01-03 ENCOUNTER — Telehealth: Payer: Self-pay | Admitting: Cardiology

## 2015-01-03 ENCOUNTER — Other Ambulatory Visit (INDEPENDENT_AMBULATORY_CARE_PROVIDER_SITE_OTHER): Payer: PPO | Admitting: *Deleted

## 2015-01-03 ENCOUNTER — Ambulatory Visit (INDEPENDENT_AMBULATORY_CARE_PROVIDER_SITE_OTHER): Payer: PPO | Admitting: *Deleted

## 2015-01-03 VITALS — BP 120/70 | HR 62 | Wt 259.8 lb

## 2015-01-03 DIAGNOSIS — I10 Essential (primary) hypertension: Secondary | ICD-10-CM

## 2015-01-03 DIAGNOSIS — I481 Persistent atrial fibrillation: Secondary | ICD-10-CM

## 2015-01-03 DIAGNOSIS — I2510 Atherosclerotic heart disease of native coronary artery without angina pectoris: Secondary | ICD-10-CM | POA: Diagnosis not present

## 2015-01-03 DIAGNOSIS — I4819 Other persistent atrial fibrillation: Secondary | ICD-10-CM

## 2015-01-03 DIAGNOSIS — I5023 Acute on chronic systolic (congestive) heart failure: Secondary | ICD-10-CM

## 2015-01-03 LAB — LIPID PANEL
CHOL/HDL RATIO: 5
Cholesterol: 185 mg/dL (ref 0–200)
HDL: 40.2 mg/dL (ref >39.00)
LDL CALC: 117 mg/dL — AB (ref 0–99)
NONHDL: 144.8
Triglycerides: 138 mg/dL (ref 0.0–149.0)
VLDL: 27.6 mg/dL (ref 0.0–40.0)

## 2015-01-03 LAB — BASIC METABOLIC PANEL
BUN: 27 mg/dL — AB (ref 6–23)
CALCIUM: 10 mg/dL (ref 8.4–10.5)
CO2: 31 mEq/L (ref 19–32)
CREATININE: 1.28 mg/dL (ref 0.40–1.50)
Chloride: 100 mEq/L (ref 96–112)
GFR: 57.83 mL/min — AB (ref >60.00)
GLUCOSE: 98 mg/dL (ref 70–99)
POTASSIUM: 4.6 meq/L (ref 3.5–5.1)
Sodium: 137 mEq/L (ref 135–145)

## 2015-01-03 NOTE — Progress Notes (Signed)
The patient was here for follow up lab work today and added on to the nurse room schedule as a "walk-in" with complaints of a 5 lb weight gain since his office visit on 12/26/14 with Dr. Radford Pax. He was started on amiodarone 200 mg BID on 5/12 and was told to increase his lasix to 40 mg in the AM and 20 mg in the PM. He was 250 lbs on his home scale before he came in last week and today he was 255 lbs at home. His weight is up from 255 on our scale to 259.12 lbs today. He states he is having trouble laying flat. He is having lower extremity swelling and is having pressure in his upper abdomen/ lower chest area. His most recent echo on 12/20/14 showed an EF of 40-45%. Per Dr. Theodosia Blender note, the patient may eventually require DCCV. I have reviewed the patient's symptoms with Dr. Angelena Form (DOD). He ordered the patient to increase his lasix to 40 mg TID x 2 days, then decrease to 40 mg BID. He is to monitor his weights and call back Monday with an update on his weight and symptoms. He is having follow up labs today per Dr. Radford Pax. 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 I have instructed him that if his symptoms do not improve, he should report to the ER for further evaluation and possible IV diuresis. He voices understanding. Will forward to Dr. Radford Pax for review.

## 2015-01-03 NOTE — Telephone Encounter (Signed)
error 

## 2015-01-03 NOTE — Progress Notes (Signed)
The patient has been scheduled to follow up with Dr. Radford Pax on 5/23 at 11:15 am.

## 2015-01-03 NOTE — Patient Instructions (Signed)
Medication Instructions: - increase lasix to 40 mg three times a day x 2 days, then - take lasix 40 mg twice daily  Labwork: - as scheduled today  Procedures/Testing: - none  Follow-Up: - call back Monday to let us know how your weight and symptoms are doing. - if no improvement over the weekend, please report to the ER for further evaluation  Any Additional Special Instructions Will Be Listed Below (If Applicable).

## 2015-01-06 ENCOUNTER — Ambulatory Visit (INDEPENDENT_AMBULATORY_CARE_PROVIDER_SITE_OTHER): Payer: PPO | Admitting: Cardiology

## 2015-01-06 ENCOUNTER — Other Ambulatory Visit: Payer: Self-pay | Admitting: Cardiology

## 2015-01-06 ENCOUNTER — Encounter: Payer: Self-pay | Admitting: Cardiology

## 2015-01-06 VITALS — BP 120/78 | HR 66 | Ht 72.0 in | Wt 259.6 lb

## 2015-01-06 DIAGNOSIS — I10 Essential (primary) hypertension: Secondary | ICD-10-CM | POA: Diagnosis not present

## 2015-01-06 DIAGNOSIS — I5023 Acute on chronic systolic (congestive) heart failure: Secondary | ICD-10-CM

## 2015-01-06 DIAGNOSIS — I420 Dilated cardiomyopathy: Secondary | ICD-10-CM

## 2015-01-06 DIAGNOSIS — I5042 Chronic combined systolic (congestive) and diastolic (congestive) heart failure: Secondary | ICD-10-CM | POA: Insufficient documentation

## 2015-01-06 DIAGNOSIS — I4819 Other persistent atrial fibrillation: Secondary | ICD-10-CM

## 2015-01-06 DIAGNOSIS — I481 Persistent atrial fibrillation: Secondary | ICD-10-CM

## 2015-01-06 MED ORDER — FUROSEMIDE 20 MG PO TABS: 40.0000 mg | ORAL_TABLET | Freq: Two times a day (BID) | ORAL | Status: DC

## 2015-01-06 NOTE — Progress Notes (Signed)
Cardiology Office Note   Date:  01/06/2015   ID:  [name], DOB Apr 06, 1935, MRN [id number]  PCP:  Mathews Argyle, MD  Cardiologist:   Sueanne Margarita, MD   Chief Complaint  Patient presents with  . Follow-up    persistent atrial fib      History of Present Illness: [name] is a 80 y.o. male who presents for followup of atrial fibrillation. He was seen by his PCP, Dr. Felipa Eth, on 07/22/2014 and was found to be in new onset atrial fibrillation. He was started on Eliquis and an echo was done which showed mild LV dysfunction EF 40% and dilated aortic root at 61mm with mild AI. He was changed from amlodipine to Toprol for better rate control. A Lexiscan myoview was done which showed a hgh risk stress nuclear study with large, severe intensity, partially reversible inferior defect and large, moderate intensity, partially reversible distal anterior/apical defect; findings are consistent with prior inferior infarct with mild peri-infarct ischemia and small prior apical infarct with moderate ischemia in the distal anterior wall and apex. He underwent cardiac cath showing 30% LM at the ostium, 70-80% ostial D2, 30-40% prox LCX, 30-40% prox and 40% mid RCA and EF 40%. He underwent successful DCCV on 11/04/2014 to NSR. He was seen back by the PA 4/10 and was back in atrial fibrillation. He did not appreciate any improvement in how he felt when in NSR compared to afib but he says that he was not in NSR long enough to appreciate a change. Repeat echo showed no change in EF at 40-45%.The last time I saw himhe had been having problems with SOB that occured mainly at night and would notice some congestion in his chest and would have to clear his throat. He takes Zantac at night. He denied any LE edema.BNP was mildy elevated so his Lasix was increased to 40mg  qam and 20mg  q pm.  He was started on Amio.  Late last week he was having problems with weight gain and fluid retention and  his lasix was increased to 40mg  BID for 3 days and now back to prior dosing.  He says he has not missed a dose of Eliquis in 5 weeks   Past Medical History  Diagnosis Date  . Hyperlipidemia   . DJD (degenerative joint disease)     feet, knees and back  . GERD (gastroesophageal reflux disease)     hiatal hernia  . Obesity   . Renal cyst, left 2010    5 cm seen on ultrasound   . Lesion of left femoral nerve     3 cm MRI 04/2010 consistent with hemangioma  . PSA (psoriatic arthritis)     Mild increase 3.65 on 05/2011 stable 3.68 on 09/2011  . Back pain   . Mild aortic insufficiency   . Persistent atrial fibrillation 09/17/2014  . Dilated aortic root 09/17/2014  . DCM (dilated cardiomyopathy) 09/17/2014    a.  EF 40%; b. Echo 5/16:  EF 40-45%, diff HK, inf AK, mild AI, MAC, severe LAE  . CAD (coronary artery disease), native coronary artery 09/20/2014    30% LM at the ostium, 70-80% ostial D2, 30-40% prox LCX, 30-40% prox and 40% mid RCA and EF 40%.    . Benign essential HTN 09/17/2014    Past Surgical History  Procedure Laterality Date  . Left heart catheterization with coronary angiogram N/A 10/01/2014    Procedure: LEFT HEART CATHETERIZATION WITH CORONARY ANGIOGRAM;  Surgeon:  Troy Sine, MD;  Location: [facility] CATH LAB;  Service: Cardiovascular;  Laterality: N/A;  . Cardioversion N/A 11/04/2014    Procedure: CARDIOVERSION;  Surgeon: Sueanne Margarita, MD;  Location: [facility] ENDOSCOPY;  Service: Cardiovascular;  Laterality: N/A;     Current Outpatient Prescriptions  Medication Sig Dispense Refill  . acetaminophen (TYLENOL) 650 MG CR tablet Take 650 mg by mouth every 8 (eight) hours as needed for pain.    Marland Kitchen amiodarone (PACERONE) 200 MG tablet Take 1 tablet (200 mg total) by mouth 2 (two) times daily. 60 tablet 6  . apixaban (ELIQUIS) 5 MG TABS tablet Take 1 tablet (5 mg total) by mouth 2 (two) times daily. 180 tablet 3  . Calcium Carb-Cholecalciferol 500-400 MG-UNIT TABS Take 1 tablet by mouth daily.     . furosemide (LASIX) 20 MG tablet Take 2 tablets (40 mg total) by mouth every morning. Take 1 tablet (20 mg total) by mouth every evening. (Patient taking differently: Take two tablets (40 mg) by mouth twice daily.) 90 tablet 11  . loratadine (CLARITIN) 10 MG tablet Take 10 mg by mouth daily as needed for allergies.    Marland Kitchen losartan (COZAAR) 25 MG tablet Take 1 tablet (25 mg total) by mouth daily. 30 tablet 5  . meloxicam (MOBIC) 15 MG tablet Take 15 mg by mouth daily.    . metoprolol succinate (TOPROL XL) 25 MG 24 hr tablet Take 1 tablet (25 mg total) by mouth 2 (two) times daily. 60 tablet 11  . MULTIPLE VITAMINS PO Take 1 tablet by mouth daily.    Marland Kitchen omega-3 acid ethyl esters (LOVAZA) 1 G capsule Take 1 g by mouth daily.    . polyethylene glycol (MIRALAX / GLYCOLAX) packet Take 17 g by mouth daily as needed (constipation).     . pravastatin (PRAVACHOL) 10 MG tablet Take 10 mg by mouth daily.     No current facility-administered medications for this visit.    Allergies:   Simvastatin; Codeine phosphate; and Penicillins    Social History:  The patient  reports that he has quit smoking. He does not have any smokeless tobacco history on file. He reports that he does not drink alcohol or use illicit drugs.   Family History:  The patient's family history includes Atrial fibrillation in his sister; Cancer in his mother; Diabetes in his father; Heart attack in his maternal grandmother, paternal grandfather, and paternal grandmother; Heart disease in his sister; Stroke in his maternal grandfather.    ROS:  Please see the history of present illness.   Otherwise, review of systems are positive for none.   All other systems are reviewed and negative.    PHYSICAL EXAM: VS:  BP 120/78 mmHg  Pulse 66  Ht 6' (1.829 m)  Wt 259 lb 9.6 oz (117.754 kg)  BMI 35.20 kg/m2  SpO2 95% , BMI Body mass index is 35.2 kg/(m^2). GEN: Well nourished, well developed, in no acute distress HEENT: normal Neck: no  JVD, carotid bruits, or masses Cardiac: RRR; no murmurs, rubs, or gallops.  1+ edema bilaterally Respiratory:  clear to auscultation bilaterally, normal work of breathing GI: soft, nontender, nondistended, + BS MS: no deformity or atrophy Skin: warm and dry, no rash Neuro:  Strength and sensation are intact Psych: euthymic mood, full affect   EKG:  EKG is not ordered today.    Recent Labs: 10/31/2014: Hemoglobin 16.7; Platelets 199.0 12/26/2014: ALT 27; Pro B Natriuretic peptide (BNP) 211.0*; TSH 1.21 01/03/2015: BUN 27*; Creatinine  1.28; Potassium 4.6; Sodium 137    Lipid Panel    Component Value Date/Time   CHOL 185 01/03/2015 1023   TRIG 138.0 01/03/2015 1023   HDL 40.20 01/03/2015 1023   CHOLHDL 5 01/03/2015 1023   VLDL 27.6 01/03/2015 1023   LDLCALC 117* 01/03/2015 1023      Wt Readings from Last 3 Encounters:  01/06/15 259 lb 9.6 oz (117.754 kg)  01/03/15 259 lb 12 oz (117.822 kg)  12/26/14 255 lb 12.8 oz (116.03 kg)     ASSESSMENT AND PLAN:  1. Chronic Combined Systolic and Diastolic CHF: Volume remains slightly increased with LE edema.  I suspect his afib is driving this..  Continue current therapy with Lasix/BB/ARB. 2. Persistent Atrial Fibrillation:rate controlled.  I think that we will need to try to get him back in NSR given his problems with persistent volume overload despite increasing lasix.Cnotinue  Amio 200mg  BID and repeat DCCV this week.  He has not missed any doses of Eliquis in over 5 weeks. 3. Non-Ischemic CM: Probably related to AFib (?tachycardia mediated). Continue ARB, beta blocker.  EF 40% 4. CAD: Non-obstructive by recent LHC. He is not on ASA as he is on Eliquis. Continue statin and beta blocker 5. HTN: Controlled on current regimen.  6. Hyperlipidemia: Continue Lovaza, statin. Check FLP and ALT 7. Dilated Ascending Aorta/Aortic Root: Repeat Echo due in 07/2015.    Current medicines are reviewed at length with the  patient today.  The patient does not have concerns regarding medicines.  The following changes have been made:  no change  Labs/ tests ordered today include: DCCV to be set up for this week  No orders of the defined types were placed in this encounter.     Disposition:   FU with me after DCCV Signed, Sueanne Margarita, MD  01/06/2015 11:44 AM    [facility] [address], [place], [place]  [address] Phone: [phone]; Fax: [phone]

## 2015-01-06 NOTE — Patient Instructions (Signed)
Medication Instructions:  Your physician has recommended you make the following change in your medication:  1) INCREASE LASIX TO 40 mg TWICE DAILY  Labwork: None  Testing/Procedures: Your physician has recommended that you have a Cardioversion (DCCV). Electrical Cardioversion uses a jolt of electricity to your heart either through paddles or wired patches attached to your chest. This is a controlled, usually prescheduled, procedure. Defibrillation is done under light anesthesia in the hospital, and you usually go home the day of the procedure. This is done to get your heart back into a normal rhythm. You are not awake for the procedure. Please see the instruction sheet given to you today.

## 2015-01-09 ENCOUNTER — Ambulatory Visit (HOSPITAL_COMMUNITY)
Admission: RE | Admit: 2015-01-09 | Discharge: 2015-01-09 | Disposition: A | Discharge status: Home / Self Care | Payer: PPO | Source: Ambulatory Visit | Attending: Internal Medicine | Admitting: Internal Medicine

## 2015-01-09 ENCOUNTER — Encounter (HOSPITAL_COMMUNITY)
Admission: RE | Disposition: A | Discharge status: Home / Self Care | Payer: Self-pay | Source: Ambulatory Visit | Attending: Internal Medicine

## 2015-01-09 ENCOUNTER — Ambulatory Visit (HOSPITAL_COMMUNITY): Payer: PPO | Admitting: Anesthesiology

## 2015-01-09 ENCOUNTER — Encounter (HOSPITAL_COMMUNITY): Payer: Self-pay | Admitting: *Deleted

## 2015-01-09 DIAGNOSIS — Z87891 Personal history of nicotine dependence: Secondary | ICD-10-CM | POA: Insufficient documentation

## 2015-01-09 DIAGNOSIS — I4891 Unspecified atrial fibrillation: Secondary | ICD-10-CM | POA: Insufficient documentation

## 2015-01-09 DIAGNOSIS — I481 Persistent atrial fibrillation: Secondary | ICD-10-CM

## 2015-01-09 DIAGNOSIS — I739 Peripheral vascular disease, unspecified: Secondary | ICD-10-CM | POA: Diagnosis not present

## 2015-01-09 DIAGNOSIS — I2510 Atherosclerotic heart disease of native coronary artery without angina pectoris: Secondary | ICD-10-CM | POA: Insufficient documentation

## 2015-01-09 DIAGNOSIS — I10 Essential (primary) hypertension: Secondary | ICD-10-CM | POA: Diagnosis not present

## 2015-01-09 DIAGNOSIS — I509 Heart failure, unspecified: Secondary | ICD-10-CM | POA: Diagnosis not present

## 2015-01-09 DIAGNOSIS — I4819 Other persistent atrial fibrillation: Secondary | ICD-10-CM | POA: Diagnosis present

## 2015-01-09 DIAGNOSIS — I5042 Chronic combined systolic (congestive) and diastolic (congestive) heart failure: Secondary | ICD-10-CM | POA: Diagnosis present

## 2015-01-09 HISTORY — PX: CARDIOVERSION: SHX1299

## 2015-01-09 SURGERY — CARDIOVERSION
Anesthesia: Monitor Anesthesia Care

## 2015-01-09 MED ORDER — PROPOFOL 10 MG/ML IV BOLUS
INTRAVENOUS | Status: DC | PRN
  Administered 2015-01-09: 30 mg via INTRAVENOUS
  Administered 2015-01-09: 60 mg via INTRAVENOUS

## 2015-01-09 MED ORDER — SODIUM CHLORIDE 0.9 % IV SOLN
INTRAVENOUS | Status: DC
  Administered 2015-01-09 (×2): via INTRAVENOUS

## 2015-01-09 MED ORDER — LIDOCAINE HCL (CARDIAC) 20 MG/ML IV SOLN
INTRAVENOUS | Status: DC | PRN
  Administered 2015-01-09: 60 mg via INTRAVENOUS

## 2015-01-09 NOTE — Transfer of Care (Signed)
Immediate Anesthesia Transfer of Care Note  Patient: [name]  Procedure(s) Performed: Procedure(s): CARDIOVERSION (N/A)  Patient Location: PACU and Endoscopy Unit  Anesthesia Type:MAC  Level of Consciousness: awake, alert , oriented and patient cooperative  Airway & Oxygen Therapy: Patient Spontanous Breathing and Patient connected to nasal cannula oxygen  Post-op Assessment: Report given to RN and Post -op Vital signs reviewed and stable  Post vital signs: Reviewed and stable  Last Vitals:  Filed Vitals:   01/09/15 1306  BP: 147/96  Pulse: 70  Temp:   Resp: 18    Complications: No apparent anesthesia complications

## 2015-01-09 NOTE — Discharge Instructions (Signed)
Electrical Cardioversion °Electrical cardioversion is the delivery of a jolt of electricity to change the rhythm of the heart. Sticky patches or metal paddles are placed on the chest to deliver the electricity from a device. This is done to restore a normal rhythm. A rhythm that is too fast or not regular keeps the heart from pumping well. °Electrical cardioversion is done in an emergency if:  °· There is low or no blood pressure as a result of the heart rhythm.   °· Normal rhythm must be restored as fast as possible to protect the brain and heart from further damage.   °· It may save a life. °Cardioversion may be done for heart rhythms that are not immediately life threatening, such as atrial fibrillation or flutter, in which:  °· The heart is beating too fast or is not regular.   °· Medicine to change the rhythm has not worked.   °· It is safe to wait in order to allow time for preparation. °· Symptoms of the abnormal rhythm are bothersome. °· The risk of stroke and other serious problems can be reduced. °LET YOUR HEALTH CARE PROVIDER KNOW ABOUT:  °· Any allergies you have. °· All medicines you are taking, including vitamins, herbs, eye drops, creams, and over-the-counter medicines. °· Previous problems you or members of your family have had with the use of anesthetics.   °· Any blood disorders you have.   °· Previous surgeries you have had.   °· Medical conditions you have. °RISKS AND COMPLICATIONS  °Generally, this is a safe procedure. However, problems can occur and include:  °· Breathing problems related to the anesthetic used. °· A blood clot that breaks free and travels to other parts of your body. This could cause a stroke or other problems. The risk of this is lowered by use of blood-thinning medicine (anticoagulant) prior to the procedure. °· Cardiac arrest (rare). °BEFORE THE PROCEDURE  °· You may have tests to detect blood clots in your heart and to evaluate heart function.  °· You may start taking  anticoagulants so your blood does not clot as easily.   °· Medicines may be given to help stabilize your heart rate and rhythm. °PROCEDURE °· You will be given medicine through an IV tube to reduce discomfort and make you sleepy (sedative).   °· An electrical shock will be delivered. °AFTER THE PROCEDURE °Your heart rhythm will be watched to make sure it does not change.  °Document Released: 07/23/2002 Document Revised: 12/17/2013 Document Reviewed: 02/14/2013 °[employer]® Patient Information ©2015 [employer]. This information is not intended to replace advice given to you by your health care provider. Make sure you discuss any questions you have with your health care provider. ° °

## 2015-01-09 NOTE — H&P (Signed)
     INTERVAL PROCEDURE H&P  History and Physical Interval Note:  01/09/2015 11:44 AM  [name] has presented today for their planned procedure. The various methods of treatment have been discussed with the patient and family. After consideration of risks, benefits and other options for treatment, the patient has consented to the procedure.  The patients' outpatient history has been reviewed, patient examined, and no change in status from most recent office note within the past 30 days. I have reviewed the patients' chart and labs and will proceed as planned. Questions were answered to the patient's satisfaction.   Pixie Casino, MD, [facility] Attending Cardiologist [place]u C Stuti Sandin 01/09/2015, 11:44 AM

## 2015-01-09 NOTE — CV Procedure (Signed)
    CARDIOVERSION NOTE  Procedure: Electrical Cardioversion Indications:  Atrial Fibrillation  Procedure Details:  Consent: Risks of procedure as well as the alternatives and risks of each were explained to the (patient/caregiver).  Consent for procedure obtained.  Time Out: Verified patient identification, verified procedure, site/side was marked, verified correct patient position, special equipment/implants available, medications/allergies/relevent history reviewed, required imaging and test results available.  Performed  Patient placed on cardiac monitor, pulse oximetry, supplemental oxygen as necessary.  Sedation given: Propofol per anesthesia Pacer pads placed anterior and posterior chest.  Cardioverted 3 time(s).  Cardioverted at 150J, 200J, 200J biphasic.  Impression: Findings: Post procedure EKG shows: NSR Complications: None Patient did tolerate procedure well.  Plan: 1. Successful DCCV to NSR.   2. Continue amiodarone and Eliquis.  3. Follow-up with Dr. Radford Pax.  Time Spent Directly with the Patient:  30 minutes   Pixie Casino, MD, [facility] Attending Cardiologist [place] 01/09/2015, 1:16 PM

## 2015-01-09 NOTE — Anesthesia Preprocedure Evaluation (Signed)
Anesthesia Evaluation  Patient identified by MRN, date of birth, ID band Patient awake    Reviewed: Allergy & Precautions, NPO status , Patient's Chart, lab work & pertinent test results  Airway Mallampati: II  TM Distance: >3 FB Neck ROM: Full    Dental no notable dental hx.    Pulmonary neg pulmonary ROS, former smoker,  breath sounds clear to auscultation  Pulmonary exam normal       Cardiovascular hypertension, + CAD, + Peripheral Vascular Disease and +CHF Normal cardiovascular examRhythm:Irregular Rate:Normal  CATH 09/2014 30-40% blockages, done after high risk myoview. AF, EF 40%   Neuro/Psych negative neurological ROS  negative psych ROS   GI/Hepatic negative GI ROS, Neg liver ROS,   Endo/Other  negative endocrine ROS  Renal/GU negative Renal ROS  negative genitourinary   Musculoskeletal negative musculoskeletal ROS (+)   Abdominal   Peds negative pediatric ROS (+)  Hematology negative hematology ROS (+)   Anesthesia Other Findings   Reproductive/Obstetrics negative OB ROS                             Anesthesia Physical Anesthesia Plan  ASA: III  Anesthesia Plan: MAC   Post-op Pain Management:    Induction: Intravenous  Airway Management Planned: Simple Face Mask  Additional Equipment:   Intra-op Plan:   Post-operative Plan:   Informed Consent: I have reviewed the patients History and Physical, chart, labs and discussed the procedure including the risks, benefits and alternatives for the proposed anesthesia with the patient or authorized representative who has indicated his/her understanding and acceptance.   Dental advisory given  Plan Discussed with: CRNA and Surgeon  Anesthesia Plan Comments:         Anesthesia Quick Evaluation

## 2015-01-10 ENCOUNTER — Encounter (HOSPITAL_COMMUNITY): Payer: Self-pay | Admitting: Internal Medicine

## 2015-01-10 NOTE — Anesthesia Postprocedure Evaluation (Signed)
  Anesthesia Post-op Note  Patient: [name]  Procedure(s) Performed: Procedure(s) (LRB): CARDIOVERSION (N/A)  Patient Location: PACU  Anesthesia Type: MAC  Level of Consciousness: awake and alert   Airway and Oxygen Therapy: Patient Spontanous Breathing  Post-op Pain: mild  Post-op Assessment: Post-op Vital signs reviewed, Patient's Cardiovascular Status Stable, Respiratory Function Stable, Patent Airway and No signs of Nausea or vomiting  Last Vitals:  Filed Vitals:   01/09/15 1350  BP: 140/78  Pulse: 60  Temp:   Resp: 16    Post-op Vital Signs: stable   Complications: No apparent anesthesia complications

## 2015-01-22 ENCOUNTER — Ambulatory Visit (INDEPENDENT_AMBULATORY_CARE_PROVIDER_SITE_OTHER): Payer: PPO | Admitting: Physician Assistant

## 2015-01-22 ENCOUNTER — Encounter: Payer: Self-pay | Admitting: Physician Assistant

## 2015-01-22 VITALS — BP 120/70 | HR 66 | Ht 72.0 in | Wt 257.0 lb

## 2015-01-22 DIAGNOSIS — I10 Essential (primary) hypertension: Secondary | ICD-10-CM | POA: Diagnosis not present

## 2015-01-22 DIAGNOSIS — I4819 Other persistent atrial fibrillation: Secondary | ICD-10-CM

## 2015-01-22 DIAGNOSIS — I5023 Acute on chronic systolic (congestive) heart failure: Secondary | ICD-10-CM

## 2015-01-22 DIAGNOSIS — I2510 Atherosclerotic heart disease of native coronary artery without angina pectoris: Secondary | ICD-10-CM | POA: Diagnosis not present

## 2015-01-22 DIAGNOSIS — I5042 Chronic combined systolic (congestive) and diastolic (congestive) heart failure: Secondary | ICD-10-CM

## 2015-01-22 DIAGNOSIS — I481 Persistent atrial fibrillation: Secondary | ICD-10-CM

## 2015-01-22 DIAGNOSIS — I420 Dilated cardiomyopathy: Secondary | ICD-10-CM

## 2015-01-22 DIAGNOSIS — E785 Hyperlipidemia, unspecified: Secondary | ICD-10-CM

## 2015-01-22 DIAGNOSIS — I77810 Thoracic aortic ectasia: Secondary | ICD-10-CM

## 2015-01-22 DIAGNOSIS — R0681 Apnea, not elsewhere classified: Secondary | ICD-10-CM

## 2015-01-22 LAB — BASIC METABOLIC PANEL
BUN: 44 mg/dL — ABNORMAL HIGH (ref 6–23)
CO2: 30 mEq/L (ref 19–32)
Calcium: 9.9 mg/dL (ref 8.4–10.5)
Chloride: 102 mEq/L (ref 96–112)
Creatinine, Ser: 1.2 mg/dL (ref 0.40–1.50)
GFR: 62.29 mL/min (ref >60.00)
Glucose, Bld: 86 mg/dL (ref 70–99)
Potassium: 4.4 mEq/L (ref 3.5–5.1)
Sodium: 136 mEq/L (ref 135–145)

## 2015-01-22 MED ORDER — AMIODARONE HCL 200 MG PO TABS: 200.0000 mg | ORAL_TABLET | Freq: Every day | ORAL | Status: DC

## 2015-01-22 MED ORDER — FUROSEMIDE 40 MG PO TABS: 40.0000 mg | ORAL_TABLET | Freq: Two times a day (BID) | ORAL | Status: DC

## 2015-01-22 NOTE — Patient Instructions (Signed)
Medication Instructions:  Your physician has recommended you make the following change in your medication:  REDUCE Amiodarone to 200 mg daily. Take all other medications as prescribed  Labwork: Lab Today ([facility]met)  Your physician recommends that you return for lab work in: 2 months (Tsh,Hepatic)   Testing/Procedures: None   Follow-Up: Your physician recommends that you schedule a follow-up appointment in: 2 months with Dr.[name]   Any Other Special Instructions Will Be Listed Below (If Applicable).

## 2015-01-22 NOTE — Progress Notes (Signed)
Cardiology Office Note   Date:  01/22/2015   ID:  [name], DOB [birth year], MRN [id number]  PCP:  Mathews Argyle, MD  Cardiologist:  Dr. Fransico Him     Chief Complaint  Patient presents with  . Atrial Fibrillation  . Congestive Heart Failure     History of Present Illness: [name] is a 80 y.o. male with a hx of persistent AFib, NICM with EF 40%, dilated aortic root, HTN, HL.  He was being considered for DCCV.  However, recent stress test was abnormal prompting LHC on 2/16.  This demonstrated mild to mod non-obs CAD and medical Rx was recommended.  He was seen in FU in the office and was volume overloaded.  He responded to increased diuresis.  He was ultimately set up for DCCV done on 11/04/14 restoring NSR.  However, he had recurrent atrial fibrillation that was largely asymptomatic. Follow-up echocardiogram demonstrated continued LV dysfunction with an EF of 40-45%. When seen back in follow-up, he did note worsening problems with dyspnea at night and it was suspected that he was not tolerating atrial fibrillation. With continued LV dysfunction, rhythm control was felt to be warranted. He was placed on amiodarone. He underwent repeat DCCV 01/09/15 with restoration of NSR. Of note, sleep study is pending. He returns for follow-up.   He is feeling much better. Breathing is improved. He has been riding his bike (5-10 miles a day). He is NYHA 2-2b. He denies orthopnea, PND. He denies cough or wheezing at night. He denies LE edema. He denies chest pain. He denies syncope.   Studies/Reports Reviewed Today:  Echo 12/24/14 - EF 40% to 45%. Diffuse hypokinesis.  There is akinesis of the basal-midinferior myocardium. - Aortic valve: There was mild regurgitation. - Mitral valve: Calcified annulus. - Left atrium: The atrium was severely dilated. Impressions:- Compared to the prior study, there has been no significant  interval change.  Cardiac Cath/PCI 10/01/14 LM:  Ostial  30% LAD:  Lum Irregs; Ostial D2 70-80% LCx:  Proximal 30-40% RCA:  Mid 40%, distal 20% EF:  EF 40% Med Rx  Myoview 09/23/14 High risk Large, severe intensity, partially reversible inferior defect and large, moderate intensity, partially reversible distal anterior/apical defect; findings are consistent with prior inferior infarct with mild peri-infarct ischemia and small prior apical infarct with moderate ischemia in the distal anterior wall and apex.   Echocardiogram 07/2014 - Mild LVH. EF 40%. Diffuse HK.  - Mild AI - Aorta: Dilated aortic root and ascending aorta. (Root 46 mm, Ascending aorta 42 mm) - Mod LAE - Mild RVE.  Normal RVF. - Mild RAE - PA peak pressure: 34 mm Hg (S).   Past Medical History  Diagnosis Date  . Hyperlipidemia   . DJD (degenerative joint disease)     feet, knees and back  . GERD (gastroesophageal reflux disease)     hiatal hernia  . Obesity   . Renal cyst, left 2010    5 cm seen on ultrasound   . Lesion of left femoral nerve     3 cm MRI 04/2010 consistent with hemangioma  . PSA (psoriatic arthritis)     Mild increase 3.65 on 05/2011 stable 3.68 on 09/2011  . Back pain   . Mild aortic insufficiency   . Persistent atrial fibrillation 09/17/2014  . Dilated aortic root 09/17/2014  . DCM (dilated cardiomyopathy) 09/17/2014    a.  EF 40%; b. Echo 5/16:  EF 40-45%, diff HK, inf AK, mild AI,  MAC, severe LAE  . CAD (coronary artery disease), native coronary artery 09/20/2014    30% LM at the ostium, 70-80% ostial D2, 30-40% prox LCX, 30-40% prox and 40% mid RCA and EF 40%.    . Benign essential HTN 09/17/2014    Past Surgical History  Procedure Laterality Date  . Left heart catheterization with coronary angiogram N/A 10/01/2014    Procedure: LEFT HEART CATHETERIZATION WITH CORONARY ANGIOGRAM;  Surgeon: Troy Sine, MD;  Location: [facility] CATH LAB;  Service: Cardiovascular;  Laterality: N/A;  . Cardioversion N/A 11/04/2014    Procedure: CARDIOVERSION;  Surgeon: Sueanne Margarita, MD;  Location: [facility] ENDOSCOPY;  Service: Cardiovascular;  Laterality: N/A;  . Cardioversion N/A 01/09/2015    Procedure: CARDIOVERSION;  Surgeon: Pixie Casino, MD;  Location: [facility] ENDOSCOPY;  Service: Cardiovascular;  Laterality: N/A;     Current Outpatient Prescriptions  Medication Sig Dispense Refill  . acetaminophen (TYLENOL) 650 MG CR tablet Take 650 mg by mouth every 8 (eight) hours as needed for pain.    Marland Kitchen amiodarone (PACERONE) 200 MG tablet Take 1 tablet (200 mg total) by mouth daily.    Marland Kitchen apixaban (ELIQUIS) 5 MG TABS tablet Take 1 tablet (5 mg total) by mouth 2 (two) times daily. 180 tablet 3  . Calcium Carb-Cholecalciferol 500-400 MG-UNIT TABS Take 1 tablet by mouth daily.    . furosemide (LASIX) 40 MG tablet Take 1 tablet (40 mg total) by mouth 2 (two) times daily. 60 tablet 11  . loratadine (CLARITIN) 10 MG tablet Take 10 mg by mouth daily as needed for allergies.    Marland Kitchen losartan (COZAAR) 25 MG tablet Take 1 tablet (25 mg total) by mouth daily. 30 tablet 5  . meloxicam (MOBIC) 15 MG tablet Take 15 mg by mouth daily.    . metoprolol succinate (TOPROL XL) 25 MG 24 hr tablet Take 1 tablet (25 mg total) by mouth 2 (two) times daily. 60 tablet 11  . MULTIPLE VITAMINS PO Take 1 tablet by mouth daily.    Marland Kitchen omega-3 acid ethyl esters (LOVAZA) 1 G capsule Take 1 g by mouth daily.    . polyethylene glycol (MIRALAX / GLYCOLAX) packet Take 17 g by mouth daily as needed (constipation).     . pravastatin (PRAVACHOL) 10 MG tablet Take 10 mg by mouth daily.     No current facility-administered medications for this visit.    Allergies:   Simvastatin; Codeine phosphate; and Penicillins    Social History:  The patient  reports that he has quit smoking. He does not have any smokeless tobacco history on file. He reports that he does not drink alcohol or use illicit drugs.   Family History:  The patient's family history includes Atrial fibrillation in his sister; Cancer in his mother;  Diabetes in his father; Heart attack in his maternal grandmother, paternal grandfather, and paternal grandmother; Heart disease in his sister; Stroke in his maternal grandfather.    ROS:   Please see the history of present illness.   Review of Systems  Gastrointestinal: Negative for hematochezia and melena.  Genitourinary: Negative for hematuria.  All other systems reviewed and are negative.    PHYSICAL EXAM: VS:  BP 120/70 mmHg  Pulse 66  Ht 6' (1.829 m)  Wt 257 lb (116.574 kg)  BMI 34.85 kg/m2    Wt Readings from Last 3 Encounters:  01/22/15 257 lb (116.574 kg)  01/06/15 259 lb 9.6 oz (117.754 kg)  01/03/15 259 lb 12 oz (117.822  kg)     GEN: Well nourished, well developed, in no acute distress HEENT: normal Neck: no JVD, no masses Cardiac:  Normal S1/S2, RRR; no murmur, no rubs or gallops, no bilateral LE  edema  Respiratory:  Clear to auscultation bilaterally, no rales, no wheezing or rhonchi. GI: soft, nontender, nondistended, + BS MS: no deformity or atrophy Skin: warm and dry  Neuro:  CNs II-XII intact, Strength and sensation are intact Psych: Normal affect Ext:  right wrist without hematoma or mass   EKG:  EKG is ordered today.  It demonstrates:   NSR, HR 65, normal axis, QTC 478  Recent Labs: 10/31/2014: Hemoglobin 16.7; Platelets 199.0 12/26/2014: ALT 27; Pro B Natriuretic peptide (BNP) 211.0*; TSH 1.21 01/22/2015: BUN 44*; Creatinine, Ser 1.20; Potassium 4.4; Sodium 136    Lipid Panel    Component Value Date/Time   CHOL 185 01/03/2015 1023   TRIG 138.0 01/03/2015 1023   HDL 40.20 01/03/2015 1023   CHOLHDL 5 01/03/2015 1023   VLDL 27.6 01/03/2015 1023   LDLCALC 117* 01/03/2015 1023      ASSESSMENT AND PLAN:  1.  Chronic Combined Systolic and Diastolic CHF:  Volume improved. Continue current dose of Lasix. He is NYHA 2-2b. Check BMET today. If BUN, creatinine increasing, decreasing Lasix dose.  2.  Persistent Atrial Fibrillation:  Maintaining NSR on  amiodarone. He has been adequately loaded. I will decrease his amiodarone to 200 mg daily. Continue Eliquis. He is having difficulty affording this. We will try to obtain samples and give him a prescription card. PFTs are pending. Repeat LFTs and TSH at next office visit.  3.  Non-Ischemic CM:  Probably related to AFib (?tachycardia mediated).  Continue ARB, beta blocker.   Plan repeat echocardiogram in 07/2015.  4.  CAD:  Non-obstructive by recent LHC.  He is not on ASA as he is on Eliquis.  Continue statin and beta blocker. No angina.  5.  HTN:  Controlled on current regimen.    6.  Hyperlipidemia:  Continue Lovaza, statin.    7.  Dilated Ascending Aorta/Aortic Root:  Repeat Echo due in 07/2015.    8.  Snoring/Apnea:  Sleep study is pending.     Current medicines are reviewed at length with the patient today.  Any concerns are outlined above.  The following changes have been made:    Decrease amiodarone to 200 mg daily   Labs/ tests ordered today include:  Orders Placed This Encounter  Procedures  . Basic metabolic panel  . TSH  . Hepatic function panel  . EKG 12-Lead    Disposition:   FU Dr. Radford Pax 2 months   Signed, Richardson Dopp, PA-C, MHS 01/22/2015 5:18 PM    [facility], [place], [place]  [address] Phone: [phone]; Fax: [phone]

## 2015-01-23 ENCOUNTER — Telehealth: Payer: Self-pay | Admitting: Physician Assistant

## 2015-01-23 DIAGNOSIS — I10 Essential (primary) hypertension: Secondary | ICD-10-CM

## 2015-01-23 DIAGNOSIS — I5042 Chronic combined systolic (congestive) and diastolic (congestive) heart failure: Secondary | ICD-10-CM

## 2015-01-23 NOTE — Telephone Encounter (Signed)
New Message      Pt wants to speak to the nurse about recent Dr. Visit and the results.

## 2015-01-24 NOTE — Telephone Encounter (Signed)
F/u ° ° °Pt calling about previous message °

## 2015-01-24 NOTE — Telephone Encounter (Signed)
S/w pt about his lab results though I could not note on results since they were not available to me. Pt aware to decrease lasix to 60 mg daily; he asked if he could just get repeat bmet 6/14 when he has PFT's at [facility]. I said that will be fine. Pt said ok

## 2015-01-28 ENCOUNTER — Ambulatory Visit (INDEPENDENT_AMBULATORY_CARE_PROVIDER_SITE_OTHER): Payer: PPO | Admitting: Internal Medicine

## 2015-01-28 DIAGNOSIS — I481 Persistent atrial fibrillation: Secondary | ICD-10-CM | POA: Diagnosis not present

## 2015-01-28 DIAGNOSIS — I4819 Other persistent atrial fibrillation: Secondary | ICD-10-CM

## 2015-01-28 DIAGNOSIS — R0602 Shortness of breath: Secondary | ICD-10-CM

## 2015-01-28 LAB — PULMONARY FUNCTION TEST
DL/VA % PRED: 108 %
DL/VA: 5 ml/min/mmHg/L
DLCO UNC % PRED: 97 %
DLCO unc: 32.29 ml/min/mmHg
FEF 25-75 Post: 3.44 L/sec
FEF 25-75 Pre: 3.27 L/sec
FEF2575-%CHANGE-POST: 5 %
FEF2575-%PRED-POST: 160 %
FEF2575-%Pred-Pre: 152 %
FEV1-%Change-Post: 0 %
FEV1-%Pred-Post: 104 %
FEV1-%Pred-Pre: 104 %
FEV1-PRE: 3.16 L
FEV1-Post: 3.15 L
FEV1FVC-%Change-Post: 1 %
FEV1FVC-%Pred-Pre: 114 %
FEV6-%Change-Post: 0 %
FEV6-%PRED-POST: 95 %
FEV6-%Pred-Pre: 96 %
FEV6-PRE: 3.79 L
FEV6-Post: 3.77 L
FEV6FVC-%Change-Post: 0 %
FEV6FVC-%Pred-Post: 106 %
FEV6FVC-%Pred-Pre: 106 %
FVC-%CHANGE-POST: -1 %
FVC-%Pred-Post: 89 %
FVC-%Pred-Pre: 91 %
FVC-POST: 3.78 L
FVC-Pre: 3.84 L
POST FEV6/FVC RATIO: 100 %
Post FEV1/FVC ratio: 83 %
Pre FEV1/FVC ratio: 82 %
Pre FEV6/FVC Ratio: 100 %
RV % pred: 95 %
RV: 2.52 L
TLC % pred: 89 %
TLC: 6.43 L

## 2015-01-28 NOTE — Progress Notes (Signed)
PFT done today. 

## 2015-02-03 ENCOUNTER — Ambulatory Visit (HOSPITAL_BASED_OUTPATIENT_CLINIC_OR_DEPARTMENT_OTHER): Payer: PPO | Attending: Physician Assistant

## 2015-02-03 VITALS — Ht 73.0 in | Wt 250.0 lb

## 2015-02-03 DIAGNOSIS — I4819 Other persistent atrial fibrillation: Secondary | ICD-10-CM

## 2015-02-03 DIAGNOSIS — R0683 Snoring: Secondary | ICD-10-CM | POA: Insufficient documentation

## 2015-02-03 DIAGNOSIS — G4733 Obstructive sleep apnea (adult) (pediatric): Secondary | ICD-10-CM | POA: Insufficient documentation

## 2015-02-03 DIAGNOSIS — G4730 Sleep apnea, unspecified: Secondary | ICD-10-CM | POA: Diagnosis present

## 2015-02-03 DIAGNOSIS — R0681 Apnea, not elsewhere classified: Secondary | ICD-10-CM

## 2015-02-03 DIAGNOSIS — I493 Ventricular premature depolarization: Secondary | ICD-10-CM | POA: Diagnosis not present

## 2015-02-09 ENCOUNTER — Telehealth: Payer: Self-pay | Admitting: Cardiology

## 2015-02-09 NOTE — Sleep Study (Signed)
   NAME: [name] DATE OF BIRTH:  07-04-1935 MEDICAL RECORD NUMBER [id number]  LOCATION: [facility]  PHYSICIAN: [name],[name]  DATE OF STUDY: 02/03/2015  SLEEP STUDY TYPE: Nocturnal Polysomnogram               REFERRING PHYSICIAN: Richardson Dopp T, PA-C  INDICATION FOR STUDY: witnessed apnea  EPWORTH SLEEPINESS SCORE: 5 HEIGHT: 6\' 1"  (185.4 cm)  WEIGHT: 250 lb (113.399 kg)    Body mass index is 32.99 kg/(m^2).  NECK SIZE: 18 in.  MEDICATIONS: reviewed in the record  SLEEP ARCHITECTURE: The patient slept for a total of 294 minutes out of a total sleep period time of 352 minutes.  There was no slow wave sleep and 62 minutes of REM sleep.  The onset to sleep latency was 10 minutes.  The onset to REM sleep latency was short at 67 minutes.  The sleep efficiency was mildly reduced at 81%.    RESPIRATORY DATA: There were 32 apneas, of which, 29 were obstructive, 1 was central and 2 were mixed.  There were 20 hypopneas.  The total AHI was 10.6 events per hour.  This is consistent with mild obstructive sleep apnea/hypopnea syndrome.  The AHI during REM sleep was 38 events per hour.  Most events occurred during REM sleep and in the non-supine position.  There was mild snoring.  OXYGEN DATA: The average oxygen saturation was 93%.  The lowest oxygen saturation was 84%.  The time spent with oxygen saturations < 88% was 4.3 minutes.    CARDIAC DATA: The patient maintained sinus bradycardia to NSR with PVC's during the study.  The average heart rate was 47 bpm.   The lowest heart rate was 34 bpm.    MOVEMENT/PARASOMNIA: There were no periodic limb movements or REM sleep behavior disorders.  IMPRESSION/ RECOMMENDATION:   1.  Mild obstructive sleep apnea/hypopnea syndrome with an AHI of 10.6 events per hour.  The AHI was 38 per hour during REM sleep.  Most events occurred during REM sleep and in the non-supine position. 2.  Mild snoring was noted. 3.  There were occasional  PVC's.   4.  Reduced sleep efficiency with increased frequency of arousals due to spontaneous events.  5.  Oxygen desaturations associated with respiratory events as low as 84%.  The time spent with oxygen saturations < 88% was 4.3 minutes.   6.  Abnormal sleep architecture with no slow wave sleep. 7.  ENT referral for mild OSA and snoring, referral to dentistry for oral device or CPAP titration would be appropriate.  Treatment would also include careful attention to proper sleep hygiene, weight reduction for elevated BMI, avoidance of sleeping in the supine position and avoidance of alcohol within four hours of bedtime.  Specific treatment decisions should be tailored to each patient based upon the clinical situation and all treatment options should be considered.  The patient should be instructed to avoid driving if sleepy and careful clinical follow up is needed to ensure that the patient's symptoms are improving with therapy and the PAP adherence is supported and measured if prescribed.    Signed: Sueanne Margarita Diplomate, American Board of Sleep Medicine  ELECTRONICALLY SIGNED ON:  02/09/2015, 8:42 PM [place] PH: [phone]   FX: [phone] [place]

## 2015-02-09 NOTE — Telephone Encounter (Signed)
Given patients CHF and afib I have reviewed findings of sleep study and since he has mild OSA with some oxygen desaturations - please set up for CPAP titratoin

## 2015-02-09 NOTE — Telephone Encounter (Signed)
Please let patient know that they have mild sleep apnea.   Please set up OV at next available to discuss treatment options.

## 2015-02-10 NOTE — Telephone Encounter (Signed)
Patient stated that he does not want to go through this right now. He stated that he has an appointment to see Dr. Radford Pax in august and he wanted to talk to her face to face to discuss this.

## 2015-02-10 NOTE — Telephone Encounter (Signed)
Left message to call back  

## 2015-03-10 ENCOUNTER — Other Ambulatory Visit: Payer: Self-pay

## 2015-03-10 ENCOUNTER — Other Ambulatory Visit: Payer: Self-pay | Admitting: Cardiology

## 2015-03-10 MED ORDER — LOSARTAN POTASSIUM 25 MG PO TABS: 25.0000 mg | ORAL_TABLET | Freq: Every day | ORAL | Status: DC

## 2015-03-21 ENCOUNTER — Telehealth: Payer: Self-pay | Admitting: *Deleted

## 2015-03-21 NOTE — Telephone Encounter (Signed)
Medication samples have been provided to the patient.  Drug name: eliquis 5mg   Qty: 24  LOT: [id number]  Exp.Date: 12/18  Samples given to patient.   Sheral Apley M 1:54 PM 03/21/2015

## 2015-04-10 ENCOUNTER — Ambulatory Visit (INDEPENDENT_AMBULATORY_CARE_PROVIDER_SITE_OTHER): Payer: PPO | Admitting: Cardiology

## 2015-04-10 ENCOUNTER — Other Ambulatory Visit: Payer: Self-pay | Admitting: *Deleted

## 2015-04-10 ENCOUNTER — Encounter: Payer: Self-pay | Admitting: Cardiology

## 2015-04-10 ENCOUNTER — Other Ambulatory Visit (INDEPENDENT_AMBULATORY_CARE_PROVIDER_SITE_OTHER): Payer: PPO | Admitting: *Deleted

## 2015-04-10 VITALS — BP 140/62 | HR 61 | Ht 72.0 in | Wt 260.0 lb

## 2015-04-10 DIAGNOSIS — Z7901 Long term (current) use of anticoagulants: Secondary | ICD-10-CM

## 2015-04-10 DIAGNOSIS — I480 Paroxysmal atrial fibrillation: Secondary | ICD-10-CM

## 2015-04-10 DIAGNOSIS — G4733 Obstructive sleep apnea (adult) (pediatric): Secondary | ICD-10-CM

## 2015-04-10 DIAGNOSIS — I481 Persistent atrial fibrillation: Secondary | ICD-10-CM

## 2015-04-10 DIAGNOSIS — I4819 Other persistent atrial fibrillation: Secondary | ICD-10-CM

## 2015-04-10 DIAGNOSIS — I2510 Atherosclerotic heart disease of native coronary artery without angina pectoris: Secondary | ICD-10-CM

## 2015-04-10 DIAGNOSIS — I420 Dilated cardiomyopathy: Secondary | ICD-10-CM | POA: Diagnosis not present

## 2015-04-10 DIAGNOSIS — E669 Obesity, unspecified: Secondary | ICD-10-CM

## 2015-04-10 DIAGNOSIS — I77810 Thoracic aortic ectasia: Secondary | ICD-10-CM

## 2015-04-10 DIAGNOSIS — I5042 Chronic combined systolic (congestive) and diastolic (congestive) heart failure: Secondary | ICD-10-CM

## 2015-04-10 DIAGNOSIS — I5023 Acute on chronic systolic (congestive) heart failure: Secondary | ICD-10-CM

## 2015-04-10 DIAGNOSIS — I10 Essential (primary) hypertension: Secondary | ICD-10-CM

## 2015-04-10 LAB — HEPATIC FUNCTION PANEL
ALT: 26 U/L (ref 0–53)
AST: 28 U/L (ref 0–37)
Albumin: 4 g/dL (ref 3.5–5.2)
Alkaline Phosphatase: 57 U/L (ref 39–117)
Bilirubin, Direct: 0.1 mg/dL (ref 0.0–0.3)
TOTAL PROTEIN: 7.3 g/dL (ref 6.0–8.3)
Total Bilirubin: 0.5 mg/dL (ref 0.2–1.2)

## 2015-04-10 LAB — TSH: TSH: 1.06 u[IU]/mL (ref 0.35–4.50)

## 2015-04-10 MED ORDER — LOSARTAN POTASSIUM 25 MG PO TABS: 25.0000 mg | ORAL_TABLET | Freq: Every day | ORAL | Status: DC

## 2015-04-10 MED ORDER — AMIODARONE HCL 200 MG PO TABS: 200.0000 mg | ORAL_TABLET | Freq: Every day | ORAL | Status: DC

## 2015-04-10 MED ORDER — FUROSEMIDE 40 MG PO TABS: 40.0000 mg | ORAL_TABLET | Freq: Two times a day (BID) | ORAL | Status: DC

## 2015-04-10 MED ORDER — METOPROLOL SUCCINATE ER 25 MG PO TB24: 25.0000 mg | ORAL_TABLET | Freq: Two times a day (BID) | ORAL | Status: DC

## 2015-04-10 NOTE — Patient Instructions (Signed)
Medication Instructions:  Your physician recommends that you continue on your current medications as directed. Please refer to the Current Medication list given to you today.   Labwork: Your physician recommends that you return for FASTING lab work WITHIN ONE WEEK: LFTs, LIPIDS   Testing/Procedures: Dr. Radford Pax recommends you have a CPAP titration.  Follow-Up: Your physician wants you to follow-up in: 6 months with Dr. Radford Pax. You will receive a reminder letter in the mail two months in advance. If you don't receive a letter, please call our office to schedule the follow-up appointment.   Any Other Special Instructions Will Be Listed Below (If Applicable).

## 2015-04-10 NOTE — Progress Notes (Signed)
Cardiology Office Note   Date:  04/10/2015   ID:  MOMODOU CONSIGLIO, DOB 03/30/1935, MRN [id number]  PCP:  Mathews Argyle, MD    Chief Complaint  Patient presents with  . Follow-up    cad, HTN, CHF      History of Present Illness: [name] is a 80 y.o. male who presents for followup of atrial fibrillation.He has a history of PAF s/p DCCV 10/2014 and is on Eliquis.  He has mild LV dysfunction EF 40-45% and dilated aortic root at 53mm with mild AI. He has ASCAD by cath with  30% LM at the ostium, 70-80% ostial D2, 30-40% prox LCX, 30-40% prox and 40% mid RCA and EF 40%. He reverted back to atrial fibrillation.  With continued LV dysfunction, rhythm control was felt to be warranted. He was placed on amiodarone. He underwent repeat DCCV 01/09/15 with restoration of NSR. He is feeling much better. Breathing is improved. He has been riding his bike (5-10 miles a day). He is NYHA 2-2b. He denies orthopnea, PND. He denies any chest pain, SOB, DOE, palpitations, dizziness or syncope.   He denies LE edema. He rides his bike 5 miles a day.   He underwent PSG showing mild OSA with an AHI of 10.6/hr but 38/hr during REM sleep.  O2 sats dropped to 84% with respiratory events.  He has not made up his mind if he wants to proceed with CPAP titration.  Past Medical History  Diagnosis Date  . Hyperlipidemia   . DJD (degenerative joint disease)     feet, knees and back  . GERD (gastroesophageal reflux disease)     hiatal hernia  . Obesity   . Renal cyst, left 2010    5 cm seen on ultrasound   . Lesion of left femoral nerve     3 cm MRI 04/2010 consistent with hemangioma  . PSA (psoriatic arthritis)     Mild increase 3.65 on 05/2011 stable 3.68 on 09/2011  . Back pain   . Mild aortic insufficiency   . Persistent atrial fibrillation 09/17/2014  . Dilated aortic root 09/17/2014  . DCM (dilated cardiomyopathy) 09/17/2014    a.  EF 40%; b. Echo 5/16:  EF 40-45%, diff HK, inf AK,  mild AI, MAC, severe LAE  . CAD (coronary artery disease), native coronary artery 09/20/2014    30% LM at the ostium, 70-80% ostial D2, 30-40% prox LCX, 30-40% prox and 40% mid RCA and EF 40%.    . Benign essential HTN 09/17/2014    Past Surgical History  Procedure Laterality Date  . Left heart catheterization with coronary angiogram N/A 10/01/2014    Procedure: LEFT HEART CATHETERIZATION WITH CORONARY ANGIOGRAM;  Surgeon: Troy Sine, MD;  Location: [facility] CATH LAB;  Service: Cardiovascular;  Laterality: N/A;  . Cardioversion N/A 11/04/2014    Procedure: CARDIOVERSION;  Surgeon: Sueanne Margarita, MD;  Location: [facility] ENDOSCOPY;  Service: Cardiovascular;  Laterality: N/A;  . Cardioversion N/A 01/09/2015    Procedure: CARDIOVERSION;  Surgeon: Pixie Casino, MD;  Location: [facility] ENDOSCOPY;  Service: Cardiovascular;  Laterality: N/A;     Current Outpatient Prescriptions  Medication Sig Dispense Refill  . acetaminophen (TYLENOL) 650 MG CR tablet Take 650 mg by mouth every 8 (eight) hours as needed for pain.    Marland Kitchen amiodarone (PACERONE) 200 MG tablet Take 1 tablet (200 mg total) by mouth daily.    Marland Kitchen  apixaban (ELIQUIS) 5 MG TABS tablet Take 1 tablet (5 mg total) by mouth 2 (two) times daily. 180 tablet 3  . Calcium Carb-Cholecalciferol 500-400 MG-UNIT TABS Take 1 tablet by mouth daily.    . furosemide (LASIX) 40 MG tablet Take 1 tablet (40 mg total) by mouth 2 (two) times daily. 60 tablet 11  . loratadine (CLARITIN) 10 MG tablet Take 10 mg by mouth daily as needed for allergies.    Marland Kitchen losartan (COZAAR) 25 MG tablet Take 1 tablet (25 mg total) by mouth daily. 30 tablet 5  . meloxicam (MOBIC) 15 MG tablet Take 15 mg by mouth daily.    . metoprolol succinate (TOPROL XL) 25 MG 24 hr tablet Take 1 tablet (25 mg total) by mouth 2 (two) times daily. 60 tablet 11  . MULTIPLE VITAMINS PO Take 1 tablet by mouth daily.    . polyethylene glycol (MIRALAX / GLYCOLAX) packet Take 17 g by mouth daily as needed (constipation).      . pravastatin (PRAVACHOL) 10 MG tablet Take 10 mg by mouth daily.     No current facility-administered medications for this visit.    Allergies:   Simvastatin; Codeine phosphate; and Penicillins    Social History:  The patient  reports that he has quit smoking. He does not have any smokeless tobacco history on file. He reports that he does not drink alcohol or use illicit drugs.   Family History:  The patient's family history includes Atrial fibrillation in his sister; Cancer in his mother; Diabetes in his father; Heart attack in his maternal grandmother, paternal grandfather, and paternal grandmother; Heart disease in his sister; Stroke in his maternal grandfather.    ROS:  Please see the history of present illness.   Otherwise, review of systems are positive for none.   All other systems are reviewed and negative.    PHYSICAL EXAM: VS:  BP 140/62 mmHg  Pulse 61  Ht 6' (1.829 m)  Wt 260 lb (117.935 kg)  BMI 35.25 kg/m2  SpO2 97% , BMI Body mass index is 35.25 kg/(m^2). GEN: Well nourished, well developed, in no acute distress HEENT: normal Neck: no JVD, carotid bruits, or masses Cardiac: RRR; no murmurs, rubs, or gallops,no edema  Respiratory:  clear to auscultation bilaterally, normal work of breathing GI: soft, nontender, nondistended, + BS MS: no deformity or atrophy Skin: warm and dry, no rash Neuro:  Strength and sensation are intact Psych: euthymic mood, full affect   EKG:  EKG is not ordered today.    Recent Labs: 10/31/2014: Hemoglobin 16.7; Platelets 199.0 12/26/2014: ALT 27; Pro B Natriuretic peptide (BNP) 211.0*; TSH 1.21 01/22/2015: BUN 44*; Creatinine, Ser 1.20; Potassium 4.4; Sodium 136    Lipid Panel    Component Value Date/Time   CHOL 185 01/03/2015 1023   TRIG 138.0 01/03/2015 1023   HDL 40.20 01/03/2015 1023   CHOLHDL 5 01/03/2015 1023   VLDL 27.6 01/03/2015 1023   LDLCALC 117* 01/03/2015 1023      Wt Readings from Last 3 Encounters:    04/10/15 260 lb (117.935 kg)  02/03/15 250 lb (113.399 kg)  01/22/15 257 lb (116.574 kg)    ASSESSMENT AND PLAN:  1. Chronic Combined Systolic and Diastolic CHF: Volume remains slightly increased with LE edema. I suspect his afib is driving this.. Continue current therapy with Lasix/BB/ARB. 2. Paroxysmal Atrial Fibrillation:maintaining NSR.Continue Amio and Eliquis  3. Non-Ischemic CM: Probably related to AFib (?tachycardia mediated). Continue ARB, beta blocker. EF 40% 4. CAD:  Non-obstructive by recent LHC. He is not on ASA as he is on Eliquis. Continue statin and beta blocker 5. HTN: Controlled on current regimen.  6. Hyperlipidemia: Continue  statin. Snow FLP and ALT 7. Dilated Ascending Aorta/Aortic Root: Repeat Echo due in 07/2015.  8.  Mild OSA - We have discussed the correlation of PAF and OSA and he has agreed to proceed with CPAP titration.     Current medicines are reviewed at length with the patient today.  The patient does not have concerns regarding medicines.  The following changes have been made:  no change  Labs/ tests ordered today: See above Assessment and Plan No orders of the defined types were placed in this encounter.     Disposition:   FU with me in 6 months  Signed, Sueanne Margarita, MD  04/10/2015 3:01 PM    [facility] [place], [place], [place]  [address] Phone: [phone]; Fax: [phone]

## 2015-04-11 ENCOUNTER — Telehealth: Payer: Self-pay | Admitting: *Deleted

## 2015-04-11 NOTE — Telephone Encounter (Signed)
lmom that identified pt by name; lab work ok and to continue current dose of amiodarone. Any questions cb [phone].

## 2015-04-17 ENCOUNTER — Other Ambulatory Visit (INDEPENDENT_AMBULATORY_CARE_PROVIDER_SITE_OTHER): Payer: PPO | Admitting: *Deleted

## 2015-04-17 ENCOUNTER — Telehealth: Payer: Self-pay

## 2015-04-17 DIAGNOSIS — I2510 Atherosclerotic heart disease of native coronary artery without angina pectoris: Secondary | ICD-10-CM

## 2015-04-17 DIAGNOSIS — E785 Hyperlipidemia, unspecified: Secondary | ICD-10-CM

## 2015-04-17 LAB — LIPID PANEL
CHOLESTEROL: 192 mg/dL (ref 0–200)
HDL: 43.8 mg/dL (ref >39.00)
LDL Cholesterol: 127 mg/dL — ABNORMAL HIGH (ref 0–99)
NonHDL: 147.77
TRIGLYCERIDES: 103 mg/dL (ref 0.0–149.0)
Total CHOL/HDL Ratio: 4
VLDL: 20.6 mg/dL (ref 0.0–40.0)

## 2015-04-17 LAB — HEPATIC FUNCTION PANEL
ALT: 27 U/L (ref 0–53)
AST: 24 U/L (ref 0–37)
Albumin: 3.9 g/dL (ref 3.5–5.2)
Alkaline Phosphatase: 53 U/L (ref 39–117)
BILIRUBIN TOTAL: 0.6 mg/dL (ref 0.2–1.2)
Bilirubin, Direct: 0.1 mg/dL (ref 0.0–0.3)
TOTAL PROTEIN: 7.2 g/dL (ref 6.0–8.3)

## 2015-04-17 MED ORDER — PRAVASTATIN SODIUM 40 MG PO TABS: 40.0000 mg | ORAL_TABLET | Freq: Every day | ORAL | Status: DC

## 2015-04-17 NOTE — Telephone Encounter (Signed)
Informed patient of results and verbal understanding expressed.   Instructed patient to INCREASE PRAVASTATIN to 40 mg daily. FLP and ALT scheduled for 10/17. Patient agrees with treatment plan.

## 2015-04-17 NOTE — Telephone Encounter (Signed)
-----   Message from Sueanne Margarita, MD sent at 04/17/2015  3:32 PM EDT ----- LDL not at goal - increase pravastatin to 40mg  daily and recheck FLP and ALT in 6 weeks

## 2015-05-15 ENCOUNTER — Telehealth: Payer: Self-pay | Admitting: *Deleted

## 2015-05-15 NOTE — Telephone Encounter (Signed)
Patient came to the office for eliquis samples. Samples provided to patient.

## 2015-06-02 ENCOUNTER — Other Ambulatory Visit (INDEPENDENT_AMBULATORY_CARE_PROVIDER_SITE_OTHER): Payer: PPO | Admitting: *Deleted

## 2015-06-02 ENCOUNTER — Telehealth: Payer: Self-pay

## 2015-06-02 ENCOUNTER — Telehealth: Payer: Self-pay | Admitting: Cardiology

## 2015-06-02 DIAGNOSIS — E785 Hyperlipidemia, unspecified: Secondary | ICD-10-CM | POA: Diagnosis not present

## 2015-06-02 LAB — LIPID PANEL
Cholesterol: 200 mg/dL (ref 125–200)
HDL: 50 mg/dL (ref >=40)
LDL Cholesterol: 127 mg/dL (ref <130)
TRIGLYCERIDES: 115 mg/dL (ref <150)
Total CHOL/HDL Ratio: 4 Ratio (ref <=5.0)
VLDL: 23 mg/dL (ref <30)

## 2015-06-02 LAB — HEPATIC FUNCTION PANEL
ALK PHOS: 50 U/L (ref 40–115)
ALT: 29 U/L (ref 9–46)
AST: 32 U/L (ref 10–35)
Albumin: 4.1 g/dL (ref 3.6–5.1)
BILIRUBIN TOTAL: 0.6 mg/dL (ref 0.2–1.2)
Bilirubin, Direct: 0.1 mg/dL (ref <=0.2)
Indirect Bilirubin: 0.5 mg/dL (ref 0.2–1.2)
Total Protein: 7.8 g/dL (ref 6.1–8.1)

## 2015-06-02 NOTE — Telephone Encounter (Signed)
Pt needs samples of Eliquis please

## 2015-06-02 NOTE — Telephone Encounter (Signed)
Eliquis 5 mg samples left at front desk of [facility] office for pick up.

## 2015-06-02 NOTE — Addendum Note (Signed)
Addended by: Eulis Foster on: 06/02/2015 10:23 AM   Modules accepted: Orders

## 2015-06-02 NOTE — Addendum Note (Signed)
Addended by: Eulis Foster on: 06/02/2015 10:22 AM   Modules accepted: Orders

## 2015-06-02 NOTE — Telephone Encounter (Signed)
Patient here for lab work, also requesting samples of Eliquis 5mg . Advised him we are currently out of samples. He has a rx he can fill if needed. He states he may call back tomorrow to check.

## 2015-06-04 ENCOUNTER — Telehealth: Payer: Self-pay

## 2015-06-04 DIAGNOSIS — E785 Hyperlipidemia, unspecified: Secondary | ICD-10-CM

## 2015-06-04 MED ORDER — PRAVASTATIN SODIUM 80 MG PO TABS: 80.0000 mg | ORAL_TABLET | Freq: Every day | ORAL | Status: DC

## 2015-06-04 NOTE — Telephone Encounter (Signed)
-----   Message from Sueanne Margarita, MD sent at 06/02/2015 11:00 PM EDT ----- LDL not at goal - increase pravastatin to 80mg  daily and recheck FLP and ALT in 6 weeks

## 2015-06-04 NOTE — Telephone Encounter (Signed)
Informed patient of results and verbal understanding expressed.  Instructed patient to INCREASE PRAVASTATIN to 80 mg daily. FLP and ALT scheduled for December 5. Patient agrees with treatment plan.

## 2015-06-30 ENCOUNTER — Ambulatory Visit (HOSPITAL_BASED_OUTPATIENT_CLINIC_OR_DEPARTMENT_OTHER): Payer: PPO | Attending: Cardiology | Admitting: Radiology

## 2015-06-30 DIAGNOSIS — G4733 Obstructive sleep apnea (adult) (pediatric): Secondary | ICD-10-CM | POA: Insufficient documentation

## 2015-06-30 DIAGNOSIS — Z79899 Other long term (current) drug therapy: Secondary | ICD-10-CM | POA: Insufficient documentation

## 2015-06-30 DIAGNOSIS — I493 Ventricular premature depolarization: Secondary | ICD-10-CM | POA: Diagnosis not present

## 2015-06-30 DIAGNOSIS — G4730 Sleep apnea, unspecified: Secondary | ICD-10-CM | POA: Diagnosis present

## 2015-07-13 ENCOUNTER — Telehealth: Payer: Self-pay | Admitting: Cardiology

## 2015-07-13 NOTE — Telephone Encounter (Signed)
Pt had successful PAP titration. Please setup appointment in 10 weeks. Please let [facility] know that order for PAP is in EPIC.   

## 2015-07-13 NOTE — Addendum Note (Signed)
Addended by: Fransico Him R on: 07/13/2015 03:45 PM   Modules accepted: Orders

## 2015-07-13 NOTE — Sleep Study (Addendum)
  Patient Name: [name], [name] MRN: [id number] Study Date: 06/30/2015 Gender: Male D.O.B: 08-29-1934 Age (years): 44 Referring Provider: Fransico Him MD, ABSM Interpreting Physician: Fransico Him MD, ABSM RPSGT: Zadie Rhine  Weight (lbs): 250 BMI: 34 Height (inches): 72  Neck Size: 18.00  CLINICAL INFORMATION The patient is referred for a CPAP titration to treat sleep apnea. Date of NPSG, Split Night or HST:06/30/2015  SLEEP STUDY TECHNIQUE As per the AASM Manual for the Scoring of Sleep and Associated Events v2.3 (April 2016) with a hypopnea requiring 4% desaturations. The channels recorded and monitored were frontal, central and occipital EEG, electrooculogram (EOG), submentalis EMG (chin), nasal and oral airflow, thoracic and abdominal wall motion, anterior tibialis EMG, snore microphone, electrocardiogram, and pulse oximetry. Continuous positive airway pressure (CPAP) was initiated at the beginning of the study and titrated to treat sleep-disordered breathing.  MEDICATIONS Medications taken by the patient : Amiodarone, Lasix, Claritin, Losartan, Meloxicam, Metoprolol, Miralax, Pravastatin Medications administered by patient during sleep study : No sleep medicine administered.  TECHNICIAN COMMENTS Comments added by technician: Patient had difficulty initiating sleep. PT WENT TO RESTROOM TWICE. NO MEDICATION WAS TAKEN. Patient was restless all through the night.  Comments added by scorer: N/A  RESPIRATORY PARAMETERS Optimal PAP Pressure (cm): 9  AHI at Optimal Pressure (/hr):2.2 Overall Minimal O2 (%):89.00   Supine % at Optimal Pressure (%):N/A Minimal O2 at Optimal Pressure (%):95.0     SLEEP ARCHITECTURE The study was initiated at 10:45:48 PM and ended at 4:51:51 AM. Sleep onset time was 13.7 minutes and the sleep efficiency was reduced at 55.9%. The total sleep time was 204.5 minutes. The patient spent 10.76% of the night in stage N1 sleep, 61.37% in stage N2 sleep,  0.00% in stage N3 and 27.87% in REM.  Stage REM latency was prolonged at 208.5 minutes Wake after sleep onset was 147.8. Alpha intrusion was absent. Supine sleep was 0.00%.  CARDIAC DATA The 2 lead EKG demonstrated sinus rhythm. The mean heart rate was 45.80 beats per minute. Other EKG findings include: PVCs.  LEG MOVEMENT DATA The total Periodic Limb Movements of Sleep (PLMS) were 0. The PLMS index was 0.00. A PLMS index of <15 is considered normal in adults.  IMPRESSIONS - The optimal PAP pressure was 9 cm of water. - Central sleep apnea was not noted during this titration (CAI = 0.0/h). - Mild oxygen desaturations were observed during this titration (min O2 = 89.00%). - No snoring was audible during this study. - 2-lead EKG demonstrated: PVCs - Clinically significant periodic limb movements were not noted during this study. Arousals associated with PLMs were rare.  DIAGNOSIS - Obstructive Sleep Apnea (327.23 [G47.33 ICD-10])  RECOMMENDATIONS - Trial of CPAP therapy on 9 cm H2O with a Large size Fisher&Paykel Full Face Mask Simplus mask and heated humidification. - Avoid alcohol, sedatives and other CNS depressants that may worsen sleep apnea and disrupt normal sleep architecture. - Sleep hygiene should be reviewed to assess factors that may improve sleep quality. - Weight management and regular exercise should be initiated or continued. - Return to [facility] Center for re-evaluation after 10 weeks of therapy   [place], American Board of Sleep Medicine  ELECTRONICALLY SIGNED ON:  07/13/2015, 3:32 PM [place] PH: [phone]   FX: [phone] [place]

## 2015-07-15 NOTE — Telephone Encounter (Signed)
Patient information has been

## 2015-07-15 NOTE — Telephone Encounter (Signed)
Patient information has been sent to [facility]   Dr. Claiborne Billings will see patient for follow-up.  Patient is aware that someone from [facility] will call him.

## 2015-07-18 ENCOUNTER — Encounter: Payer: Self-pay | Admitting: Cardiology

## 2015-07-21 ENCOUNTER — Other Ambulatory Visit (INDEPENDENT_AMBULATORY_CARE_PROVIDER_SITE_OTHER): Payer: PPO | Admitting: *Deleted

## 2015-07-21 ENCOUNTER — Telehealth: Payer: Self-pay

## 2015-07-21 DIAGNOSIS — E785 Hyperlipidemia, unspecified: Secondary | ICD-10-CM | POA: Diagnosis not present

## 2015-07-21 LAB — LIPID PANEL
Cholesterol: 168 mg/dL (ref 125–200)
HDL: 44 mg/dL (ref >=40)
LDL Cholesterol: 87 mg/dL (ref <130)
TRIGLYCERIDES: 185 mg/dL — AB (ref <150)
Total CHOL/HDL Ratio: 3.8 Ratio (ref <=5.0)
VLDL: 37 mg/dL — ABNORMAL HIGH (ref <30)

## 2015-07-21 LAB — HEPATIC FUNCTION PANEL
ALBUMIN: 3.9 g/dL (ref 3.6–5.1)
ALK PHOS: 54 U/L (ref 40–115)
ALT: 32 U/L (ref 9–46)
AST: 31 U/L (ref 10–35)
BILIRUBIN INDIRECT: 0.4 mg/dL (ref 0.2–1.2)
Bilirubin, Direct: 0.1 mg/dL (ref <=0.2)
TOTAL PROTEIN: 7 g/dL (ref 6.1–8.1)
Total Bilirubin: 0.5 mg/dL (ref 0.2–1.2)

## 2015-07-21 NOTE — Telephone Encounter (Signed)
Samples of Eliquis 5 mg, 3 boxes provided to patient.

## 2015-07-21 NOTE — Addendum Note (Signed)
Addended by: Eulis Foster on: 07/21/2015 12:15 PM   Modules accepted: Orders

## 2015-07-23 ENCOUNTER — Telehealth: Payer: Self-pay | Admitting: Cardiology

## 2015-07-23 DIAGNOSIS — E785 Hyperlipidemia, unspecified: Secondary | ICD-10-CM

## 2015-07-23 MED ORDER — ROSUVASTATIN CALCIUM 20 MG PO TABS: 20.0000 mg | ORAL_TABLET | Freq: Every day | ORAL | Status: DC

## 2015-07-23 NOTE — Telephone Encounter (Signed)
-----   Message from Sueanne Margarita, MD sent at 07/22/2015  9:55 PM EST ----- Stop pravastatin and start Crestor 20mg  daily and rechecking lipid and liver panel in 3 months.

## 2015-07-23 NOTE — Telephone Encounter (Signed)
New message ° ° ° ° ° °Returning a call to get lab results °

## 2015-07-23 NOTE — Telephone Encounter (Signed)
Informed patient of results and verbal understanding expressed.  Instructed patient to STOP PRAVASTATIN and START CRESTOR 20 mg daily. FLP and LFTs ordered and recall placed for labs to be scheduled in 3 months. Patient agrees with treatment plan.

## 2015-07-23 NOTE — Telephone Encounter (Signed)
Patient st he was not fasting for his last labs. He had eaten eggs and a sausage biscuit. Repeat lab work scheduled for Monday, 12/12. Patient understands to continue pravastatin 80 mg daily until labs are resulted.

## 2015-07-23 NOTE — Telephone Encounter (Signed)
New Message   Pt wants you to know his labs that he took was not fasting

## 2015-07-28 ENCOUNTER — Other Ambulatory Visit: Payer: PPO

## 2015-07-29 ENCOUNTER — Other Ambulatory Visit (INDEPENDENT_AMBULATORY_CARE_PROVIDER_SITE_OTHER): Payer: PPO | Admitting: *Deleted

## 2015-07-29 DIAGNOSIS — E785 Hyperlipidemia, unspecified: Secondary | ICD-10-CM | POA: Diagnosis not present

## 2015-07-29 LAB — LIPID PANEL
CHOL/HDL RATIO: 3.4 ratio (ref <=5.0)
Cholesterol: 160 mg/dL (ref 125–200)
HDL: 47 mg/dL (ref >=40)
LDL CALC: 89 mg/dL (ref <130)
Triglycerides: 122 mg/dL (ref <150)
VLDL: 24 mg/dL (ref <30)

## 2015-07-29 LAB — HEPATIC FUNCTION PANEL
ALBUMIN: 3.8 g/dL (ref 3.6–5.1)
ALT: 29 U/L (ref 9–46)
AST: 34 U/L (ref 10–35)
Alkaline Phosphatase: 52 U/L (ref 40–115)
BILIRUBIN TOTAL: 0.7 mg/dL (ref 0.2–1.2)
Bilirubin, Direct: 0.1 mg/dL (ref <=0.2)
Indirect Bilirubin: 0.6 mg/dL (ref 0.2–1.2)
Total Protein: 7.2 g/dL (ref 6.1–8.1)

## 2015-07-30 ENCOUNTER — Telehealth: Payer: Self-pay | Admitting: Cardiology

## 2015-07-30 NOTE — Telephone Encounter (Signed)
-----   Message from Sueanne Margarita, MD sent at 07/22/2015 9:55 PM EST ----- Stop pravastatin and start Crestor 20mg  daily and rechecking lipid and liver panel in 3 months.  Labs were redrawn due to patient not fasting for previous results. Instructed patient to STOP PRAVASTATIN and START CRESTOR 20 mg. Patient will have fasting labs drawn at next [facility] in March.

## 2015-07-30 NOTE — Telephone Encounter (Signed)
-----   Message from Sueanne Margarita, MD sent at 07/30/2015  9:56 AM EST ----- Please make sure patient was changed to crestor

## 2015-07-30 NOTE — Telephone Encounter (Signed)
New message ° ° ° ° °Returning a call to the nurse °

## 2015-08-13 ENCOUNTER — Telehealth: Payer: Self-pay

## 2015-08-13 NOTE — Telephone Encounter (Signed)
Patient here with his wife for an appointment. Requests samples of Eliquis. Provided 2 boxes of Eliquis 5mg  to patient.

## 2015-08-26 DIAGNOSIS — G4733 Obstructive sleep apnea (adult) (pediatric): Secondary | ICD-10-CM | POA: Diagnosis not present

## 2015-08-26 DIAGNOSIS — Z9660 Presence of unspecified orthopedic joint implant: Secondary | ICD-10-CM | POA: Diagnosis not present

## 2015-09-19 ENCOUNTER — Telehealth: Payer: Self-pay | Admitting: *Deleted

## 2015-09-19 ENCOUNTER — Other Ambulatory Visit (INDEPENDENT_AMBULATORY_CARE_PROVIDER_SITE_OTHER): Payer: PPO | Admitting: *Deleted

## 2015-09-19 DIAGNOSIS — I10 Essential (primary) hypertension: Secondary | ICD-10-CM | POA: Diagnosis not present

## 2015-09-19 LAB — BASIC METABOLIC PANEL
BUN: 40 mg/dL — AB (ref 7–25)
CHLORIDE: 103 mmol/L (ref 98–110)
CO2: 26 mmol/L (ref 20–31)
CREATININE: 1.17 mg/dL (ref 0.70–1.18)
Calcium: 9.3 mg/dL (ref 8.6–10.3)
Glucose, Bld: 98 mg/dL (ref 65–99)
Potassium: 4.3 mmol/L (ref 3.5–5.3)
Sodium: 139 mmol/L (ref 135–146)

## 2015-09-19 NOTE — Addendum Note (Signed)
Addended by: Eulis Foster on: 09/19/2015 02:56 PM   Modules accepted: Orders

## 2015-09-19 NOTE — Telephone Encounter (Signed)
Patient here for lab work. He requested eliquis samples. Samples provided.

## 2015-09-22 ENCOUNTER — Ambulatory Visit (INDEPENDENT_AMBULATORY_CARE_PROVIDER_SITE_OTHER)
Admission: RE | Admit: 2015-09-22 | Discharge: 2015-09-22 | Disposition: A | Discharge status: Home / Self Care | Payer: PPO | Source: Ambulatory Visit | Attending: Cardiology | Admitting: Cardiology

## 2015-09-22 DIAGNOSIS — I77810 Thoracic aortic ectasia: Secondary | ICD-10-CM | POA: Diagnosis not present

## 2015-09-22 DIAGNOSIS — I712 Thoracic aortic aneurysm, without rupture: Secondary | ICD-10-CM | POA: Diagnosis not present

## 2015-09-22 MED ORDER — IOHEXOL 350 MG/ML SOLN
100.0000 mL | Freq: Once | INTRAVENOUS | Status: AC | PRN
  Administered 2015-09-22: 100 mL via INTRAVENOUS

## 2015-09-23 ENCOUNTER — Telehealth: Payer: Self-pay

## 2015-09-23 DIAGNOSIS — I7121 Aneurysm of the ascending aorta, without rupture: Secondary | ICD-10-CM

## 2015-09-23 DIAGNOSIS — I714 Abdominal aortic aneurysm, without rupture, unspecified: Secondary | ICD-10-CM

## 2015-09-23 DIAGNOSIS — I712 Thoracic aortic aneurysm, without rupture: Secondary | ICD-10-CM

## 2015-09-23 NOTE — Telephone Encounter (Signed)
Informed patient of results and verbal understanding expressed.  Repeat CT ordered to be scheduled in 1 year. Patient agrees with treatment plan.  

## 2015-09-23 NOTE — Telephone Encounter (Signed)
-----   Message from Sueanne Margarita, MD sent at 09/22/2015  1:27 PM EST ----- Stable ascending aortic aneurysm - repeat Chest CT in 1 year

## 2015-09-26 DIAGNOSIS — G4733 Obstructive sleep apnea (adult) (pediatric): Secondary | ICD-10-CM | POA: Diagnosis not present

## 2015-09-26 DIAGNOSIS — Z9660 Presence of unspecified orthopedic joint implant: Secondary | ICD-10-CM | POA: Diagnosis not present

## 2015-10-14 ENCOUNTER — Encounter: Payer: Self-pay | Admitting: Cardiology

## 2015-10-14 DIAGNOSIS — E785 Hyperlipidemia, unspecified: Secondary | ICD-10-CM

## 2015-10-14 DIAGNOSIS — G4733 Obstructive sleep apnea (adult) (pediatric): Secondary | ICD-10-CM

## 2015-10-14 HISTORY — DX: Obstructive sleep apnea (adult) (pediatric): G47.33

## 2015-10-14 HISTORY — DX: Hyperlipidemia, unspecified: E78.5

## 2015-10-14 NOTE — Progress Notes (Signed)
Cardiology Office Note   Date:  10/15/2015   ID:  [name], DOB 26-Aug-1934, MRN [id number]  PCP:  [name] Argyle, MD    Chief Complaint  Patient presents with  . Coronary Artery Disease  . Sleep Apnea  . Hypertension      History of Present Illness: [name] is a 81 y.o. male who presents for followup of atrial fibrillation.He has a history of PAF s/p DCCV 10/2014 and is on Eliquis. He has mild LV dysfunction EF 40-45% and dilated aortic root at 45mm with mild AI. He has ASCAD by cath with 30% LM at the ostium, 70-80% ostial D2, 30-40% prox LCX, 30-40% prox and 40% mid RCA and EF 40%. He reverted back to atrial fibrillation. With continued LV dysfunction, rhythm control was felt to be warranted. He was placed on amiodarone. He continues to ride his bike a little - he says due to "laziness".  He is planning to ride his bike outdoors now that the weather is better. He is NYHA 2-2b. He denies orthopnea, PND. He denies any chest pain, SOB, DOE (except with going up stairs), palpitations, dizziness or syncope. He denies LE edema.  He has mild OSA with an AHI of 10.6/hr but 38/hr during REM sleep and is on CPAP at 9cm H2O. He tolerates the CPAP well.  He feels rested in the am and has no daytime sleepiness. He sleeps 6-8 hours nightly.   He tolerates the mask and feels the pressure is adequate.      Past Medical History  Diagnosis Date  . Hyperlipidemia   . DJD (degenerative joint disease)     feet, knees and back  . GERD (gastroesophageal reflux disease)     hiatal hernia  . Obesity   . Renal cyst, left 2010    5 cm seen on ultrasound   . Lesion of left femoral nerve     3 cm MRI 04/2010 consistent with hemangioma  . PSA (psoriatic arthritis) ([facility])     Mild increase 3.65 on 05/2011 stable 3.68 on 09/2011  . Back pain   . Mild aortic insufficiency   . Persistent atrial fibrillation ([place]) 09/17/2014  . Dilated aortic root ([facility]) 09/17/2014  . DCM  (dilated cardiomyopathy) ([place]) 09/17/2014    a.  EF 40%; b. Echo 5/16:  EF 40-45%, diff HK, inf AK, mild AI, MAC, severe LAE  . CAD (coronary artery disease), native coronary artery 09/20/2014    30% LM at the ostium, 70-80% ostial D2, 30-40% prox LCX, 30-40% prox and 40% mid RCA and EF 40%.    . Benign essential HTN 09/17/2014  . Hyperlipidemia LDL goal <70 10/14/2015  . OSA (obstructive sleep apnea) 10/14/2015    Mild with AHI 10.6/hr overall and 38/hr during REM sleep.  On CPAP at 9cm H2O    Past Surgical History  Procedure Laterality Date  . Left heart catheterization with coronary angiogram N/A 10/01/2014    Procedure: LEFT HEART CATHETERIZATION WITH CORONARY ANGIOGRAM;  Surgeon: Troy Sine, MD;  Location: [facility] CATH LAB;  Service: Cardiovascular;  Laterality: N/A;  . Cardioversion N/A 11/04/2014    Procedure: CARDIOVERSION;  Surgeon: Sueanne Margarita, MD;  Location: [facility];  Service: Cardiovascular;  Laterality: N/A;  . Cardioversion N/A 01/09/2015    Procedure: CARDIOVERSION;  Surgeon: Pixie Casino, MD;  Location: [facility];  Service: Cardiovascular;  Laterality: N/A;  Current Outpatient Prescriptions  Medication Sig Dispense Refill  . acetaminophen (TYLENOL) 650 MG CR tablet Take 650 mg by mouth every 8 (eight) hours as needed for pain.    Marland Kitchen amiodarone (PACERONE) 200 MG tablet Take 1 tablet (200 mg total) by mouth daily. 90 tablet 3  . apixaban (ELIQUIS) 5 MG TABS tablet Take 1 tablet (5 mg total) by mouth 2 (two) times daily. 180 tablet 3  . Calcium Carb-Cholecalciferol 500-400 MG-UNIT TABS Take 1 tablet by mouth daily.    . furosemide (LASIX) 40 MG tablet Take 1 tablet (40 mg total) by mouth 2 (two) times daily. 180 tablet 3  . loratadine (CLARITIN) 10 MG tablet Take 10 mg by mouth daily as needed for allergies.    Marland Kitchen losartan (COZAAR) 25 MG tablet Take 1 tablet (25 mg total) by mouth daily. 90 tablet 3  . meloxicam (MOBIC) 15 MG tablet Take 15 mg by mouth daily.    .  metoprolol succinate (TOPROL XL) 25 MG 24 hr tablet Take 1 tablet (25 mg total) by mouth 2 (two) times daily. 180 tablet 3  . MULTIPLE VITAMINS PO Take 1 tablet by mouth daily.    . polyethylene glycol (MIRALAX / GLYCOLAX) packet Take 17 g by mouth daily as needed (constipation).     . rosuvastatin (CRESTOR) 20 MG tablet Take 1 tablet (20 mg total) by mouth daily. 90 tablet 3   No current facility-administered medications for this visit.    Allergies:   Simvastatin; Codeine phosphate; and Penicillins    Social History:  The patient  reports that he has quit smoking. He does not have any smokeless tobacco history on file. He reports that he does not drink alcohol or use illicit drugs.   Family History:  The patient's family history includes Atrial fibrillation in his sister; Cancer in his mother; Diabetes in his father; Heart attack in his maternal grandmother, paternal grandfather, and paternal grandmother; Heart disease in his sister; Stroke in his maternal grandfather.    ROS:  Please see the history of present illness.   Otherwise, review of systems are positive for none.   All other systems are reviewed and negative.    PHYSICAL EXAM: VS:  BP 152/72 mmHg  Pulse 56  Ht 6' (1.829 m)  Wt 264 lb (119.75 kg)  BMI 35.80 kg/m2 , BMI Body mass index is 35.8 kg/(m^2). GEN: Well nourished, well developed, in no acute distress HEENT: normal Neck: no JVD, carotid bruits, or masses Cardiac: RRR; no murmurs, rubs, or gallops,no edema  Respiratory:  clear to auscultation bilaterally, normal work of breathing GI: soft, nontender, nondistended, + BS MS: no deformity or atrophy Skin: warm and dry, no rash Neuro:  Strength and sensation are intact Psych: euthymic mood, full affect   EKG:  EKG is not ordered today.    Recent Labs: 10/31/2014: Hemoglobin 16.7; Platelets 199.0 12/26/2014: Pro B Natriuretic peptide (BNP) 211.0* 04/10/2015: TSH 1.06 07/29/2015: ALT 29 09/19/2015: BUN 40*; Creat  1.17; Potassium 4.3; Sodium 139    Lipid Panel    Component Value Date/Time   CHOL 160 07/29/2015 1140   TRIG 122 07/29/2015 1140   HDL 47 07/29/2015 1140   CHOLHDL 3.4 07/29/2015 1140   VLDL 24 07/29/2015 1140   LDLCALC 89 07/29/2015 1140      Wt Readings from Last 3 Encounters:  10/15/15 264 lb (119.75 kg)  06/30/15 250 lb (113.399 kg)  04/10/15 260 lb (117.935 kg)     ASSESSMENT AND  PLAN:  1. Chronic Combined Systolic and Diastolic CHF: Volume remains slightly increased with LE edema.Continue current therapy with Lasix/BB/ARB. 2. Paroxysmal Atrial Fibrillation:maintaining NSR.Continue Amio and Eliquis.  Check CBC. 3. Non-Ischemic CM: Probably related to AFib (?tachycardia mediated). Continue ARB, beta blocker. EF 40% 4. CAD: Non-obstructive by  LHC. He is not on ASA as he is on Eliquis. Continue statin and beta blocker 5. HTN: Borderline controlled on current regimen.  6. Hyperlipidemia:LDL 103 in December and goal < 70.   Changed to crestor.  FLP and ALT pending next month.    7. Dilated Ascending Aorta/Aortic Root: Repeat CT due in 09/2016.  8. Mild OSA - Tolerating CPAP well.  Patient has been using and benefiting from CPAP use and will continue to benefit from therapy.  His d/l today showed an AHI of 3.3/hr on 9cm H2O with 100% compliance in using more than 4 hours nightly.     Current medicines are reviewed at length with the patient today.  The patient does not have concerns regarding medicines.  The following changes have been made:  no change  Labs/ tests ordered today: See above Assessment and Plan No orders of the defined types were placed in this encounter.     Disposition:   FU with me in 6 months  Signed, Sueanne Margarita, MD  10/15/2015 10:20 AM    [facility] [place], [place],   [address] Phone: [phone]; Fax: [phone]

## 2015-10-15 ENCOUNTER — Ambulatory Visit (INDEPENDENT_AMBULATORY_CARE_PROVIDER_SITE_OTHER): Payer: PPO | Admitting: Cardiology

## 2015-10-15 ENCOUNTER — Encounter: Payer: Self-pay | Admitting: Cardiology

## 2015-10-15 VITALS — BP 134/75 | HR 56 | Ht 72.0 in | Wt 264.0 lb

## 2015-10-15 DIAGNOSIS — I5042 Chronic combined systolic (congestive) and diastolic (congestive) heart failure: Secondary | ICD-10-CM

## 2015-10-15 DIAGNOSIS — G4733 Obstructive sleep apnea (adult) (pediatric): Secondary | ICD-10-CM

## 2015-10-15 DIAGNOSIS — I2510 Atherosclerotic heart disease of native coronary artery without angina pectoris: Secondary | ICD-10-CM

## 2015-10-15 DIAGNOSIS — I77810 Thoracic aortic ectasia: Secondary | ICD-10-CM

## 2015-10-15 DIAGNOSIS — I10 Essential (primary) hypertension: Secondary | ICD-10-CM | POA: Diagnosis not present

## 2015-10-15 DIAGNOSIS — I420 Dilated cardiomyopathy: Secondary | ICD-10-CM | POA: Diagnosis not present

## 2015-10-15 DIAGNOSIS — E785 Hyperlipidemia, unspecified: Secondary | ICD-10-CM

## 2015-10-15 LAB — BASIC METABOLIC PANEL
BUN: 29 mg/dL — AB (ref 7–25)
CALCIUM: 9.4 mg/dL (ref 8.6–10.3)
CO2: 30 mmol/L (ref 20–31)
CREATININE: 1.25 mg/dL — AB (ref 0.70–1.18)
Chloride: 97 mmol/L — ABNORMAL LOW (ref 98–110)
GLUCOSE: 95 mg/dL (ref 65–99)
Potassium: 4.8 mmol/L (ref 3.5–5.3)
Sodium: 139 mmol/L (ref 135–146)

## 2015-10-15 LAB — LIPID PANEL
CHOL/HDL RATIO: 3.5 ratio (ref <=5.0)
CHOLESTEROL: 156 mg/dL (ref 125–200)
HDL: 45 mg/dL (ref >=40)
LDL Cholesterol: 73 mg/dL (ref <130)
Triglycerides: 190 mg/dL — ABNORMAL HIGH (ref <150)
VLDL: 38 mg/dL — ABNORMAL HIGH (ref <30)

## 2015-10-15 LAB — HEPATIC FUNCTION PANEL
ALBUMIN: 4.2 g/dL (ref 3.6–5.1)
ALK PHOS: 53 U/L (ref 40–115)
ALT: 31 U/L (ref 9–46)
AST: 30 U/L (ref 10–35)
BILIRUBIN TOTAL: 0.6 mg/dL (ref 0.2–1.2)
Bilirubin, Direct: 0.1 mg/dL (ref <=0.2)
Indirect Bilirubin: 0.5 mg/dL (ref 0.2–1.2)
TOTAL PROTEIN: 7.5 g/dL (ref 6.1–8.1)

## 2015-10-15 NOTE — Patient Instructions (Signed)
Medication Instructions:  Your physician recommends that you continue on your current medications as directed. Please refer to the Current Medication list given to you today.   Labwork: TODAY: BMET, LFTs, Lipids  Testing/Procedures: None  Follow-Up: Your physician wants you to follow-up in: 6 months with Dr. Radford Pax. You will receive a reminder letter in the mail two months in advance. If you don't receive a letter, please call our office to schedule the follow-up appointment.   Any Other Special Instructions Will Be Listed Below (If Applicable). Check BLOOD PRESSURE daily for a week and call with results.    If you need a refill on your cardiac medications before your next appointment, please call your pharmacy.

## 2015-10-20 ENCOUNTER — Encounter: Payer: Self-pay | Admitting: Cardiology

## 2015-10-24 DIAGNOSIS — G4733 Obstructive sleep apnea (adult) (pediatric): Secondary | ICD-10-CM | POA: Diagnosis not present

## 2015-10-24 DIAGNOSIS — Z9660 Presence of unspecified orthopedic joint implant: Secondary | ICD-10-CM | POA: Diagnosis not present

## 2015-11-10 DIAGNOSIS — I480 Paroxysmal atrial fibrillation: Secondary | ICD-10-CM | POA: Diagnosis not present

## 2015-11-10 DIAGNOSIS — I10 Essential (primary) hypertension: Secondary | ICD-10-CM | POA: Diagnosis not present

## 2015-11-10 DIAGNOSIS — Z6837 Body mass index (BMI) 37.0-37.9, adult: Secondary | ICD-10-CM | POA: Diagnosis not present

## 2015-11-10 DIAGNOSIS — E669 Obesity, unspecified: Secondary | ICD-10-CM | POA: Diagnosis not present

## 2015-11-10 DIAGNOSIS — I509 Heart failure, unspecified: Secondary | ICD-10-CM | POA: Diagnosis not present

## 2015-11-22 ENCOUNTER — Other Ambulatory Visit: Payer: Self-pay | Admitting: Physician Assistant

## 2015-11-24 DIAGNOSIS — Z9660 Presence of unspecified orthopedic joint implant: Secondary | ICD-10-CM | POA: Diagnosis not present

## 2015-11-24 DIAGNOSIS — G4733 Obstructive sleep apnea (adult) (pediatric): Secondary | ICD-10-CM | POA: Diagnosis not present

## 2015-12-24 DIAGNOSIS — G4733 Obstructive sleep apnea (adult) (pediatric): Secondary | ICD-10-CM | POA: Diagnosis not present

## 2015-12-24 DIAGNOSIS — Z9660 Presence of unspecified orthopedic joint implant: Secondary | ICD-10-CM | POA: Diagnosis not present

## 2016-01-01 DIAGNOSIS — I10 Essential (primary) hypertension: Secondary | ICD-10-CM | POA: Diagnosis not present

## 2016-01-01 DIAGNOSIS — M545 Low back pain: Secondary | ICD-10-CM | POA: Diagnosis not present

## 2016-01-21 ENCOUNTER — Telehealth: Payer: Self-pay | Admitting: Cardiology

## 2016-01-21 NOTE — Telephone Encounter (Signed)
New message ° ° ° ° ° °Talk to the nurse.  Pt would not tell me what he wanted °

## 2016-01-21 NOTE — Telephone Encounter (Signed)
Patient st he's experienced progressively worsening blurry vision for the last few months.  His insurance sent him paperwork on his medications where he read a side effect of amiodarone is blurry vision. He has no other complaints. He reports his BP has been normal.  He has been taking medications as instructed. He requests Dr. Radford Pax to review medications and give new recommendations.

## 2016-01-22 NOTE — Telephone Encounter (Signed)
Instructed patient to have exam by eye doctor before any medication changes will be made. He will call [facility] after exam for update. Patient was grateful for call.

## 2016-01-22 NOTE — Telephone Encounter (Signed)
Patient needs an exam with his opthalmologist to see if he is having any effects of Amio

## 2016-01-24 DIAGNOSIS — G4733 Obstructive sleep apnea (adult) (pediatric): Secondary | ICD-10-CM | POA: Diagnosis not present

## 2016-01-24 DIAGNOSIS — Z9660 Presence of unspecified orthopedic joint implant: Secondary | ICD-10-CM | POA: Diagnosis not present

## 2016-02-10 DIAGNOSIS — Z961 Presence of intraocular lens: Secondary | ICD-10-CM | POA: Diagnosis not present

## 2016-02-10 DIAGNOSIS — H40003 Preglaucoma, unspecified, bilateral: Secondary | ICD-10-CM | POA: Diagnosis not present

## 2016-02-10 DIAGNOSIS — H18413 Arcus senilis, bilateral: Secondary | ICD-10-CM | POA: Diagnosis not present

## 2016-02-23 DIAGNOSIS — Z9660 Presence of unspecified orthopedic joint implant: Secondary | ICD-10-CM | POA: Diagnosis not present

## 2016-02-23 DIAGNOSIS — G4733 Obstructive sleep apnea (adult) (pediatric): Secondary | ICD-10-CM | POA: Diagnosis not present

## 2016-03-04 DIAGNOSIS — Z9660 Presence of unspecified orthopedic joint implant: Secondary | ICD-10-CM | POA: Diagnosis not present

## 2016-03-04 DIAGNOSIS — G4733 Obstructive sleep apnea (adult) (pediatric): Secondary | ICD-10-CM | POA: Diagnosis not present

## 2016-03-25 DIAGNOSIS — G4733 Obstructive sleep apnea (adult) (pediatric): Secondary | ICD-10-CM | POA: Diagnosis not present

## 2016-03-25 DIAGNOSIS — Z9660 Presence of unspecified orthopedic joint implant: Secondary | ICD-10-CM | POA: Diagnosis not present

## 2016-04-02 ENCOUNTER — Other Ambulatory Visit: Payer: Self-pay | Admitting: Cardiology

## 2016-04-25 DIAGNOSIS — G4733 Obstructive sleep apnea (adult) (pediatric): Secondary | ICD-10-CM | POA: Diagnosis not present

## 2016-04-25 DIAGNOSIS — Z9660 Presence of unspecified orthopedic joint implant: Secondary | ICD-10-CM | POA: Diagnosis not present

## 2016-05-10 ENCOUNTER — Ambulatory Visit (INDEPENDENT_AMBULATORY_CARE_PROVIDER_SITE_OTHER): Payer: PPO | Admitting: Cardiology

## 2016-05-10 ENCOUNTER — Encounter: Payer: Self-pay | Admitting: Cardiology

## 2016-05-10 VITALS — BP 138/72 | HR 50 | Ht 72.0 in | Wt 251.1 lb

## 2016-05-10 DIAGNOSIS — I10 Essential (primary) hypertension: Secondary | ICD-10-CM

## 2016-05-10 DIAGNOSIS — I77810 Thoracic aortic ectasia: Secondary | ICD-10-CM

## 2016-05-10 DIAGNOSIS — I480 Paroxysmal atrial fibrillation: Secondary | ICD-10-CM | POA: Diagnosis not present

## 2016-05-10 DIAGNOSIS — I420 Dilated cardiomyopathy: Secondary | ICD-10-CM

## 2016-05-10 DIAGNOSIS — G4733 Obstructive sleep apnea (adult) (pediatric): Secondary | ICD-10-CM

## 2016-05-10 DIAGNOSIS — E785 Hyperlipidemia, unspecified: Secondary | ICD-10-CM | POA: Diagnosis not present

## 2016-05-10 DIAGNOSIS — I481 Persistent atrial fibrillation: Secondary | ICD-10-CM

## 2016-05-10 DIAGNOSIS — I4819 Other persistent atrial fibrillation: Secondary | ICD-10-CM

## 2016-05-10 DIAGNOSIS — I5042 Chronic combined systolic (congestive) and diastolic (congestive) heart failure: Secondary | ICD-10-CM

## 2016-05-10 DIAGNOSIS — I2510 Atherosclerotic heart disease of native coronary artery without angina pectoris: Secondary | ICD-10-CM

## 2016-05-10 LAB — LIPID PANEL
CHOL/HDL RATIO: 2.5 ratio (ref <=5.0)
Cholesterol: 129 mg/dL (ref 125–200)
HDL: 51 mg/dL (ref >=40)
LDL CALC: 59 mg/dL (ref <130)
TRIGLYCERIDES: 93 mg/dL (ref <150)
VLDL: 19 mg/dL (ref <30)

## 2016-05-10 LAB — CBC WITH DIFFERENTIAL/PLATELET
BASOS PCT: 1 %
Basophils Absolute: 70 cells/uL (ref 0–200)
EOS ABS: 280 {cells}/uL (ref 15–500)
Eosinophils Relative: 4 %
HEMATOCRIT: 44.8 % (ref 38.5–50.0)
Hemoglobin: 15 g/dL (ref 13.2–17.1)
LYMPHS PCT: 26 %
Lymphs Abs: 1820 cells/uL (ref 850–3900)
MCH: 28.6 pg (ref 27.0–33.0)
MCHC: 33.5 g/dL (ref 32.0–36.0)
MCV: 85.3 fL (ref 80.0–100.0)
MONO ABS: 1050 {cells}/uL — AB (ref 200–950)
MONOS PCT: 15 %
MPV: 10.3 fL (ref 7.5–12.5)
NEUTROS ABS: 3780 {cells}/uL (ref 1500–7800)
Neutrophils Relative %: 54 %
PLATELETS: 186 10*3/uL (ref 140–400)
RBC: 5.25 MIL/uL (ref 4.20–5.80)
RDW: 14.1 % (ref 11.0–15.0)
WBC: 7 10*3/uL (ref 3.8–10.8)

## 2016-05-10 LAB — BASIC METABOLIC PANEL
BUN: 20 mg/dL (ref 7–25)
CHLORIDE: 101 mmol/L (ref 98–110)
CO2: 29 mmol/L (ref 20–31)
Calcium: 9.8 mg/dL (ref 8.6–10.3)
Creat: 1.23 mg/dL — ABNORMAL HIGH (ref 0.70–1.18)
Glucose, Bld: 98 mg/dL (ref 65–99)
POTASSIUM: 4.4 mmol/L (ref 3.5–5.3)
Sodium: 139 mmol/L (ref 135–146)

## 2016-05-10 LAB — HEPATIC FUNCTION PANEL
ALK PHOS: 52 U/L (ref 40–115)
ALT: 28 U/L (ref 9–46)
AST: 30 U/L (ref 10–35)
Albumin: 4 g/dL (ref 3.6–5.1)
BILIRUBIN DIRECT: 0.1 mg/dL (ref <=0.2)
BILIRUBIN INDIRECT: 0.4 mg/dL (ref 0.2–1.2)
Total Bilirubin: 0.5 mg/dL (ref 0.2–1.2)
Total Protein: 7.1 g/dL (ref 6.1–8.1)

## 2016-05-10 MED ORDER — METOPROLOL SUCCINATE ER 25 MG PO TB24: 25.0000 mg | ORAL_TABLET | Freq: Every day | ORAL | 3 refills | Status: DC

## 2016-05-10 NOTE — Progress Notes (Signed)
Cardiology Office Note    Date:  05/10/2016   ID:  [name], DOB [birth year], MRN [id number]  PCP:  Mathews Argyle, MD  Cardiologist:  Fransico Him, MD   Chief Complaint  Patient presents with  . Atrial Fibrillation  . Coronary Artery Disease    History of Present Illness:  [name] is a 81 y.o. male who presents for followup of atrial fibrillation.He has a history of PAF s/p DCCV 10/2014 and is on Eliquis. He has mild LV dysfunction EF 40-45% and dilated aortic root at 33mm with mild AI. He has ASCAD by cath with 30% LM at the ostium, 70-80% ostial D2, 30-40% prox LCX, 30-40% prox and 40% mid RCA and EF 40%. He reverted back to atrial fibrillation. With continued LV dysfunction, rhythm control was felt to be warranted. He was placed on amiodarone. He continues to ride his bike a little.  He is NYHA 2-2b. He denies orthopnea, PND. He denies any chest pain, SOB, DOE (except with going up stairs), palpitations, dizziness or syncope. He denies LE edema.  He has mild OSA with an AHI of 10.6/hr but 38/hr during REM sleep and is on CPAP at 9cm H2O. He tolerates the CPAP well.  He feels rested for the most part in the am if he slept well the night before but wakes up a lot with back pain at night.  He does nap some during the day. He sleeps 6-8 hours nightly.   He tolerates the mask and feels the pressure is adequate.     Past Medical History:  Diagnosis Date  . Back pain   . Benign essential HTN 09/17/2014  . CAD (coronary artery disease), native coronary artery 09/20/2014   30% LM at the ostium, 70-80% ostial D2, 30-40% prox LCX, 30-40% prox and 40% mid RCA and EF 40%.    . DCM (dilated cardiomyopathy) ([place]) 09/17/2014   a.  EF 40%; b. Echo 5/16:  EF 40-45%, diff HK, inf AK, mild AI, MAC, severe LAE  . Dilated aortic root ([place]) 09/17/2014   normal diameter on echo 2017  . DJD (degenerative joint disease)    feet, knees and back  . GERD (gastroesophageal reflux disease)      hiatal hernia  . Hyperlipidemia LDL goal <70 10/14/2015  . Lesion of left femoral nerve    3 cm MRI 04/2010 consistent with hemangioma  . Mild aortic insufficiency   . Obesity   . OSA (obstructive sleep apnea) 10/14/2015   Mild with AHI 10.6/hr overall and 38/hr during REM sleep.  On CPAP at 9cm H2O  . PAF (paroxysmal atrial fibrillation) ([place]) 09/17/2014  . PSA (psoriatic arthritis) (HCC)    Mild increase 3.65 on 05/2011 stable 3.68 on 09/2011  . Renal cyst, left 2010   5 cm seen on ultrasound     Past Surgical History:  Procedure Laterality Date  . CARDIOVERSION N/A 11/04/2014   Procedure: CARDIOVERSION;  Surgeon: Sueanne Margarita, MD;  Location: [facility] ENDOSCOPY;  Service: Cardiovascular;  Laterality: N/A;  . CARDIOVERSION N/A 01/09/2015   Procedure: CARDIOVERSION;  Surgeon: Pixie Casino, MD;  Location: [facility];  Service: Cardiovascular;  Laterality: N/A;  . LEFT HEART CATHETERIZATION WITH CORONARY ANGIOGRAM N/A 10/01/2014   Procedure: LEFT HEART CATHETERIZATION WITH CORONARY ANGIOGRAM;  Surgeon: Troy Sine, MD;  Location: [facility] CATH LAB;  Service: Cardiovascular;  Laterality: N/A;    Current Medications: Outpatient Medications Prior to Visit  Medication Sig Dispense Refill  .  acetaminophen (TYLENOL) 650 MG CR tablet Take 650 mg by mouth every 8 (eight) hours as needed for pain.    Marland Kitchen amiodarone (PACERONE) 200 MG tablet Take 1 tablet (200 mg total) by mouth daily. 90 tablet 3  . apixaban (ELIQUIS) 5 MG TABS tablet Take 1 tablet (5 mg total) by mouth 2 (two) times daily. 180 tablet 3  . Calcium Carb-Cholecalciferol 500-400 MG-UNIT TABS Take 1 tablet by mouth daily.    . furosemide (LASIX) 40 MG tablet Take 1 tablet (40 mg total) by mouth 2 (two) times daily. (Patient taking differently: Take 40 mg by mouth daily. ) 180 tablet 3  . loratadine (CLARITIN) 10 MG tablet Take 10 mg by mouth daily as needed for allergies.    Marland Kitchen losartan (COZAAR) 25 MG tablet TAKE 1 TABLET (25 MG TOTAL) BY  MOUTH DAILY. 90 tablet 0  . metoprolol succinate (TOPROL XL) 25 MG 24 hr tablet Take 1 tablet (25 mg total) by mouth 2 (two) times daily. 180 tablet 3  . MULTIPLE VITAMINS PO Take 1 tablet by mouth daily.    . polyethylene glycol (MIRALAX / GLYCOLAX) packet Take 17 g by mouth daily as needed (constipation).     . rosuvastatin (CRESTOR) 20 MG tablet Take 1 tablet (20 mg total) by mouth daily. 90 tablet 3  . meloxicam (MOBIC) 15 MG tablet Take 15 mg by mouth daily.    . metoprolol succinate (TOPROL-XL) 25 MG 24 hr tablet TAKE 1 TABLET (25 MG TOTAL) BY MOUTH 2 (TWO) TIMES DAILY. (Patient not taking: Reported on 05/10/2016) 60 tablet 10   No facility-administered medications prior to visit.      Allergies:   Simvastatin; Codeine phosphate; and Penicillins   Social History   Social History  . Marital status: Married    Spouse name: N/A  . Number of children: 4  . 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 Smoking status: Former Research scientist (life sciences)  . Smokeless tobacco: Never Used  . Alcohol use No  . Drug use: No  . Sexual activity: Yes   Other Topics Concern  . None   Social History Narrative  . None     Family History:  The patient's family history includes Atrial fibrillation in his sister; Cancer in his mother; Diabetes in his father; Heart attack in his maternal grandmother, paternal grandfather, and paternal grandmother; Heart disease in his sister; Stroke in his maternal grandfather.   ROS:   Please see the history of present illness.    ROS All other systems reviewed and are negative.  No flowsheet data found.     PHYSICAL EXAM:   VS:  BP 138/72   Pulse (!) 50   Ht 6' (1.829 m)   Wt 251 lb 1.9 oz (113.9 kg)   BMI 34.06 kg/m    GEN: Well nourished, well developed, in no acute distress  HEENT: normal  Neck: no JVD, carotid bruits, or masses Cardiac: RRR; no murmurs, rubs, or gallops,no edema.  Intact distal pulses  bilaterally.  Respiratory:  clear to auscultation bilaterally, normal work of breathing GI: soft, nontender, nondistended, + BS MS: no deformity or atrophy  Skin: warm and dry, no rash Neuro:  Alert and Oriented x 3, Strength and sensation are intact Psych: euthymic mood, full affect  Wt Readings from Last 3 Encounters:  05/10/16 251 lb 1.9 oz (113.9 kg)  10/15/15 264 lb (119.7 kg)  06/30/15  250 lb (113.4 kg)      Studies/Labs Reviewed:   EKG:  EKG is ordered today.  The ekg ordered today demonstrates sinus bradycardia at 50bpm with no ST changes  Recent Labs: 10/15/2015: ALT 31; BUN 29; Creat 1.25; Potassium 4.8; Sodium 139   Lipid Panel    Component Value Date/Time   CHOL 156 10/15/2015 1115   TRIG 190 (H) 10/15/2015 1115   HDL 45 10/15/2015 1115   CHOLHDL 3.5 10/15/2015 1115   VLDL 38 (H) 10/15/2015 1115   LDLCALC 73 10/15/2015 1115    Additional studies/ records that were reviewed today include:  none    ASSESSMENT:    1. Paroxysmal atrial fibrillation (HCC)   2. Dilated aortic root ([place])   3. DCM (dilated cardiomyopathy) ([place])   4. Chronic combined systolic and diastolic CHF (congestive heart failure) ([place])   5. Coronary artery disease involving native coronary artery of native heart without angina pectoris   6. Benign essential HTN   7. OSA (obstructive sleep apnea)   8. Hyperlipidemia LDL goal <70      PLAN:  In order of problems listed above:  1.  PAF maintaining sinus bradycardia.  Continue Amio/BB and DOAC.  Check BMET and CBC.  Check TSH, LFTs, PFTs with DLCO.  I reminded him to get a yearly eye exam.  Will decrease Toprol to 25mg  daily due to bradycardia.   2.  Dilated aortic root - normal on last echo 12/2014. 3.  DCM - EF 40-45% by echo. 4.  Chronic combined systolic/diastolic CHF - he appears euvolemic on exam.  He has lost 13lbs since March.  Continue diuretic/ARB and BB. 5.  ASCAD - cath with 30% LM at the ostium, 70-80% ostial D2, 30-40% prox  LCX, 30-40% prox and 40% mid RCA and EF 40%.  He has not had any angina. He is not on ASA due to DOAC.  Continue BB and statin.  6.  HTN - BP controlled on current meds. Continue BB/ARB. 7.  OSA - the patient is tolerating PAP therapy well without any problems. The PAP download was reviewed today and showed an AHI of 3/hr on 9 cm H2O with 93% compliance in using more than 4 hours nightly.  The patient has been using and benefiting from CPAP use and will continue to benefit from therapy.  8.  Hyperlipidemia with LDL goal < 70.  Continue statin.  Check FLp and ALT.     Medication Adjustments/Labs and Tests Ordered: Current medicines are reviewed at length with the patient today.  Concerns regarding medicines are outlined above.  Medication changes, Labs and Tests ordered today are listed in the Patient Instructions below.  There are no Patient Instructions on file for this visit.   Signed, Fransico Him, MD  05/10/2016 11:10 AM    [address], [place], [place]  [address] Phone: [phone]; Fax: [phone]

## 2016-05-10 NOTE — Patient Instructions (Signed)
Medication Instructions:  1) DECREASE METOPROLOL to 25 mg daily  Labwork: TODAY: BMET, CBC, TSH, LFTs, Lipids  Testing/Procedures: Your physician has recommended that you have a pulmonary function test. Pulmonary Function Tests are a group of tests that measure how well air moves in and out of your lungs.  Follow-Up: Your physician wants you to follow-up in: 6 months with Dr. Radford Pax. You will receive a reminder letter in the mail two months in advance. If you don't receive a letter, please call our office to schedule the follow-up appointment.   Any Other Special Instructions Will Be Listed Below (If Applicable).     If you need a refill on your cardiac medications before your next appointment, please call your pharmacy.

## 2016-05-11 LAB — TSH: TSH: 0.84 m[IU]/L (ref 0.40–4.50)

## 2016-05-17 ENCOUNTER — Other Ambulatory Visit: Payer: Self-pay | Admitting: Cardiology

## 2016-05-25 DIAGNOSIS — G4733 Obstructive sleep apnea (adult) (pediatric): Secondary | ICD-10-CM | POA: Diagnosis not present

## 2016-05-25 DIAGNOSIS — Z9660 Presence of unspecified orthopedic joint implant: Secondary | ICD-10-CM | POA: Diagnosis not present

## 2016-06-10 DIAGNOSIS — Z23 Encounter for immunization: Secondary | ICD-10-CM | POA: Diagnosis not present

## 2016-06-15 DIAGNOSIS — H5203 Hypermetropia, bilateral: Secondary | ICD-10-CM | POA: Diagnosis not present

## 2016-06-24 ENCOUNTER — Ambulatory Visit (INDEPENDENT_AMBULATORY_CARE_PROVIDER_SITE_OTHER): Payer: PPO | Admitting: Internal Medicine

## 2016-06-24 DIAGNOSIS — I480 Paroxysmal atrial fibrillation: Secondary | ICD-10-CM

## 2016-06-24 LAB — PULMONARY FUNCTION TEST
DL/VA % pred: 104 %
DL/VA: 4.81 ml/min/mmHg/L
DLCO COR: 30.94 ml/min/mmHg
DLCO UNC % PRED: 92 %
DLCO UNC: 30.4 ml/min/mmHg
DLCO cor % pred: 93 %
FEF 25-75 PRE: 3.45 L/s
FEF 25-75 Post: 3.82 L/sec
FEF2575-%CHANGE-POST: 10 %
FEF2575-%PRED-PRE: 166 %
FEF2575-%Pred-Post: 184 %
FEV1-%Change-Post: 2 %
FEV1-%PRED-PRE: 101 %
FEV1-%Pred-Post: 104 %
FEV1-POST: 3.1 L
FEV1-PRE: 3.03 L
FEV1FVC-%Change-Post: 2 %
FEV1FVC-%Pred-Pre: 114 %
FEV6-%CHANGE-POST: 0 %
FEV6-%PRED-POST: 94 %
FEV6-%Pred-Pre: 95 %
FEV6-POST: 3.68 L
FEV6-PRE: 3.69 L
FEV6FVC-%PRED-POST: 107 %
FEV6FVC-%PRED-PRE: 107 %
FVC-%Change-Post: 0 %
FVC-%Pred-Post: 88 %
FVC-%Pred-Pre: 88 %
FVC-Post: 3.68 L
FVC-Pre: 3.69 L
POST FEV1/FVC RATIO: 84 %
POST FEV6/FVC RATIO: 100 %
Pre FEV1/FVC ratio: 82 %
Pre FEV6/FVC Ratio: 100 %
RV % pred: 88 %
RV: 2.37 L
TLC % PRED: 89 %
TLC: 6.38 L

## 2016-06-25 DIAGNOSIS — G4733 Obstructive sleep apnea (adult) (pediatric): Secondary | ICD-10-CM | POA: Diagnosis not present

## 2016-06-25 DIAGNOSIS — Z9660 Presence of unspecified orthopedic joint implant: Secondary | ICD-10-CM | POA: Diagnosis not present

## 2016-06-28 ENCOUNTER — Other Ambulatory Visit: Payer: Self-pay | Admitting: Cardiology

## 2016-07-05 ENCOUNTER — Other Ambulatory Visit: Payer: Self-pay | Admitting: Cardiology

## 2016-07-05 DIAGNOSIS — I5023 Acute on chronic systolic (congestive) heart failure: Secondary | ICD-10-CM

## 2016-07-05 DIAGNOSIS — I5042 Chronic combined systolic (congestive) and diastolic (congestive) heart failure: Secondary | ICD-10-CM

## 2016-07-16 ENCOUNTER — Other Ambulatory Visit: Payer: Self-pay | Admitting: Cardiology

## 2016-07-25 DIAGNOSIS — Z9660 Presence of unspecified orthopedic joint implant: Secondary | ICD-10-CM | POA: Diagnosis not present

## 2016-07-25 DIAGNOSIS — G4733 Obstructive sleep apnea (adult) (pediatric): Secondary | ICD-10-CM | POA: Diagnosis not present

## 2016-08-02 DIAGNOSIS — M48062 Spinal stenosis, lumbar region with neurogenic claudication: Secondary | ICD-10-CM | POA: Diagnosis not present

## 2016-08-02 DIAGNOSIS — Z6836 Body mass index (BMI) 36.0-36.9, adult: Secondary | ICD-10-CM | POA: Diagnosis not present

## 2016-08-02 DIAGNOSIS — M4126 Other idiopathic scoliosis, lumbar region: Secondary | ICD-10-CM | POA: Diagnosis not present

## 2016-08-02 DIAGNOSIS — M545 Low back pain: Secondary | ICD-10-CM | POA: Diagnosis not present

## 2016-08-02 DIAGNOSIS — I10 Essential (primary) hypertension: Secondary | ICD-10-CM | POA: Diagnosis not present

## 2016-08-05 ENCOUNTER — Other Ambulatory Visit: Payer: Self-pay | Admitting: Neurosurgery

## 2016-08-05 DIAGNOSIS — M48062 Spinal stenosis, lumbar region with neurogenic claudication: Secondary | ICD-10-CM

## 2016-08-17 ENCOUNTER — Ambulatory Visit
Admission: RE | Admit: 2016-08-17 | Discharge: 2016-08-17 | Disposition: A | Discharge status: Home / Self Care | Payer: PPO | Source: Ambulatory Visit | Attending: Neurosurgery | Admitting: Neurosurgery

## 2016-08-17 DIAGNOSIS — M48061 Spinal stenosis, lumbar region without neurogenic claudication: Secondary | ICD-10-CM | POA: Diagnosis not present

## 2016-08-17 DIAGNOSIS — M48062 Spinal stenosis, lumbar region with neurogenic claudication: Secondary | ICD-10-CM

## 2016-08-17 MED ORDER — GADOBENATE DIMEGLUMINE 529 MG/ML IV SOLN
20.0000 mL | Freq: Once | INTRAVENOUS | Status: AC | PRN
  Administered 2016-08-17: 20 mL via INTRAVENOUS

## 2016-08-25 DIAGNOSIS — Z9660 Presence of unspecified orthopedic joint implant: Secondary | ICD-10-CM | POA: Diagnosis not present

## 2016-08-25 DIAGNOSIS — M48062 Spinal stenosis, lumbar region with neurogenic claudication: Secondary | ICD-10-CM | POA: Diagnosis not present

## 2016-08-25 DIAGNOSIS — G8929 Other chronic pain: Secondary | ICD-10-CM | POA: Diagnosis not present

## 2016-08-25 DIAGNOSIS — M4126 Other idiopathic scoliosis, lumbar region: Secondary | ICD-10-CM | POA: Diagnosis not present

## 2016-08-25 DIAGNOSIS — M545 Low back pain: Secondary | ICD-10-CM | POA: Diagnosis not present

## 2016-08-25 DIAGNOSIS — G4733 Obstructive sleep apnea (adult) (pediatric): Secondary | ICD-10-CM | POA: Diagnosis not present

## 2016-09-09 DIAGNOSIS — Z6836 Body mass index (BMI) 36.0-36.9, adult: Secondary | ICD-10-CM | POA: Diagnosis not present

## 2016-09-09 DIAGNOSIS — M549 Dorsalgia, unspecified: Secondary | ICD-10-CM | POA: Diagnosis not present

## 2016-09-09 DIAGNOSIS — I10 Essential (primary) hypertension: Secondary | ICD-10-CM | POA: Diagnosis not present

## 2016-09-09 DIAGNOSIS — I480 Paroxysmal atrial fibrillation: Secondary | ICD-10-CM | POA: Diagnosis not present

## 2016-09-09 DIAGNOSIS — E7800 Pure hypercholesterolemia, unspecified: Secondary | ICD-10-CM | POA: Diagnosis not present

## 2016-09-09 DIAGNOSIS — E669 Obesity, unspecified: Secondary | ICD-10-CM | POA: Diagnosis not present

## 2016-09-09 DIAGNOSIS — Z79899 Other long term (current) drug therapy: Secondary | ICD-10-CM | POA: Diagnosis not present

## 2016-09-09 DIAGNOSIS — H6122 Impacted cerumen, left ear: Secondary | ICD-10-CM | POA: Diagnosis not present

## 2016-09-09 DIAGNOSIS — I509 Heart failure, unspecified: Secondary | ICD-10-CM | POA: Diagnosis not present

## 2016-09-09 DIAGNOSIS — Z0000 Encounter for general adult medical examination without abnormal findings: Secondary | ICD-10-CM | POA: Diagnosis not present

## 2016-09-09 DIAGNOSIS — K219 Gastro-esophageal reflux disease without esophagitis: Secondary | ICD-10-CM | POA: Diagnosis not present

## 2016-09-09 DIAGNOSIS — Z1389 Encounter for screening for other disorder: Secondary | ICD-10-CM | POA: Diagnosis not present

## 2016-09-23 ENCOUNTER — Other Ambulatory Visit: Payer: Self-pay

## 2016-09-23 DIAGNOSIS — I77810 Thoracic aortic ectasia: Secondary | ICD-10-CM

## 2016-09-24 DIAGNOSIS — R202 Paresthesia of skin: Secondary | ICD-10-CM | POA: Diagnosis not present

## 2016-10-05 ENCOUNTER — Encounter (INDEPENDENT_AMBULATORY_CARE_PROVIDER_SITE_OTHER): Payer: Self-pay

## 2016-10-05 ENCOUNTER — Other Ambulatory Visit: Payer: PPO | Admitting: *Deleted

## 2016-10-05 DIAGNOSIS — I77810 Thoracic aortic ectasia: Secondary | ICD-10-CM | POA: Diagnosis not present

## 2016-10-05 LAB — BASIC METABOLIC PANEL
BUN / CREAT RATIO: 23 (ref 10–24)
BUN: 29 mg/dL — ABNORMAL HIGH (ref 8–27)
CO2: 25 mmol/L (ref 18–29)
CREATININE: 1.25 mg/dL (ref 0.76–1.27)
Calcium: 9.5 mg/dL (ref 8.6–10.2)
Chloride: 97 mmol/L (ref 96–106)
GFR calc Af Amer: 63 (ref >59)
GFR, EST NON AFRICAN AMERICAN: 54 — AB (ref >59)
GLUCOSE: 94 mg/dL (ref 65–99)
Potassium: 4.3 mmol/L (ref 3.5–5.2)
SODIUM: 137 mmol/L (ref 134–144)

## 2016-10-07 ENCOUNTER — Ambulatory Visit (INDEPENDENT_AMBULATORY_CARE_PROVIDER_SITE_OTHER)
Admission: RE | Admit: 2016-10-07 | Discharge: 2016-10-07 | Disposition: A | Discharge status: Home / Self Care | Payer: PPO | Source: Ambulatory Visit | Attending: Cardiology | Admitting: Cardiology

## 2016-10-07 DIAGNOSIS — I712 Thoracic aortic aneurysm, without rupture: Secondary | ICD-10-CM

## 2016-10-07 DIAGNOSIS — I7121 Aneurysm of the ascending aorta, without rupture: Secondary | ICD-10-CM

## 2016-10-07 DIAGNOSIS — I714 Abdominal aortic aneurysm, without rupture: Secondary | ICD-10-CM | POA: Diagnosis not present

## 2016-10-07 MED ORDER — IOPAMIDOL (ISOVUE-370) INJECTION 76%
100.0000 mL | Freq: Once | INTRAVENOUS | Status: AC | PRN
  Administered 2016-10-07: 100 mL via INTRAVENOUS

## 2016-10-08 ENCOUNTER — Encounter (HOSPITAL_COMMUNITY): Payer: Self-pay

## 2016-10-08 ENCOUNTER — Emergency Department (HOSPITAL_COMMUNITY)
Admission: EM | Admit: 2016-10-08 | Discharge: 2016-10-08 | Disposition: A | Discharge status: Home / Self Care | Payer: PPO | Attending: Emergency Medicine | Admitting: Emergency Medicine

## 2016-10-08 ENCOUNTER — Telehealth: Payer: Self-pay

## 2016-10-08 ENCOUNTER — Emergency Department (HOSPITAL_COMMUNITY): Payer: PPO

## 2016-10-08 DIAGNOSIS — Y92511 Restaurant or cafe as the place of occurrence of the external cause: Secondary | ICD-10-CM | POA: Diagnosis not present

## 2016-10-08 DIAGNOSIS — Z87891 Personal history of nicotine dependence: Secondary | ICD-10-CM | POA: Insufficient documentation

## 2016-10-08 DIAGNOSIS — I10 Essential (primary) hypertension: Secondary | ICD-10-CM | POA: Diagnosis not present

## 2016-10-08 DIAGNOSIS — Z7901 Long term (current) use of anticoagulants: Secondary | ICD-10-CM | POA: Insufficient documentation

## 2016-10-08 DIAGNOSIS — S0990XA Unspecified injury of head, initial encounter: Secondary | ICD-10-CM | POA: Diagnosis not present

## 2016-10-08 DIAGNOSIS — Y999 Unspecified external cause status: Secondary | ICD-10-CM | POA: Insufficient documentation

## 2016-10-08 DIAGNOSIS — I2510 Atherosclerotic heart disease of native coronary artery without angina pectoris: Secondary | ICD-10-CM | POA: Insufficient documentation

## 2016-10-08 DIAGNOSIS — S022XXA Fracture of nasal bones, initial encounter for closed fracture: Secondary | ICD-10-CM | POA: Insufficient documentation

## 2016-10-08 DIAGNOSIS — I719 Aortic aneurysm of unspecified site, without rupture: Secondary | ICD-10-CM

## 2016-10-08 DIAGNOSIS — S0121XA Laceration without foreign body of nose, initial encounter: Secondary | ICD-10-CM | POA: Diagnosis not present

## 2016-10-08 DIAGNOSIS — W01198A Fall on same level from slipping, tripping and stumbling with subsequent striking against other object, initial encounter: Secondary | ICD-10-CM | POA: Diagnosis not present

## 2016-10-08 DIAGNOSIS — R51 Headache: Secondary | ICD-10-CM | POA: Insufficient documentation

## 2016-10-08 DIAGNOSIS — Y9389 Activity, other specified: Secondary | ICD-10-CM | POA: Insufficient documentation

## 2016-10-08 DIAGNOSIS — S0010XA Contusion of unspecified eyelid and periocular area, initial encounter: Secondary | ICD-10-CM | POA: Diagnosis not present

## 2016-10-08 DIAGNOSIS — S022XXB Fracture of nasal bones, initial encounter for open fracture: Secondary | ICD-10-CM | POA: Diagnosis not present

## 2016-10-08 DIAGNOSIS — S199XXA Unspecified injury of neck, initial encounter: Secondary | ICD-10-CM | POA: Diagnosis not present

## 2016-10-08 DIAGNOSIS — S0992XA Unspecified injury of nose, initial encounter: Secondary | ICD-10-CM | POA: Diagnosis not present

## 2016-10-08 DIAGNOSIS — Z23 Encounter for immunization: Secondary | ICD-10-CM | POA: Insufficient documentation

## 2016-10-08 DIAGNOSIS — S0081XA Abrasion of other part of head, initial encounter: Secondary | ICD-10-CM | POA: Insufficient documentation

## 2016-10-08 DIAGNOSIS — S0012XA Contusion of left eyelid and periocular area, initial encounter: Secondary | ICD-10-CM | POA: Diagnosis not present

## 2016-10-08 DIAGNOSIS — S0011XA Contusion of right eyelid and periocular area, initial encounter: Secondary | ICD-10-CM | POA: Diagnosis not present

## 2016-10-08 MED ORDER — CEPHALEXIN 500 MG PO CAPS: 500.0000 mg | ORAL_CAPSULE | Freq: Three times a day (TID) | ORAL | 0 refills | Status: AC

## 2016-10-08 MED ORDER — TETANUS-DIPHTH-ACELL PERTUSSIS 5-2.5-18.5 LF-MCG/0.5 IM SUSP
0.5000 mL | Freq: Once | INTRAMUSCULAR | Status: AC
  Administered 2016-10-08: 0.5 mL via INTRAMUSCULAR
  Filled 2016-10-08: qty 0.5

## 2016-10-08 NOTE — ED Notes (Signed)
Pt to CT

## 2016-10-08 NOTE — Telephone Encounter (Signed)
-----   Message from Sueanne Margarita, MD sent at 10/07/2016  1:35 PM EST ----- Stable aortic aneurysm (4.5cm at sinuses of Valsalva, 4cm at sinotubular junction and 4.1cm in ascending aorta).  Please repeat chest CT angion in 1 year

## 2016-10-08 NOTE — ED Triage Notes (Signed)
Pt reports he tripped over a curb outside and fell forward onto concrete onto his face. He has laceration to the bride of his nose, has hematoma and abrasion to forehead, and also has periorbital bruising but states the bruising around his eyes are from a trip and fall last week. He takes Elaquis due to A-fib. Denies LOC. A&OX4.

## 2016-10-08 NOTE — Telephone Encounter (Signed)
Informed patient of results and verbal understanding expressed.  Repeat CT ordered to be scheduled in 1 year. Patient agrees with treatment plan.  

## 2016-10-08 NOTE — ED Provider Notes (Signed)
[facility] DEPT Provider Note   CSN: [id number] Arrival date & time: 10/08/16  1328     History   Chief Complaint Chief Complaint  Patient presents with  . Fall  . Head Injury    HPI [name] is a 82 y.o. male. With history of paroxysmal atrial fibrillation on eliquis that presents to the ED following a mechanical fall.  Patient reports he was outside, climbing a step to go to a fast food restaurant when he misjudged the step, tripped and fell forward.  He states he hit his face when he fell.  He has mild pain on his forehead and nose.  He reports having some difficulty walking in the past and uses a cane, however, states his gate has been stable for some time and denies weakness or numbness in his legs.  He denies shortness of breath or visual disturbance.  He last took his Eliquis this morning.     He also notes [place] and states that the bruising under his eyes is from that event.  He reports these two falls are the only falls for a long time.   HPI  Past Medical History:  Diagnosis Date  . Back pain   . Benign essential HTN 09/17/2014  . CAD (coronary artery disease), native coronary artery 09/20/2014   30% LM at the ostium, 70-80% ostial D2, 30-40% prox LCX, 30-40% prox and 40% mid RCA and EF 40%.    . DCM (dilated cardiomyopathy) ([place]) 09/17/2014   a.  EF 40%; b. Echo 5/16:  EF 40-45%, diff HK, inf AK, mild AI, MAC, severe LAE  . Dilated aortic root ([place]) 09/17/2014   normal diameter on echo 2017  . DJD (degenerative joint disease)    feet, knees and back  . GERD (gastroesophageal reflux disease)    hiatal hernia  . Hyperlipidemia LDL goal <70 10/14/2015  . Lesion of left femoral nerve    3 cm MRI 04/2010 consistent with hemangioma  . Mild aortic insufficiency   . Obesity   . OSA (obstructive sleep apnea) 10/14/2015   Mild with AHI 10.6/hr overall and 38/hr during REM sleep.  On CPAP at 9cm H2O  . PAF (paroxysmal atrial  fibrillation) ([place]) 09/17/2014  . PSA (psoriatic arthritis) (HCC)    Mild increase 3.65 on 05/2011 stable 3.68 on 09/2011  . Renal cyst, left 2010   5 cm seen on ultrasound     Patient Active Problem List   Diagnosis Date Noted  . Hyperlipidemia LDL goal <70 10/14/2015  . OSA (obstructive sleep apnea) 10/14/2015  . Chronic combined systolic and diastolic CHF (congestive heart failure) ([place]) 01/06/2015  . Chronic anticoagulation-Eliquis 10/11/2014  . Obesity (BMI 30-39.9) 10/11/2014  . Abnormal cardiovascular stress test 09/26/2014  . CAD (coronary artery disease), native coronary artery 09/20/2014  . Paroxysmal atrial fibrillation ([employer]) 09/17/2014  . Benign essential HTN 09/17/2014  . Dilated aortic root ([place]) 09/17/2014  . DCM (dilated cardiomyopathy) ([place]) 09/17/2014  . SOB (shortness of breath) 09/17/2014    Past Surgical History:  Procedure Laterality Date  . CARDIOVERSION N/A 11/04/2014   Procedure: CARDIOVERSION;  Surgeon: Sueanne Margarita, MD;  Location: [facility] ENDOSCOPY;  Service: Cardiovascular;  Laterality: N/A;  . CARDIOVERSION N/A 01/09/2015   Procedure: CARDIOVERSION;  Surgeon: Pixie Casino, MD;  Location: [facility] ENDOSCOPY;  Service: Cardiovascular;  Laterality: N/A;  . LEFT HEART CATHETERIZATION WITH CORONARY ANGIOGRAM N/A 10/01/2014   Procedure: LEFT HEART CATHETERIZATION WITH  CORONARY ANGIOGRAM;  Surgeon: Troy Sine, MD;  Location: [facility] CATH LAB;  Service: Cardiovascular;  Laterality: N/A;       Home Medications    Prior to Admission medications   Medication Sig Start Date End Date Taking? Authorizing Provider  acetaminophen (TYLENOL) 650 MG CR tablet Take 650 mg by mouth every 8 (eight) hours as needed for pain.    Historical Provider, MD  amiodarone (PACERONE) 200 MG tablet TAKE 1 TABLET (200 MG TOTAL) BY MOUTH DAILY. 05/18/16   Sueanne Margarita, MD  apixaban (ELIQUIS) 5 MG TABS tablet Take 1 tablet (5 mg total) by mouth 2 (two) times daily. 12/26/14   Sueanne Margarita,  MD  Calcium Carb-Cholecalciferol 500-400 MG-UNIT TABS Take 1 tablet by mouth daily.    Historical Provider, MD  cephALEXin (KEFLEX) 500 MG capsule Take 1 capsule (500 mg total) by mouth 3 (three) times daily. 10/08/16 10/15/16  Valinda Party, DO  furosemide (LASIX) 40 MG tablet TAKE 1 TABLET (40 MG TOTAL) BY MOUTH 2 (TWO) TIMES DAILY. 07/06/16   Sueanne Margarita, MD  loratadine (CLARITIN) 10 MG tablet Take 10 mg by mouth daily as needed for allergies.    Historical Provider, MD  losartan (COZAAR) 25 MG tablet TAKE ONE TABLET BY MOUTH DAILY 06/28/16   Sueanne Margarita, MD  metoprolol succinate (TOPROL XL) 25 MG 24 hr tablet Take 1 tablet (25 mg total) by mouth daily. 05/10/16   Sueanne Margarita, MD  MULTIPLE VITAMINS PO Take 1 tablet by mouth daily.    Historical Provider, MD  polyethylene glycol (MIRALAX / GLYCOLAX) packet Take 17 g by mouth daily as needed (constipation).     Historical Provider, MD  rosuvastatin (CRESTOR) 20 MG tablet TAKE 1 TABLET (20 MG TOTAL) BY MOUTH DAILY. 07/16/16   Sueanne Margarita, MD    Family History Family History  Problem Relation Age of Onset  . Cancer Mother   . Diabetes Father   . Heart disease Sister   . Atrial fibrillation Sister   . Heart attack Maternal Grandmother   . Stroke Maternal Grandfather   . Heart attack Paternal Grandmother   . Heart attack Paternal Grandfather     Social History Social History  Substance Use Topics  . Smoking status: Former Research scientist (life sciences)  . Smokeless tobacco: Never Used  . Alcohol use No     Allergies   Simvastatin; Codeine phosphate; and Penicillins   Review of Systems Review of Systems  Eyes: Negative for pain and visual disturbance.  Respiratory: Negative for shortness of breath.   Cardiovascular: Negative for chest pain.  Musculoskeletal: Negative for neck pain.  Neurological: Negative for dizziness, syncope, weakness, numbness and headaches.     Physical Exam Updated Vital Signs BP 159/83   Pulse 60    Temp 97.4 F (36.3 C) (Oral)   Resp 18   SpO2 99%   Physical Exam  Constitutional: He is oriented to person, place, and time. He appears well-developed and well-nourished.  HENT:  Head: Normocephalic and atraumatic.  Abrasion to the forehead Laceration to bridge of nose Abrasion and mild swelling to nose  Periorbital bruising   Eyes: Pupils are equal, round, and reactive to light.  Cardiovascular: Normal rate, regular rhythm and normal heart sounds.  Exam reveals no gallop and no friction rub.   No murmur heard. Pulmonary/Chest: Effort normal and breath sounds normal. No respiratory distress. He has no wheezes. He has no rales.  Neurological: He is alert and  oriented to person, place, and time.  Skin: Skin is warm and dry.     ED Treatments / Results  Labs (all labs ordered are listed, but only abnormal results are displayed) Labs Reviewed - No data to display  EKG  EKG Interpretation None       Radiology Ct Head Wo Contrast  Result Date: 10/08/2016 CLINICAL DATA:  Fall, head injury EXAM: CT HEAD WITHOUT CONTRAST CT CERVICAL SPINE WITHOUT CONTRAST TECHNIQUE: Multidetector CT imaging of the head and cervical spine was performed following the standard protocol without intravenous contrast. Multiplanar CT image reconstructions of the cervical spine were also generated. COMPARISON:  CT head 01/01/2011 FINDINGS: CT HEAD FINDINGS Brain: Mild atrophy. Negative for acute hemorrhage. Negative for acute infarct or mass. Vascular: No hyperdense vessel or unexpected calcification. Skull: Nasal bone fracture with minimal displacement, probably acute. Laceration of the nose. Frontal scalp hematoma Sinuses/Orbits: Negative Other: None CT CERVICAL SPINE FINDINGS Alignment: Mild anterolisthesis C3-4. Skull base and vertebrae: Negative for fracture. Large hemangioma T1 vertebral body. Soft tissues and spinal canal: Negative for soft tissue mass. Bilateral carotid calcification. Disc levels:  Multilevel disc degeneration and spurring. Multilevel mild facet degeneration. Upper chest: Negative Other: None IMPRESSION: No acute intracranial abnormality Nasal bone fracture.  Frontal scalp hematoma Negative for cervical spine fracture. Electronically Signed   By: Franchot Gallo M.D.   On: 10/08/2016 16:03   Ct Cervical Spine Wo Contrast  Result Date: 10/08/2016 CLINICAL DATA:  Fall, head injury EXAM: CT HEAD WITHOUT CONTRAST CT CERVICAL SPINE WITHOUT CONTRAST TECHNIQUE: Multidetector CT imaging of the head and cervical spine was performed following the standard protocol without intravenous contrast. Multiplanar CT image reconstructions of the cervical spine were also generated. COMPARISON:  CT head 01/01/2011 FINDINGS: CT HEAD FINDINGS Brain: Mild atrophy. Negative for acute hemorrhage. Negative for acute infarct or mass. Vascular: No hyperdense vessel or unexpected calcification. Skull: Nasal bone fracture with minimal displacement, probably acute. Laceration of the nose. Frontal scalp hematoma Sinuses/Orbits: Negative Other: None CT CERVICAL SPINE FINDINGS Alignment: Mild anterolisthesis C3-4. Skull base and vertebrae: Negative for fracture. Large hemangioma T1 vertebral body. Soft tissues and spinal canal: Negative for soft tissue mass. Bilateral carotid calcification. Disc levels: Multilevel disc degeneration and spurring. Multilevel mild facet degeneration. Upper chest: Negative Other: None IMPRESSION: No acute intracranial abnormality Nasal bone fracture.  Frontal scalp hematoma Negative for cervical spine fracture. Electronically Signed   By: Franchot Gallo M.D.   On: 10/08/2016 16:03    Procedures Procedures (including critical care time)  Medications Ordered in ED Medications  Tdap (BOOSTRIX) injection 0.5 mL (0.5 mLs Intramuscular Given 10/08/16 1615)     Initial Impression / Assessment and Plan / ED Course  I have reviewed the triage vital signs and the nursing notes.  Pertinent  labs & imaging results that were available during my care of the patient were reviewed by me and considered in my medical decision making (see chart for details).   Patient presents to the ED following a mechanical fall.  He denies dizziness, weakness or loss of consciousness.  CT of head showed no acute intracranial abnormality, CT face showed nasal bone fracture and frontal scalp hematoma and CT spine was negative for spine fracture.  Placed steri strips on bridge of nose with derma bond.  Gave information to follow up with ENT and a week course of keflex.    Final Clinical Impressions(s) / ED Diagnoses   Final diagnoses:  Open fracture of nasal bone, initial encounter  New Prescriptions Discharge Medication List as of 10/08/2016  5:43 PM    START taking these medications   Details  cephALEXin (KEFLEX) 500 MG capsule Take 1 capsule (500 mg total) by mouth 3 (three) times daily., Starting Fri 10/08/2016, Until Fri 10/15/2016, [place], DO 10/09/16 [place], MD 10/10/16 1351

## 2016-10-08 NOTE — ED Notes (Addendum)
c-collar placed during triage while maintaining stabilization of c-spine

## 2016-10-19 DIAGNOSIS — G5600 Carpal tunnel syndrome, unspecified upper limb: Secondary | ICD-10-CM | POA: Diagnosis not present

## 2016-10-19 DIAGNOSIS — G629 Polyneuropathy, unspecified: Secondary | ICD-10-CM | POA: Diagnosis not present

## 2016-11-10 ENCOUNTER — Ambulatory Visit (INDEPENDENT_AMBULATORY_CARE_PROVIDER_SITE_OTHER): Payer: PPO | Admitting: Cardiology

## 2016-11-10 ENCOUNTER — Encounter: Payer: Self-pay | Admitting: Cardiology

## 2016-11-10 ENCOUNTER — Encounter (INDEPENDENT_AMBULATORY_CARE_PROVIDER_SITE_OTHER): Payer: Self-pay

## 2016-11-10 VITALS — BP 142/78 | HR 68 | Ht 71.0 in | Wt 251.0 lb

## 2016-11-10 DIAGNOSIS — G4733 Obstructive sleep apnea (adult) (pediatric): Secondary | ICD-10-CM

## 2016-11-10 DIAGNOSIS — I4819 Other persistent atrial fibrillation: Secondary | ICD-10-CM

## 2016-11-10 DIAGNOSIS — I2510 Atherosclerotic heart disease of native coronary artery without angina pectoris: Secondary | ICD-10-CM | POA: Diagnosis not present

## 2016-11-10 DIAGNOSIS — I10 Essential (primary) hypertension: Secondary | ICD-10-CM

## 2016-11-10 DIAGNOSIS — E785 Hyperlipidemia, unspecified: Secondary | ICD-10-CM

## 2016-11-10 DIAGNOSIS — I77810 Thoracic aortic ectasia: Secondary | ICD-10-CM

## 2016-11-10 DIAGNOSIS — I5042 Chronic combined systolic (congestive) and diastolic (congestive) heart failure: Secondary | ICD-10-CM | POA: Diagnosis not present

## 2016-11-10 DIAGNOSIS — I420 Dilated cardiomyopathy: Secondary | ICD-10-CM | POA: Diagnosis not present

## 2016-11-10 DIAGNOSIS — I481 Persistent atrial fibrillation: Secondary | ICD-10-CM | POA: Diagnosis not present

## 2016-11-10 NOTE — Progress Notes (Signed)
Cardiology Office Note    Date:  11/10/2016   ID:  [name], DOB 03-22-1935, MRN [id number]  PCP:  Mathews Argyle, MD  Cardiologist:  Fransico Him, MD   Chief Complaint  Patient presents with  . Coronary Artery Disease  . Hypertension  . Atrial Fibrillation  . Hyperlipidemia    History of Present Illness:  [name] is a 82 y.o. male with a history of PAF s/p DCCV 10/2014 on Eliquis and Amiodarone, mild LV dysfunction EF 40-45% and dilated aortic root at 19m with mild AI. He has ASCAD by cath with30% LM at the ostium, 70-80% ostial D2, 30-40% prox LCX, 30-40% prox and 40% mid RCA and is on medical management with EF 40%.He is doing well today.  He continues to ride his bike a little.  He is NYHA 2-2b. He denies orthopnea, PND. He denies any chest pain, SOB, DOE (except with going up stairs), palpitations, dizziness or syncope. He denies LE edema. He has mild OSA with an AHI of 10.6/hr but 38/hr during REM sleep and is on CPAP at 9cm H2O. He tolerates the CPAP well. He feels rested for the most part in the am if he slept well the night before but wakes up a lot with back pain at night.  He does nap some during the day. He sleeps 6-8 hours nightly. He tolerates the mask and feels the pressure is adequate.       Past Medical History:  Diagnosis Date  . Back pain   . Benign essential HTN 09/17/2014  . CAD (coronary artery disease), native coronary artery 09/20/2014   30% LM at the ostium, 70-80% ostial D2, 30-40% prox LCX, 30-40% prox and 40% mid RCA and EF 40%.    . D[place] (d[place]) [place] 09/17/2014   a.  EF 40%; b. Echo 5/16:  EF 40-45%, diff HK, inf AK, mild AI, MAC, severe LAE  . [place] 09/17/2014   normal diameter on echo 2017  . DJD (degenerative joint disease)    feet, knees and back  . GERD (gastroesophageal reflux disease)    hiatal hernia  . Hyperlipidemia LDL goal <70 10/14/2015  . Lesion of left femoral nerve    3  cm MRI 04/2010 consistent with hemangioma  . Mild aortic insufficiency   . Obesity   . OSA (obstructive sleep apnea) 10/14/2015   Mild with AHI 10.6/hr overall and 38/hr during REM sleep.  On CPAP at 9cm H2O  . PAF (paroxysmal atrial fibrillation) ([facility] 09/17/2014  . PSA (psoriatic arthritis) ([facility])    Mild increase 3.65 on 05/2011 stable 3.68 on 09/2011  . Renal cyst, left 2010   5 cm seen on ultrasound     Past Surgical History:  Procedure Laterality Date  . CARDIOVERSION N/A 11/04/2014   Procedure: CARDIOVERSION;  Surgeon: TSueanne Margarita MD;  [facility];  Service: Cardiovascular;  Laterality: N/A;  . CARDIOVERSION N/A 01/09/2015   Procedure: CARDIOVERSION;  Surgeon: KPixie Casino MD;  [facility]  Service: Cardiovascular;  Laterality: N/A;  . LEFT HEART CATHETERIZATION WITH CORONARY ANGIOGRAM N/A 10/01/2014   Procedure: LEFT HEART CATHETERIZATION WITH CORONARY ANGIOGRAM;  Surgeon: TTroy Sine MD;  [facility];  Service: Cardiovascular;  Laterality: N/A;    Current Medications: Current Meds  Medication Sig  . acetaminophen (TYLENOL) 650 MG CR tablet Take 650 mg by mouth every 8 (eight) hours as needed for pain.  .Marland Kitchenamiodarone (PACERONE)  200 MG tablet TAKE 1 TABLET (200 MG TOTAL) BY MOUTH DAILY.  Marland Kitchen apixaban (ELIQUIS) 5 MG TABS tablet Take 1 tablet (5 mg total) by mouth 2 (two) times daily.  . Calcium Carb-Cholecalciferol 500-400 MG-UNIT TABS Take 1 tablet by mouth daily.  . furosemide (LASIX) 40 MG tablet TAKE 1 TABLET (40 MG TOTAL) BY MOUTH 2 (TWO) TIMES DAILY.  Marland Kitchen loratadine (CLARITIN) 10 MG tablet Take 10 mg by mouth daily as needed for allergies.  Marland Kitchen losartan (COZAAR) 25 MG tablet TAKE ONE TABLET BY MOUTH DAILY  . meloxicam (MOBIC) 15 MG tablet Take 15 mg by mouth daily.   . metoprolol succinate (TOPROL XL) 25 MG 24 hr tablet Take 1 tablet (25 mg total) by mouth daily.  . MULTIPLE VITAMINS PO Take 1 tablet by mouth daily.  Marland Kitchen omeprazole  (PRILOSEC) 20 MG capsule Take 20 mg by mouth daily.   . polyethylene glycol (MIRALAX / GLYCOLAX) packet Take 17 g by mouth daily as needed (constipation).   . rosuvastatin (CRESTOR) 20 MG tablet TAKE 1 TABLET (20 MG TOTAL) BY MOUTH DAILY.    Allergies:   Simvastatin; Codeine phosphate; and Penicillins   Social History   Social History  . Marital status: Married    Spouse name: N/A  . Number of children: 4  . 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 Smoking status: Former Research scientist (life sciences)  . Smokeless tobacco: Never Used  . Alcohol use No  . Drug use: No  . Sexual activity: Yes   Other Topics Concern  . None   Social History Narrative  . None     Family History:  The patient's family history includes Atrial fibrillation in his sister; Cancer in his mother; Diabetes in his father; Heart attack in his maternal grandmother, paternal grandfather, and paternal grandmother; Heart disease in his sister; Stroke in his maternal grandfather.   ROS:   Please see the history of present illness.    ROS All other systems reviewed and are negative.  No flowsheet data found.     PHYSICAL EXAM:   VS:  BP (!) 142/78   Pulse 68   Ht _0  (1.803 m)   Wt 251 lb (113.9 kg)   BMI 35.01 kg/m    GEN: Well nourished, well developed, in no acute distress  HEENT: normal  Neck: no JVD, carotid bruits, or masses Cardiac: RRR; no murmurs, rubs, or gallops,no edema.  Intact distal pulses bilaterally.  Respiratory:  clear to auscultation bilaterally, normal work of breathing GI: soft, nontender, nondistended, + BS MS: no deformity or atrophy  Skin: warm and dry, no rash Neuro:  Alert and Oriented x 3, Strength and sensation are intact Psych: euthymic mood, full affect  Wt Readings from Last 3 Encounters:  11/10/16 251 lb (113.9 kg)  05/10/16 251 lb 1.9 oz (113.9 kg)  10/15/15 264 lb (119.7 kg)      Studies/Labs Reviewed:   EKG:   EKG is not ordered today.   Recent Labs: 05/10/2016: ALT 28; Hemoglobin 15.0; Platelets 186; TSH 0.84 10/05/2016: BUN 29; Creatinine, Ser 1.25; Potassium 4.3; Sodium 137   Lipid Panel    Component Value Date/Time   CHOL 129 05/10/2016 1145   TRIG 93 05/10/2016 1145   HDL 51 05/10/2016 1145   CHOLHDL 2.5 05/10/2016 1145   VLDL 19 05/10/2016 1145   LDLCALC 59 05/10/2016 1145    Additional studies/ records that  were reviewed today include:  CPAP download    ASSESSMENT:    1. Persistent atrial fibrillation ([place])   2. Benign essential HTN   3. Dilated aortic root ([place])   4. DCM (dilated cardiomyopathy) ([place])   5. Coronary artery disease involving native coronary artery of native heart without angina pectoris   6. Chronic combined systolic and diastolic CHF (congestive heart failure) ([place])   7. OSA (obstructive sleep apnea)   8. Hyperlipidemia LDL goal <70      PLAN:  In order of problems listed above:  1. Persistent atrial fibrillation maintaining NSR on Amio. He is having problems with numbness in his fingers so bad that he cannot button his shirts.  This started after going on Amio.  He will continue on Apixaban and I will refer him to afib clinic to consider other AAD options.  He is on meloxicam for arthritis and understands the risks associated with taking it in the setting of NOAC and accepts the risk. He is on a Prilosec.  I will check a CBC.  His creatinine was 1.25 last month.  2. HTN - BP well controlled on current meds. He will continue on Losartan and metoprolol.   3. Dilated aortic root - 38m on echo 2016 and 4.4cm by Chest CTA 09/2016.  I will repeat in 1 year.  He will continue statin and BB for BP control.    4. DCM EF 40-45% by echo 2016. 5. ASCAD -  cath with30% LM at the ostium, 70-80% ostial D2, 30-40% prox LCX, 30-40% prox and 40% mid RCA and is on medical management.  He denies any anginal symptoms.  He will continue on BB/statin. He is not on ASA due to  NOAC.   6. Chronic combined systolic/diastolic CHF - we appear well compensated and euvolemic.  His weight is stable.  He will continue on ARB, Lasix and BB.   OSA - the patient is tolerating PAP therapy well without any problems. The PAP download was reviewed today and showed an AHI of 1.8/hr on 9 cm H2O with 90% compliance in using more than 4 hours nightly.  The patient has been using and benefiting from CPAP use and will continue to benefit from therapy.  Hyperlipidemia with LDL goal < 70.  He will continue on statin and I will get an FLP and ALT.       Medication Adjustments/Labs and Tests Ordered: Current medicines are reviewed at length with the patient today.  Concerns regarding medicines are outlined above.  Medication changes, Labs and Tests ordered today are listed in the Patient Instructions below.  There are no Patient Instructions on file for this visit.   Signed, TFransico Him MD  11/10/2016 2:05 PM    [place]  [id number]: [phone] Fax: [phone]

## 2016-11-10 NOTE — Patient Instructions (Signed)
Medication Instructions:  Your physician recommends that you continue on your current medications as directed. Please refer to the Current Medication list given to you today.   Labwork: Your physician recommends that you return for FASTING lab work.  Testing/Procedures: None  Follow-Up: You have been referred to AFIB CLINIC (as soon as possible).  Your physician wants you to follow-up in: 6 months with Dr. Radford Pax. You will receive a reminder letter in the mail two months in advance. If you don't receive a letter, please call our office to schedule the follow-up appointment.   Any Other Special Instructions Will Be Listed Below (If Applicable).     If you need a refill on your cardiac medications before your next appointment, please call your pharmacy.

## 2016-11-16 ENCOUNTER — Encounter (HOSPITAL_COMMUNITY): Payer: Self-pay | Admitting: Nurse Practitioner

## 2016-11-16 ENCOUNTER — Ambulatory Visit (HOSPITAL_COMMUNITY)
Admission: RE | Admit: 2016-11-16 | Discharge: 2016-11-16 | Disposition: A | Discharge status: Home / Self Care | Payer: PPO | Source: Ambulatory Visit | Attending: Nurse Practitioner | Admitting: Nurse Practitioner

## 2016-11-16 ENCOUNTER — Other Ambulatory Visit: Payer: PPO | Admitting: *Deleted

## 2016-11-16 VITALS — BP 118/66 | HR 73 | Ht 71.0 in | Wt 245.0 lb

## 2016-11-16 DIAGNOSIS — Z6834 Body mass index (BMI) 34.0-34.9, adult: Secondary | ICD-10-CM | POA: Diagnosis not present

## 2016-11-16 DIAGNOSIS — I351 Nonrheumatic aortic (valve) insufficiency: Secondary | ICD-10-CM | POA: Diagnosis not present

## 2016-11-16 DIAGNOSIS — E785 Hyperlipidemia, unspecified: Secondary | ICD-10-CM

## 2016-11-16 DIAGNOSIS — G4733 Obstructive sleep apnea (adult) (pediatric): Secondary | ICD-10-CM | POA: Insufficient documentation

## 2016-11-16 DIAGNOSIS — Z87891 Personal history of nicotine dependence: Secondary | ICD-10-CM | POA: Diagnosis not present

## 2016-11-16 DIAGNOSIS — I481 Persistent atrial fibrillation: Secondary | ICD-10-CM | POA: Diagnosis not present

## 2016-11-16 DIAGNOSIS — Z8249 Family history of ischemic heart disease and other diseases of the circulatory system: Secondary | ICD-10-CM | POA: Diagnosis not present

## 2016-11-16 DIAGNOSIS — Z885 Allergy status to narcotic agent status: Secondary | ICD-10-CM | POA: Insufficient documentation

## 2016-11-16 DIAGNOSIS — L4050 Arthropathic psoriasis, unspecified: Secondary | ICD-10-CM | POA: Insufficient documentation

## 2016-11-16 DIAGNOSIS — R200 Anesthesia of skin: Secondary | ICD-10-CM | POA: Insufficient documentation

## 2016-11-16 DIAGNOSIS — E669 Obesity, unspecified: Secondary | ICD-10-CM | POA: Diagnosis not present

## 2016-11-16 DIAGNOSIS — M1990 Unspecified osteoarthritis, unspecified site: Secondary | ICD-10-CM | POA: Insufficient documentation

## 2016-11-16 DIAGNOSIS — I480 Paroxysmal atrial fibrillation: Secondary | ICD-10-CM

## 2016-11-16 DIAGNOSIS — Z79899 Other long term (current) drug therapy: Secondary | ICD-10-CM | POA: Insufficient documentation

## 2016-11-16 DIAGNOSIS — K219 Gastro-esophageal reflux disease without esophagitis: Secondary | ICD-10-CM | POA: Diagnosis not present

## 2016-11-16 DIAGNOSIS — Z7901 Long term (current) use of anticoagulants: Secondary | ICD-10-CM | POA: Insufficient documentation

## 2016-11-16 DIAGNOSIS — I2510 Atherosclerotic heart disease of native coronary artery without angina pectoris: Secondary | ICD-10-CM | POA: Insufficient documentation

## 2016-11-16 DIAGNOSIS — Z888 Allergy status to other drugs, medicaments and biological substances status: Secondary | ICD-10-CM | POA: Insufficient documentation

## 2016-11-16 DIAGNOSIS — I4819 Other persistent atrial fibrillation: Secondary | ICD-10-CM

## 2016-11-16 DIAGNOSIS — Z833 Family history of diabetes mellitus: Secondary | ICD-10-CM | POA: Insufficient documentation

## 2016-11-16 DIAGNOSIS — Z880 Allergy status to penicillin: Secondary | ICD-10-CM | POA: Diagnosis not present

## 2016-11-16 DIAGNOSIS — I10 Essential (primary) hypertension: Secondary | ICD-10-CM | POA: Insufficient documentation

## 2016-11-16 NOTE — Progress Notes (Signed)
Primary Care Physician: Mathews Argyle, MD Referring Physician/Cardiologsit: Dr. Marden Noble is a 82 y.o. male with a h/o CAD, OAS with cpap, obesity,and atrial fibrillation on amiodarone x 2 years. He was referred to the afib clinic for other antiarrythmic options due to pt's c/o of numb fingers. States he can't even button his shirts because he is unable to feel his buttons. Echo in 2016 showed decreased EF at 40-45% and left atrial size of 60 mm.  Today, he denies symptoms of palpitations, chest pain, shortness of breath, orthopnea, PND, lower extremity edema, dizziness, presyncope, syncope, or neurologic sequela. The patient is tolerating medications without difficulties and is otherwise without complaint today.   Past Medical History:  Diagnosis Date  . Back pain   . Benign essential HTN 09/17/2014  . CAD (coronary artery disease), native coronary artery 09/20/2014   30% LM at the ostium, 70-80% ostial D2, 30-40% prox LCX, 30-40% prox and 40% mid RCA and EF 40%.    . DCM (dilated cardiomyopathy) ([place]) 09/17/2014   a.  EF 40%; b. Echo 5/16:  EF 40-45%, diff HK, inf AK, mild AI, MAC, severe LAE  . Dilated aortic root ([place]) 09/17/2014   normal diameter on echo 2017  . DJD (degenerative joint disease)    feet, knees and back  . GERD (gastroesophageal reflux disease)    hiatal hernia  . Hyperlipidemia LDL goal <70 10/14/2015  . Lesion of left femoral nerve    3 cm MRI 04/2010 consistent with hemangioma  . Mild aortic insufficiency   . Obesity   . OSA (obstructive sleep apnea) 10/14/2015   Mild with AHI 10.6/hr overall and 38/hr during REM sleep.  On CPAP at 9cm H2O  . PAF (paroxysmal atrial fibrillation) ([place]) 09/17/2014  . PSA (psoriatic arthritis) ([facility])    Mild increase 3.65 on 05/2011 stable 3.68 on 09/2011  . Renal cyst, left 2010   5 cm seen on ultrasound    Past Surgical History:  Procedure Laterality Date  . CARDIOVERSION N/A 11/04/2014   Procedure:  CARDIOVERSION;  Surgeon: Sueanne Margarita, MD;  Location: [facility];  Service: Cardiovascular;  Laterality: N/A;  . CARDIOVERSION N/A 01/09/2015   Procedure: CARDIOVERSION;  Surgeon: Pixie Casino, MD;  Location: [facility];  Service: Cardiovascular;  Laterality: N/A;  . LEFT HEART CATHETERIZATION WITH CORONARY ANGIOGRAM N/A 10/01/2014   Procedure: LEFT HEART CATHETERIZATION WITH CORONARY ANGIOGRAM;  Surgeon: Troy Sine, MD;  [facility];  Service: Cardiovascular;  Laterality: N/A;    Current Outpatient Prescriptions  Medication Sig Dispense Refill  . acetaminophen (TYLENOL) 650 MG CR tablet Take 650 mg by mouth every 8 (eight) hours as needed for pain.    Marland Kitchen apixaban (ELIQUIS) 5 MG TABS tablet Take 1 tablet (5 mg total) by mouth 2 (two) times daily. 180 tablet 3  . Calcium Carb-Cholecalciferol 500-400 MG-UNIT TABS Take 1 tablet by mouth daily.    . furosemide (LASIX) 40 MG tablet TAKE 1 TABLET (40 MG TOTAL) BY MOUTH 2 (TWO) TIMES DAILY. 180 tablet 3  . loratadine (CLARITIN) 10 MG tablet Take 10 mg by mouth daily as needed for allergies.    Marland Kitchen losartan (COZAAR) 25 MG tablet TAKE ONE TABLET BY MOUTH DAILY 90 tablet 3  . meloxicam (MOBIC) 15 MG tablet Take 15 mg by mouth daily.     . metoprolol succinate (TOPROL XL) 25 MG 24 hr tablet Take 1 tablet (25 mg total) by mouth daily. [place]  tablet 3  . MULTIPLE VITAMINS PO Take 1 tablet by mouth daily.    Marland Kitchen omeprazole (PRILOSEC) 20 MG capsule Take 20 mg by mouth daily.     . polyethylene glycol (MIRALAX / GLYCOLAX) packet Take 17 g by mouth daily as needed (constipation).     . rosuvastatin (CRESTOR) 20 MG tablet TAKE 1 TABLET (20 MG TOTAL) BY MOUTH DAILY. 90 tablet 3   No current facility-administered medications for this encounter.     Allergies  Allergen Reactions  . Simvastatin Other (See Comments)    Muscle pain elevated CPK  . Codeine Phosphate Other (See Comments)    constipation  . Penicillins Rash    NOT AN ALLERGY PER  PT, thinks the needle used dirty    Social History   Social History  . Marital status: Married    Spouse name: N/A  . Number of children: 4  . 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 Smoking status: Former Research scientist (life sciences)  . Smokeless tobacco: Never Used  . Alcohol use No  . Drug use: No  . Sexual activity: Yes   Other Topics Concern  . Not on file   Social History Narrative  . No narrative on file    Family History  Problem Relation Age of Onset  . Cancer Mother   . Diabetes Father   . Heart disease Sister   . Atrial fibrillation Sister   . Heart attack Maternal Grandmother   . Stroke Maternal Grandfather   . Heart attack Paternal Grandmother   . Heart attack Paternal Grandfather     ROS- All systems are reviewed and negative except as per the HPI above  Physical Exam: Vitals:   11/16/16 1403  BP: 118/66  Pulse: 73  Weight: 245 lb (111.1 kg)  Height: _0  (1.803 m)   Wt Readings from Last 3 Encounters:  11/16/16 245 lb (111.1 kg)  11/10/16 251 lb (113.9 kg)  05/10/16 251 lb 1.9 oz (113.9 kg)    Labs: Lab Results  Component Value Date   NA 137 10/05/2016   K 4.3 10/05/2016   CL 97 10/05/2016   CO2 25 10/05/2016   GLUCOSE 94 10/05/2016   BUN 29 (H) 10/05/2016   CREATININE 1.25 10/05/2016   CALCIUM 9.5 10/05/2016   Lab Results  Component Value Date   INR 1.1 (H) 10/31/2014   Lab Results  Component Value Date   CHOL 129 05/10/2016   HDL 51 05/10/2016   LDLCALC 59 05/10/2016   TRIG 93 05/10/2016     GEN- The patient is well appearing, alert and oriented x 3 today.   Head- normocephalic, atraumatic Eyes-  Sclera clear, conjunctiva pink Ears- hearing intact Oropharynx- clear Neck- supple, no JVP Lymph- no cervical lymphadenopathy Lungs- Clear to ausculation bilaterally, normal work of breathing Heart- Regular rate and rhythm, no murmurs, rubs or gallops, PMI not laterally  displaced GI- soft, NT, ND, + BS Extremities- no clubbing, cyanosis, or edema MS- no significant deformity or atrophy Skin- no rash or lesion Psych- euthymic mood, full affect Neuro- strength and sensation are intact  EKG- SR with PVC's Echo 2016-Study Conclusions  - Left ventricle: The cavity size was mildly dilated. Systolic   function was mildly to moderately reduced. The estimated ejection   fraction was in the range of 40% to 45%. Diffuse hypokinesis.   There is akinesis of the basal-midinferior myocardium. - Aortic valve:  There was mild regurgitation. - Mitral valve: Calcified annulus. - Left atrium: The atrium was severely dilated.  Impressions:  - Compared to the prior study, there has been no significant   interval change.    Assessment and Plan:  1. Persistent afib Pt has been on amiodarone for 2 years and has developed numbness of his fingers He wishes to stop amiodarone and discuss other options Tikosyn or sotalol may be good options but will have to wash out amiodarone first Stop amiodarone Return in one month for amiodarone level Update echo If left atrial size has improved, he may be a candidate for an ablation but at current size, he would not be a good candidate Pt will watch HR at home and let me know if he returns to afib and needs further rate control  Right now, qtc is on the long side for tikosyn, but under the influence of amiodarone, will have to reassess qtc upon amiodarone washing out Pt will check on price of drug  f/u in one month  Butch Penny C. Bertha Lokken, [facility] [address] [place], [place] [address] [phone]

## 2016-11-16 NOTE — Progress Notes (Signed)
PFT done.  

## 2016-11-16 NOTE — Patient Instructions (Signed)
Your physician has recommended you make the following change in your medication:  1)Stop Amiodarone

## 2016-11-17 LAB — CBC WITH DIFFERENTIAL/PLATELET
BASOS ABS: 0.1 10*3/uL (ref 0.0–0.2)
Basos: 1 %
EOS (ABSOLUTE): 0.3 10*3/uL (ref 0.0–0.4)
Eos: 5 %
HEMOGLOBIN: 15 g/dL (ref 13.0–17.7)
Hematocrit: 46 % (ref 37.5–51.0)
IMMATURE GRANS (ABS): 0 10*3/uL (ref 0.0–0.1)
IMMATURE GRANULOCYTES: 0 %
LYMPHS: 28 %
Lymphocytes Absolute: 2 10*3/uL (ref 0.7–3.1)
MCH: 27.9 pg (ref 26.6–33.0)
MCHC: 32.6 g/dL (ref 31.5–35.7)
MCV: 86 fL (ref 79–97)
MONOCYTES: 10 %
Monocytes Absolute: 0.7 10*3/uL (ref 0.1–0.9)
NEUTROS PCT: 56 %
Neutrophils Absolute: 4 10*3/uL (ref 1.4–7.0)
PLATELETS: 200 10*3/uL (ref 150–379)
RBC: 5.37 x10E6/uL (ref 4.14–5.80)
RDW: 14.5 % (ref 12.3–15.4)
WBC: 7.2 10*3/uL (ref 3.4–10.8)

## 2016-11-17 LAB — BASIC METABOLIC PANEL
BUN/Creatinine Ratio: 18 (ref 10–24)
BUN: 21 mg/dL (ref 8–27)
CALCIUM: 9.7 mg/dL (ref 8.6–10.2)
CHLORIDE: 97 mmol/L (ref 96–106)
CO2: 26 mmol/L (ref 18–29)
Creatinine, Ser: 1.19 mg/dL (ref 0.76–1.27)
GFR calc Af Amer: 67 mL/min/{1.73_m2} (ref >59)
GFR calc non Af Amer: 58 mL/min/{1.73_m2} — ABNORMAL LOW (ref >59)
Glucose: 89 mg/dL (ref 65–99)
POTASSIUM: 4.5 mmol/L (ref 3.5–5.2)
Sodium: 140 mmol/L (ref 134–144)

## 2016-11-17 LAB — LIPID PANEL
CHOL/HDL RATIO: 3.2 ratio (ref 0.0–5.0)
Cholesterol, Total: 142 mg/dL (ref 100–199)
HDL: 45 mg/dL (ref >39)
LDL CALC: 72 mg/dL (ref 0–99)
Triglycerides: 124 mg/dL (ref 0–149)
VLDL CHOLESTEROL CAL: 25 mg/dL (ref 5–40)

## 2016-11-17 LAB — HEPATIC FUNCTION PANEL
ALT: 35 IU/L (ref 0–44)
AST: 35 IU/L (ref 0–40)
Albumin: 4.3 g/dL (ref 3.5–4.8)
Alkaline Phosphatase: 62 IU/L (ref 39–117)
BILIRUBIN TOTAL: 0.5 mg/dL (ref 0.0–1.2)
BILIRUBIN, DIRECT: 0.15 mg/dL (ref 0.00–0.40)
TOTAL PROTEIN: 7.3 g/dL (ref 6.0–8.5)

## 2016-11-25 ENCOUNTER — Ambulatory Visit (HOSPITAL_COMMUNITY)
Admission: RE | Admit: 2016-11-25 | Discharge: 2016-11-25 | Disposition: A | Discharge status: Home / Self Care | Payer: PPO | Source: Ambulatory Visit | Attending: Nurse Practitioner | Admitting: Nurse Practitioner

## 2016-11-25 DIAGNOSIS — Z720 Tobacco use: Secondary | ICD-10-CM | POA: Insufficient documentation

## 2016-11-25 DIAGNOSIS — I77810 Thoracic aortic ectasia: Secondary | ICD-10-CM | POA: Insufficient documentation

## 2016-11-25 DIAGNOSIS — I481 Persistent atrial fibrillation: Secondary | ICD-10-CM | POA: Diagnosis not present

## 2016-11-25 DIAGNOSIS — I361 Nonrheumatic tricuspid (valve) insufficiency: Secondary | ICD-10-CM | POA: Insufficient documentation

## 2016-11-25 DIAGNOSIS — I119 Hypertensive heart disease without heart failure: Secondary | ICD-10-CM | POA: Diagnosis not present

## 2016-11-25 DIAGNOSIS — I4819 Other persistent atrial fibrillation: Secondary | ICD-10-CM

## 2016-11-25 DIAGNOSIS — I420 Dilated cardiomyopathy: Secondary | ICD-10-CM | POA: Diagnosis not present

## 2016-11-25 DIAGNOSIS — I2510 Atherosclerotic heart disease of native coronary artery without angina pectoris: Secondary | ICD-10-CM | POA: Insufficient documentation

## 2016-11-25 DIAGNOSIS — I351 Nonrheumatic aortic (valve) insufficiency: Secondary | ICD-10-CM | POA: Diagnosis not present

## 2016-11-25 NOTE — Progress Notes (Signed)
  Echocardiogram 2D Echocardiogram has been performed.  [name] 11/25/2016, 2:20 PM

## 2016-12-10 DIAGNOSIS — G4733 Obstructive sleep apnea (adult) (pediatric): Secondary | ICD-10-CM | POA: Diagnosis not present

## 2016-12-10 DIAGNOSIS — Z9660 Presence of unspecified orthopedic joint implant: Secondary | ICD-10-CM | POA: Diagnosis not present

## 2016-12-16 ENCOUNTER — Ambulatory Visit (HOSPITAL_COMMUNITY)
Admission: RE | Admit: 2016-12-16 | Discharge: 2016-12-16 | Disposition: A | Discharge status: Home / Self Care | Payer: PPO | Source: Ambulatory Visit | Attending: Nurse Practitioner | Admitting: Nurse Practitioner

## 2016-12-16 ENCOUNTER — Encounter (HOSPITAL_COMMUNITY): Payer: Self-pay | Admitting: Nurse Practitioner

## 2016-12-16 VITALS — BP 136/70 | HR 62 | Ht 71.0 in | Wt 245.0 lb

## 2016-12-16 DIAGNOSIS — Z87891 Personal history of nicotine dependence: Secondary | ICD-10-CM | POA: Diagnosis not present

## 2016-12-16 DIAGNOSIS — M1990 Unspecified osteoarthritis, unspecified site: Secondary | ICD-10-CM | POA: Insufficient documentation

## 2016-12-16 DIAGNOSIS — Z823 Family history of stroke: Secondary | ICD-10-CM | POA: Diagnosis not present

## 2016-12-16 DIAGNOSIS — I2510 Atherosclerotic heart disease of native coronary artery without angina pectoris: Secondary | ICD-10-CM | POA: Insufficient documentation

## 2016-12-16 DIAGNOSIS — E785 Hyperlipidemia, unspecified: Secondary | ICD-10-CM | POA: Diagnosis not present

## 2016-12-16 DIAGNOSIS — Z833 Family history of diabetes mellitus: Secondary | ICD-10-CM | POA: Insufficient documentation

## 2016-12-16 DIAGNOSIS — K449 Diaphragmatic hernia without obstruction or gangrene: Secondary | ICD-10-CM | POA: Diagnosis not present

## 2016-12-16 DIAGNOSIS — I481 Persistent atrial fibrillation: Secondary | ICD-10-CM | POA: Diagnosis not present

## 2016-12-16 DIAGNOSIS — Z888 Allergy status to other drugs, medicaments and biological substances status: Secondary | ICD-10-CM | POA: Insufficient documentation

## 2016-12-16 DIAGNOSIS — G4733 Obstructive sleep apnea (adult) (pediatric): Secondary | ICD-10-CM | POA: Insufficient documentation

## 2016-12-16 DIAGNOSIS — I351 Nonrheumatic aortic (valve) insufficiency: Secondary | ICD-10-CM | POA: Insufficient documentation

## 2016-12-16 DIAGNOSIS — R200 Anesthesia of skin: Secondary | ICD-10-CM | POA: Diagnosis not present

## 2016-12-16 DIAGNOSIS — Z880 Allergy status to penicillin: Secondary | ICD-10-CM | POA: Diagnosis not present

## 2016-12-16 DIAGNOSIS — Z8249 Family history of ischemic heart disease and other diseases of the circulatory system: Secondary | ICD-10-CM | POA: Diagnosis not present

## 2016-12-16 DIAGNOSIS — I420 Dilated cardiomyopathy: Secondary | ICD-10-CM | POA: Insufficient documentation

## 2016-12-16 DIAGNOSIS — Z809 Family history of malignant neoplasm, unspecified: Secondary | ICD-10-CM | POA: Diagnosis not present

## 2016-12-16 DIAGNOSIS — I10 Essential (primary) hypertension: Secondary | ICD-10-CM | POA: Diagnosis not present

## 2016-12-16 DIAGNOSIS — Z79899 Other long term (current) drug therapy: Secondary | ICD-10-CM | POA: Insufficient documentation

## 2016-12-16 DIAGNOSIS — Z7901 Long term (current) use of anticoagulants: Secondary | ICD-10-CM | POA: Diagnosis not present

## 2016-12-16 DIAGNOSIS — E669 Obesity, unspecified: Secondary | ICD-10-CM | POA: Diagnosis not present

## 2016-12-16 DIAGNOSIS — I493 Ventricular premature depolarization: Secondary | ICD-10-CM | POA: Insufficient documentation

## 2016-12-16 DIAGNOSIS — Z9889 Other specified postprocedural states: Secondary | ICD-10-CM | POA: Diagnosis not present

## 2016-12-16 DIAGNOSIS — K219 Gastro-esophageal reflux disease without esophagitis: Secondary | ICD-10-CM | POA: Insufficient documentation

## 2016-12-16 DIAGNOSIS — I4819 Other persistent atrial fibrillation: Secondary | ICD-10-CM

## 2016-12-16 NOTE — Progress Notes (Addendum)
Primary Care Physician: Mathews Argyle, MD Referring Physician/Cardiologsit: Dr. Marden Noble is a 82 y.o. male with a h/o CAD, OAS with cpap, obesity,and atrial fibrillation on amiodarone x 2 years. He was referred to the afib clinic for other antiarrythmic options due to pt's c/o of numb fingers. States he can't even button his shirts because he is unable to feel his buttons. Echo in 2016 showed decreased EF at 40-45% and left atrial size of 60 mm.  Returns to afib clinic, 5/3, now one month off amiodarone, staying in [place]. Has noticed a small amount of improvement of numbness of fingers in one hand. He is staying in [place]. Amiodarone level to be done today. Repeat echo show improvement in EF to normal range and left atrial size reduced in size from 60 mm to 52 mm. He states that as long as he is in [place], he would like to remain off AAD, he was reminded that there is no guarantee how long this honey moon may last.  Today, he denies symptoms of palpitations, chest pain, shortness of breath, orthopnea, PND, lower extremity edema, dizziness, presyncope, syncope, or neurologic sequela. The patient is tolerating medications without difficulties and is otherwise without complaint today.   Past Medical History:  Diagnosis Date  . Back pain   . Benign essential HTN 09/17/2014  . CAD (coronary artery disease), native coronary artery 09/20/2014   30% LM at the ostium, 70-80% ostial D2, 30-40% prox LCX, 30-40% prox and 40% mid RCA and EF 40%.    . DCM (dilated cardiomyopathy) ([place]) 09/17/2014   a.  EF 40%; b. Echo 5/16:  EF 40-45%, diff HK, inf AK, mild AI, MAC, severe LAE  . Dilated aortic root ([place]) 09/17/2014   normal diameter on echo 2017  . DJD (degenerative joint disease)    feet, knees and back  . GERD (gastroesophageal reflux disease)    hiatal hernia  . Hyperlipidemia LDL goal <70 10/14/2015  . Lesion of left femoral nerve    3 cm MRI 04/2010 consistent with hemangioma  . Mild aortic  insufficiency   . Obesity   . OSA (obstructive sleep apnea) 10/14/2015   Mild with AHI 10.6/hr overall and 38/hr during REM sleep.  On CPAP at 9cm H2O  . PAF (paroxysmal atrial fibrillation) ([place]) 09/17/2014  . PSA (psoriatic arthritis) (HCC)    Mild increase 3.65 on 05/2011 stable 3.68 on 09/2011  . Renal cyst, left 2010   5 cm seen on ultrasound    Past Surgical History:  Procedure Laterality Date  . CARDIOVERSION N/A 11/04/2014   Procedure: CARDIOVERSION;  Surgeon: Sueanne Margarita, MD;  Location: [facility];  Service: Cardiovascular;  Laterality: N/A;  . CARDIOVERSION N/A 01/09/2015   Procedure: CARDIOVERSION;  Surgeon: Pixie Casino, MD;  Location: [facility];  Service: Cardiovascular;  Laterality: N/A;  . LEFT HEART CATHETERIZATION WITH CORONARY ANGIOGRAM N/A 10/01/2014   Procedure: LEFT HEART CATHETERIZATION WITH CORONARY ANGIOGRAM;  Surgeon: Troy Sine, MD;  Location: [facility] CATH LAB;  Service: Cardiovascular;  Laterality: N/A;    Current Outpatient Prescriptions  Medication Sig Dispense Refill  . acetaminophen (TYLENOL) 650 MG CR tablet Take 650 mg by mouth every 8 (eight) hours as needed for pain.    Marland Kitchen apixaban (ELIQUIS) 5 MG TABS tablet Take 1 tablet (5 mg total) by mouth 2 (two) times daily. 180 tablet 3  . Calcium Carb-Cholecalciferol 500-400 MG-UNIT TABS Take 1 tablet by mouth daily.    Marland Kitchen  furosemide (LASIX) 40 MG tablet TAKE 1 TABLET (40 MG TOTAL) BY MOUTH 2 (TWO) TIMES DAILY. 180 tablet 3  . loratadine (CLARITIN) 10 MG tablet Take 10 mg by mouth daily as needed for allergies.    Marland Kitchen losartan (COZAAR) 25 MG tablet TAKE ONE TABLET BY MOUTH DAILY 90 tablet 3  . meloxicam (MOBIC) 15 MG tablet Take 15 mg by mouth daily.     . metoprolol succinate (TOPROL XL) 25 MG 24 hr tablet Take 1 tablet (25 mg total) by mouth daily. 180 tablet 3  . MULTIPLE VITAMINS PO Take 1 tablet by mouth daily.    Marland Kitchen omeprazole (PRILOSEC) 20 MG capsule Take 20 mg by mouth daily.     . polyethylene glycol  (MIRALAX / GLYCOLAX) packet Take 17 g by mouth daily as needed (constipation).     . rosuvastatin (CRESTOR) 20 MG tablet TAKE 1 TABLET (20 MG TOTAL) BY MOUTH DAILY. 90 tablet 3   No current facility-administered medications for this encounter.     Allergies  Allergen Reactions  . Simvastatin Other (See Comments)    Muscle pain elevated CPK  . Codeine Phosphate Other (See Comments)    constipation  . Penicillins Rash    NOT AN ALLERGY PER PT, thinks the needle used dirty    Social History   Social History  . Marital status: Married    Spouse name: N/A  . Number of children: 4  . 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 Smoking status: Former Research scientist (life sciences)  . Smokeless tobacco: Never Used  . Alcohol use No  . Drug use: No  . Sexual activity: Yes   Other Topics Concern  . Not on file   Social History Narrative  . No narrative on file    Family History  Problem Relation Age of Onset  . Cancer Mother   . Diabetes Father   . Heart disease Sister   . Atrial fibrillation Sister   . Heart attack Maternal Grandmother   . Stroke Maternal Grandfather   . Heart attack Paternal Grandmother   . Heart attack Paternal Grandfather     ROS- All systems are reviewed and negative except as per the HPI above  Physical Exam: Vitals:   12/16/16 1102  BP: 136/70  Pulse: 62  Weight: 245 lb (111.1 kg)  Height: 5' 11"  (1.803 m)   Wt Readings from Last 3 Encounters:  12/16/16 245 lb (111.1 kg)  11/16/16 245 lb (111.1 kg)  11/10/16 251 lb (113.9 kg)    Labs: Lab Results  Component Value Date   NA 140 11/16/2016   K 4.5 11/16/2016   CL 97 11/16/2016   CO2 26 11/16/2016   GLUCOSE 89 11/16/2016   BUN 21 11/16/2016   CREATININE 1.19 11/16/2016   CALCIUM 9.7 11/16/2016   Lab Results  Component Value Date   INR 1.1 (H) 10/31/2014   Lab Results  Component Value Date   CHOL 142 11/16/2016   HDL 45 11/16/2016    LDLCALC 72 11/16/2016   TRIG 124 11/16/2016     GEN- The patient is well appearing, alert and oriented x 3 today.   Head- normocephalic, atraumatic Eyes-  Sclera clear, conjunctiva pink Ears- hearing intact Oropharynx- clear Neck- supple, no JVP Lymph- no cervical lymphadenopathy Lungs- Clear to ausculation bilaterally, normal work of breathing Heart- Regular rate and rhythm, no murmurs, rubs or gallops, PMI not laterally displaced  GI- soft, NT, ND, + BS Extremities- no clubbing, cyanosis, or edema MS- no significant deformity or atrophy Skin- no rash or lesion Psych- euthymic mood, full affect Neuro- strength and sensation are intact  EKG- NSR at 62 NSR pr int 184 ms, qrs int 102 ms, qtc 454 ms Echo 2018-Study Conclusions  - Left ventricle: The cavity size was normal. There was moderate   concentric hypertrophy. Systolic function was normal. The   estimated ejection fraction was in the range of 50% to 55%.   Abnormal GLPSS at -15%. Wall motion was normal; there were no   regional wall motion abnormalities. Doppler parameters are   consistent with abnormal left ventricular relaxation (grade 1   diastolic dysfunction). The E/e&' ratio is between 8-15,   suggesting indeterminate LV filling pressure. - Aortic valve: Trileaflet; mildly calcified leaflets. There was   mild regurgitation. - Aorta: Dilated aortic root (4.4 cm) and ascending aorta (4.0 cm).   Aortic root dimension: 44 mm (ED). - Mitral valve: Mildly thickened leaflets . - Left atrium: The atrium was mildly dilated. - Right atrium: The atrium was mildly dilated. - Tricuspid valve: There was mild regurgitation. - Pulmonary arteries: PA peak pressure: 25 mm Hg (S). - Inferior vena cava: The vessel was normal in size. The   respirophasic diameter changes were in the normal range (>= 50%),   consistent with normal central venous pressure.  Impressions:  - Compared to a prior study in 2016, the LVEF has improved  to   50-55%. The aortic root ascending aorta are dilated.  -------------------------------------------------------------------     Assessment and Plan:  1. Persistent afib Pt has been on amiodarone for 2 years and has developed numbness of his fingers He wished to stop amiodarone and discuss other options Tikosyn or sotalol may be good options but will have to wash out amiodarone first Amiodarone level today Left atrial size has improved and EF by echo shows improvement, but still left atrium still large for ablation at 52 mm PT states as long as he is afib free, he would like to put off tikosyn  f/u with Dr. Radford Pax as scheduled, afib clinic when returns to [place]. [place], [facility] [address] [place], [place] [address] [phone]

## 2016-12-17 LAB — AMIODARONE LEVEL
Amiodarone Lvl: 0.6 ug/mL — ABNORMAL LOW (ref 1.0–2.5)
N-Desethyl-Amiodarone: 0.5 ug/mL — ABNORMAL LOW (ref 1.0–2.5)

## 2016-12-21 ENCOUNTER — Encounter (HOSPITAL_COMMUNITY): Payer: Self-pay | Admitting: *Deleted

## 2017-02-10 DIAGNOSIS — H40003 Preglaucoma, unspecified, bilateral: Secondary | ICD-10-CM | POA: Diagnosis not present

## 2017-02-10 DIAGNOSIS — H02411 Mechanical ptosis of right eyelid: Secondary | ICD-10-CM | POA: Diagnosis not present

## 2017-02-10 DIAGNOSIS — H26492 Other secondary cataract, left eye: Secondary | ICD-10-CM | POA: Diagnosis not present

## 2017-02-10 DIAGNOSIS — H1851 Endothelial corneal dystrophy: Secondary | ICD-10-CM | POA: Diagnosis not present

## 2017-02-17 ENCOUNTER — Telehealth: Payer: Self-pay | Admitting: Cardiology

## 2017-02-17 NOTE — Telephone Encounter (Signed)
New Message  Pt call requesting to make an appt. Pt now states he will be out of town the whole month of October and would like to know IF POSSIBLE could he be schedule for a sooner appt. Please call back to discuss

## 2017-02-17 NOTE — Telephone Encounter (Signed)
Called pt back and offered him 04/12/17 arriving at 2:45 for a 3:00 appt with Dr. Radford Pax. Pt agreeable and verbalized appreciation.

## 2017-02-18 ENCOUNTER — Other Ambulatory Visit (HOSPITAL_COMMUNITY): Payer: PPO | Admitting: Nurse Practitioner

## 2017-02-18 ENCOUNTER — Ambulatory Visit (HOSPITAL_COMMUNITY)
Admission: RE | Admit: 2017-02-18 | Discharge: 2017-02-18 | Disposition: A | Discharge status: Home / Self Care | Payer: PPO | Source: Ambulatory Visit | Attending: Nurse Practitioner | Admitting: Nurse Practitioner

## 2017-02-18 DIAGNOSIS — I481 Persistent atrial fibrillation: Secondary | ICD-10-CM | POA: Insufficient documentation

## 2017-02-22 ENCOUNTER — Other Ambulatory Visit (HOSPITAL_COMMUNITY): Payer: Self-pay | Admitting: *Deleted

## 2017-02-22 DIAGNOSIS — I480 Paroxysmal atrial fibrillation: Secondary | ICD-10-CM

## 2017-02-22 LAB — AMIODARONE LEVEL
Amiodarone Lvl: 0.3 ug/mL — ABNORMAL LOW (ref 1.0–2.5)
N-Desethyl-Amiodarone: NOT DETECTED ug/mL (ref 1.0–2.5)

## 2017-03-07 DIAGNOSIS — G8929 Other chronic pain: Secondary | ICD-10-CM | POA: Diagnosis not present

## 2017-03-07 DIAGNOSIS — K5900 Constipation, unspecified: Secondary | ICD-10-CM | POA: Diagnosis not present

## 2017-03-07 DIAGNOSIS — I509 Heart failure, unspecified: Secondary | ICD-10-CM | POA: Diagnosis not present

## 2017-03-07 DIAGNOSIS — M545 Low back pain: Secondary | ICD-10-CM | POA: Diagnosis not present

## 2017-03-07 DIAGNOSIS — I10 Essential (primary) hypertension: Secondary | ICD-10-CM | POA: Diagnosis not present

## 2017-03-07 DIAGNOSIS — I480 Paroxysmal atrial fibrillation: Secondary | ICD-10-CM | POA: Diagnosis not present

## 2017-03-08 ENCOUNTER — Other Ambulatory Visit: Payer: Self-pay | Admitting: Dermatology

## 2017-03-08 DIAGNOSIS — L820 Inflamed seborrheic keratosis: Secondary | ICD-10-CM | POA: Diagnosis not present

## 2017-03-08 DIAGNOSIS — L570 Actinic keratosis: Secondary | ICD-10-CM | POA: Diagnosis not present

## 2017-03-08 DIAGNOSIS — D229 Melanocytic nevi, unspecified: Secondary | ICD-10-CM | POA: Diagnosis not present

## 2017-03-08 DIAGNOSIS — D492 Neoplasm of unspecified behavior of bone, soft tissue, and skin: Secondary | ICD-10-CM | POA: Diagnosis not present

## 2017-04-12 ENCOUNTER — Encounter: Payer: Self-pay | Admitting: Cardiology

## 2017-04-12 ENCOUNTER — Ambulatory Visit (INDEPENDENT_AMBULATORY_CARE_PROVIDER_SITE_OTHER): Payer: PPO | Admitting: Cardiology

## 2017-04-12 VITALS — BP 122/64 | HR 63 | Ht 71.0 in | Wt 235.0 lb

## 2017-04-12 DIAGNOSIS — I2510 Atherosclerotic heart disease of native coronary artery without angina pectoris: Secondary | ICD-10-CM | POA: Diagnosis not present

## 2017-04-12 DIAGNOSIS — I4819 Other persistent atrial fibrillation: Secondary | ICD-10-CM

## 2017-04-12 DIAGNOSIS — I480 Paroxysmal atrial fibrillation: Secondary | ICD-10-CM

## 2017-04-12 DIAGNOSIS — I77810 Thoracic aortic ectasia: Secondary | ICD-10-CM | POA: Diagnosis not present

## 2017-04-12 DIAGNOSIS — G4733 Obstructive sleep apnea (adult) (pediatric): Secondary | ICD-10-CM | POA: Diagnosis not present

## 2017-04-12 DIAGNOSIS — I481 Persistent atrial fibrillation: Secondary | ICD-10-CM | POA: Diagnosis not present

## 2017-04-12 DIAGNOSIS — I5042 Chronic combined systolic (congestive) and diastolic (congestive) heart failure: Secondary | ICD-10-CM | POA: Diagnosis not present

## 2017-04-12 DIAGNOSIS — I10 Essential (primary) hypertension: Secondary | ICD-10-CM

## 2017-04-12 DIAGNOSIS — E785 Hyperlipidemia, unspecified: Secondary | ICD-10-CM | POA: Diagnosis not present

## 2017-04-12 DIAGNOSIS — I5023 Acute on chronic systolic (congestive) heart failure: Secondary | ICD-10-CM | POA: Diagnosis not present

## 2017-04-12 DIAGNOSIS — I420 Dilated cardiomyopathy: Secondary | ICD-10-CM | POA: Diagnosis not present

## 2017-04-12 MED ORDER — FUROSEMIDE 20 MG PO TABS: 20.0000 mg | ORAL_TABLET | Freq: Every day | ORAL | 11 refills | Status: DC

## 2017-04-12 NOTE — Patient Instructions (Signed)
Medication Instructions:  1) DECREASE LASIX to 20 mg daily. You may take an extra 20 mg daily as needed for swelling or weight gain of 3 pounds.  Labwork: TODAY: BMET, LFTs, Lipids, Amiodarone  Testing/Procedures: None  Follow-Up: Your provider wants you to follow-up in: 6 months with Dr. Radford Pax. You will receive a reminder letter in the mail two months in advance. If you don't receive a letter, please call our office to schedule the follow-up appointment.    Any Other Special Instructions Will Be Listed Below (If Applicable).     If you need a refill on your cardiac medications before your next appointment, please call your pharmacy.

## 2017-04-12 NOTE — Progress Notes (Signed)
Cardiology Office Note:    Date:  04/12/2017   ID:  [name], DOB [birth year], MRN [id number]  PCP:  Lajean Manes, MD  Cardiologist:  Fransico Him, MD   Referring MD: Lajean Manes, MD   Chief Complaint  Patient presents with  . Coronary Artery Disease  . Hypertension  . Atrial Fibrillation  . Hyperlipidemia  . Sleep Apnea    History of Present Illness:    [name] is a 82 y.o. male with a hx of PAF s/p DCCV 10/2014 on Eliquis and Amiodarone, mild LV dysfunction EF 40-45% with NYHA class 2b HF and dilated aortic root at 87m with mild AI. He has ASCAD by cath with30% LM at the ostium, 70-80% ostial D2, 30-40% prox LCX, 30-40% prox and 40% mid RCA and is on medical management with EF 40%.He has mild OSA with an AHI of 10.6/hr but 38/hr during REM sleep and is on CPAP at 9cm H2O.   He is here today for followup and is doing well today. He denies any chest pain or pressure, SOB, DOE, PND, orthopnea, LE edema, dizziness or palpitations. He is very active and rides his bike daily and usually around 50 miles daily.    He was referred to afib clinic because he was having problems with numb fingers on Amio.  His Amio was stopped and he thinks the numbness has improved some but is still there.  He says that he discussed Tikosyn but wants to wait to see if his afib reoccurs.     He is doing well with his CPAP. He continues to feel rested for the most part in the am if he slept well the night before but still wakes up a lot with back pain at night. He sleeps about 6 hours nightly.  He does nap some during theday. He tolerates the mask and feels the pressure is adequate.   Past Medical History:  Diagnosis Date  . Back pain   . Benign essential HTN 09/17/2014  . CAD (coronary artery disease), native coronary artery 09/20/2014   30% LM at the ostium, 70-80% ostial D2, 30-40% prox LCX, 30-40% prox and 40% mid RCA and EF 40%.    . DCM (dilated cardiomyopathy) ([place] 09/17/2014   a.   EF 40%; b. Echo 5/16:  EF 40-45%, diff HK, inf AK, mild AI, MAC, severe LAE  . Dilated aortic root ([place] 09/17/2014   normal diameter on echo 2017  . DJD (degenerative joint disease)    feet, knees and back  . GERD (gastroesophageal reflux disease)    hiatal hernia  . Hyperlipidemia LDL goal <70 10/14/2015  . Lesion of left femoral nerve    3 cm MRI 04/2010 consistent with hemangioma  . Mild aortic insufficiency   . Obesity   . OSA (obstructive sleep apnea) 10/14/2015   Mild with AHI 10.6/hr overall and 38/hr during REM sleep.  On CPAP at 9cm H2O  . PAF (paroxysmal atrial fibrillation) ([place] 09/17/2014  . PSA (psoriatic arthritis) (HCC)    Mild increase 3.65 on 05/2011 stable 3.68 on 09/2011  . Renal cyst, left 2010   5 cm seen on ultrasound     Past Surgical History:  Procedure Laterality Date  . CARDIOVERSION N/A 11/04/2014   Procedure: CARDIOVERSION;  Surgeon: TSueanne Margarita MD;  [place]  Service: Cardiovascular;  Laterality: N/A;  . CARDIOVERSION N/A 01/09/2015   Procedure: CARDIOVERSION;  Surgeon: KPixie Casino MD;  [facility];  Service: Cardiovascular;  Laterality: N/A;  . LEFT HEART CATHETERIZATION WITH CORONARY ANGIOGRAM N/A 10/01/2014   Procedure: LEFT HEART CATHETERIZATION WITH CORONARY ANGIOGRAM;  Surgeon: Troy Sine, MD;  Location: [facility] CATH LAB;  Service: Cardiovascular;  Laterality: N/A;    Current Medications: Current Meds  Medication Sig  . acetaminophen (TYLENOL) 650 MG CR tablet Take 650 mg by mouth every 8 (eight) hours as needed for pain.  Marland Kitchen apixaban (ELIQUIS) 5 MG TABS tablet Take 1 tablet (5 mg total) by mouth 2 (two) times daily.  . Calcium Carb-Cholecalciferol 500-400 MG-UNIT TABS Take 1 tablet by mouth daily.  . furosemide (LASIX) 40 MG tablet TAKE 1 TABLET (40 MG TOTAL) BY MOUTH 2 (TWO) TIMES DAILY.  Marland Kitchen loratadine (CLARITIN) 10 MG tablet Take 10 mg by mouth daily as needed for allergies.  Marland Kitchen losartan (COZAAR) 25 MG tablet TAKE  ONE TABLET BY MOUTH DAILY  . meloxicam (MOBIC) 15 MG tablet Take 15 mg by mouth daily.   . metoprolol succinate (TOPROL XL) 25 MG 24 hr tablet Take 1 tablet (25 mg total) by mouth daily.  . MULTIPLE VITAMINS PO Take 1 tablet by mouth daily.  Marland Kitchen omeprazole (PRILOSEC) 20 MG capsule Take 20 mg by mouth daily.   . polyethylene glycol (MIRALAX / GLYCOLAX) packet Take 17 g by mouth daily as needed (constipation).   . rosuvastatin (CRESTOR) 20 MG tablet TAKE 1 TABLET (20 MG TOTAL) BY MOUTH DAILY.     Allergies:   Simvastatin; Codeine phosphate; and Penicillins   Social History   Social History  . Marital status: Married    Spouse name: N/A  . Number of children: 4  . 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 Smoking status: Former Research scientist (life sciences)  . Smokeless tobacco: Never Used  . Alcohol use No  . Drug use: No  . Sexual activity: Yes   Other Topics Concern  . None   Social History Narrative  . None     Family History: The patient's family history includes Atrial fibrillation in his sister; Cancer in his mother; Diabetes in his father; Heart attack in his maternal grandmother, paternal grandfather, and paternal grandmother; Heart disease in his sister; Stroke in his maternal grandfather.  ROS:   Please see the history of present illness.     All other systems reviewed and are negative.  EKGs/Labs/Other Studies Reviewed:    The following studies were reviewed today: CPAP download  EKG:  EKG is not ordered today.    Recent Labs: 05/10/2016: TSH 0.84 11/16/2016: ALT 35; BUN 21; Creatinine, Ser 1.19; Hemoglobin 15.0; Platelets 200; Potassium 4.5; Sodium 140   Recent Lipid Panel    Component Value Date/Time   CHOL 142 11/16/2016 1235   TRIG 124 11/16/2016 1235   HDL 45 11/16/2016 1235   CHOLHDL 3.2 11/16/2016 1235   CHOLHDL 2.5 05/10/2016 1145   VLDL 19 05/10/2016 1145   LDLCALC 72 11/16/2016 1235    Physical Exam:      VS:  BP 122/64   Pulse 63   Ht _0  (1.803 m)   Wt 235 lb (106.6 kg)   SpO2 98%   BMI 32.78 kg/m     Wt Readings from Last 3 Encounters:  04/12/17 235 lb (106.6 kg)  12/16/16 245 lb (111.1 kg)  11/16/16 245 lb (111.1 kg)     GEN:  Well nourished, well developed in no acute distress HEENT:  Normal NECK: No JVD; No carotid bruits LYMPHATICS: No lymphadenopathy CARDIAC: RRR, no murmurs, rubs, gallops RESPIRATORY:  Clear to auscultation without rales, wheezing or rhonchi  ABDOMEN: Soft, non-tender, non-distended MUSCULOSKELETAL:  No edema; No deformity  SKIN: Warm and dry NEUROLOGIC:  Alert and oriented x 3 PSYCHIATRIC:  Normal affect   ASSESSMENT:    1. Persistent atrial fibrillation ([place])   3. Benign essential HTN   4. Dilated aortic root (HCC)   5. DCM (dilated cardiomyopathy) ([place])   6. Chronic combined systolic and diastolic CHF (congestive heart failure) ([place])   7. Coronary artery disease involving native coronary artery of native heart without angina pectoris   8. OSA (obstructive sleep apnea)   9. Hyperlipidemia LDL goal <70    PLAN:    In order of problems listed above:  1. Persistent atrial fibrillation - He is maintaining NSR off Amio.  His Amio was stopped due to problems with numbness in his fingers which has improved but still there some.  He has not had any palpitations.  He was seen in afib clinic and discussed Tikosyn but wants to wait and see if he has a reoccurence of afib before committing.  He would like is Amio level checked today to make sure it is 0.  He will continue on Apixaban 52m BID for CHADS2VASC score of 5.   2. HTN  - BP is well controlled on exam today.  He will continue on Losartan 276mdaily and Toprol XL 2546maily.  3. Dilated aortic root - Chest CT 09/2016 measuring 4.5cm at sinuses of Valsalva, 4cm at sinotubular junction and 4.1cm in ascending aorta. This corresponds to echo in 11/2016.  He has a repeat Chest CT planned for  09/2017.  4. DCM - EF 50-55% by echo 11/2016  5. Chronic combined systolic/diastolic CHF - He is NYHA class 2 HF.  He has lost 16lbs with diet and exercise.  He has noticed that he is now having problems with constipation and thinks he is dry from the lasix.  He has no evidence of volume overload on exam.  I have instructed him to decrease lasix to 67m53mily and then take an extra lasix 67mg33mneeded for edema or weight gain more than 3 lbs in a day.  He will continue on Losartan 25mg 46my and Toprol XL 25mg d72m.  I will check a BMET today.   6. ASCAD   OSA - the patient is tolerating PAP therapy well without any problems. The PAP download was reviewed today and showed an AHI of 1.5/hr on 9 cm H2O with 93% compliance in using more than 4 hours nightly.  The patient has been using and benefiting from CPAP use and will continue to benefit from therapy.    Hyperlipidemia with LDL goal < 70.  He will continue on Crestor 67mg da36m  He is fasting today so I will get an FLP and ALT.       Medication Adjustments/Labs and Tests Ordered: Current medicines are reviewed at length with the patient today.  Concerns regarding medicines are outlined above.  No orders of the defined types were placed in this encounter.  No orders of the defined types were placed in this encounter.   Signed, Justyne Roell TuFransico Him28/2018 12:12 PM    [facility]

## 2017-04-13 ENCOUNTER — Telehealth: Payer: Self-pay

## 2017-04-13 DIAGNOSIS — E785 Hyperlipidemia, unspecified: Secondary | ICD-10-CM

## 2017-04-13 LAB — BASIC METABOLIC PANEL
BUN/Creatinine Ratio: 24 (ref 10–24)
BUN: 27 mg/dL (ref 8–27)
CALCIUM: 10 mg/dL (ref 8.6–10.2)
CHLORIDE: 99 mmol/L (ref 96–106)
CO2: 25 mmol/L (ref 20–29)
Creatinine, Ser: 1.14 mg/dL (ref 0.76–1.27)
GFR calc Af Amer: 70 mL/min/{1.73_m2} (ref >59)
GFR calc non Af Amer: 61 mL/min/{1.73_m2} (ref >59)
Glucose: 97 mg/dL (ref 65–99)
Potassium: 4.9 mmol/L (ref 3.5–5.2)
SODIUM: 139 mmol/L (ref 134–144)

## 2017-04-13 LAB — HEPATIC FUNCTION PANEL
ALBUMIN: 4.3 g/dL (ref 3.5–4.8)
ALK PHOS: 66 IU/L (ref 39–117)
ALT: 25 IU/L (ref 0–44)
AST: 29 IU/L (ref 0–40)
BILIRUBIN TOTAL: 0.6 mg/dL (ref 0.0–1.2)
Bilirubin, Direct: 0.17 mg/dL (ref 0.00–0.40)
Total Protein: 7.1 g/dL (ref 6.0–8.5)

## 2017-04-13 LAB — LIPID PANEL
Chol/HDL Ratio: 3.6 ratio (ref 0.0–5.0)
Cholesterol, Total: 154 mg/dL (ref 100–199)
HDL: 43 mg/dL (ref >39)
LDL Calculated: 89 mg/dL (ref 0–99)
TRIGLYCERIDES: 108 mg/dL (ref 0–149)
VLDL CHOLESTEROL CAL: 22 mg/dL (ref 5–40)

## 2017-04-13 LAB — AMIODARONE LEVEL
AMIODARONE LVL: NOT DETECTED ug/mL (ref 1.0–2.5)
Amiodarone, Serum: NOT DETECTED ug/mL (ref 1.0–2.5)
N-DESETHYL-AMIODARONE: NOT DETECTED ug/mL (ref 1.0–2.5)
Noramiodarone,S: NOT DETECTED ug/mL (ref 1.0–2.5)

## 2017-04-13 MED ORDER — ROSUVASTATIN CALCIUM 40 MG PO TABS: 40.0000 mg | ORAL_TABLET | Freq: Every day | ORAL | 3 refills | Status: DC

## 2017-04-13 NOTE — Telephone Encounter (Signed)
Informed patient of results and verbal understanding expressed.  Instructed patient to INCREASE CRESTOR to 40 mg daily. FLP and ALT scheduled 10/29. Patient agrees with treatment plan.

## 2017-04-13 NOTE — Telephone Encounter (Signed)
-----   Message from Sueanne Margarita, MD sent at 04/13/2017  9:17 AM EDT ----- LDL not at goal - increase Crestor to 40mg  daily and repeat FLP and ALT in 6 weeks

## 2017-04-15 DIAGNOSIS — I10 Essential (primary) hypertension: Secondary | ICD-10-CM | POA: Diagnosis not present

## 2017-04-15 DIAGNOSIS — I509 Heart failure, unspecified: Secondary | ICD-10-CM | POA: Diagnosis not present

## 2017-04-15 DIAGNOSIS — I480 Paroxysmal atrial fibrillation: Secondary | ICD-10-CM | POA: Diagnosis not present

## 2017-04-21 DIAGNOSIS — H1045 Other chronic allergic conjunctivitis: Secondary | ICD-10-CM | POA: Diagnosis not present

## 2017-04-21 DIAGNOSIS — H1132 Conjunctival hemorrhage, left eye: Secondary | ICD-10-CM | POA: Diagnosis not present

## 2017-05-19 DIAGNOSIS — Z23 Encounter for immunization: Secondary | ICD-10-CM | POA: Diagnosis not present

## 2017-05-27 DIAGNOSIS — H1851 Endothelial corneal dystrophy: Secondary | ICD-10-CM | POA: Diagnosis not present

## 2017-06-02 DIAGNOSIS — Z471 Aftercare following joint replacement surgery: Secondary | ICD-10-CM | POA: Diagnosis not present

## 2017-06-02 DIAGNOSIS — Z96651 Presence of right artificial knee joint: Secondary | ICD-10-CM | POA: Diagnosis not present

## 2017-06-02 DIAGNOSIS — M170 Bilateral primary osteoarthritis of knee: Secondary | ICD-10-CM | POA: Diagnosis not present

## 2017-06-13 ENCOUNTER — Other Ambulatory Visit: Payer: PPO

## 2017-06-13 DIAGNOSIS — E785 Hyperlipidemia, unspecified: Secondary | ICD-10-CM | POA: Diagnosis not present

## 2017-06-13 LAB — LIPID PANEL
CHOLESTEROL TOTAL: 136 mg/dL (ref 100–199)
Chol/HDL Ratio: 3.2 ratio (ref 0.0–5.0)
HDL: 43 mg/dL (ref >39)
LDL CALC: 72 mg/dL (ref 0–99)
TRIGLYCERIDES: 103 mg/dL (ref 0–149)
VLDL CHOLESTEROL CAL: 21 mg/dL (ref 5–40)

## 2017-06-13 LAB — ALT: ALT: 25 IU/L (ref 0–44)

## 2017-06-20 ENCOUNTER — Other Ambulatory Visit: Payer: Self-pay | Admitting: Cardiology

## 2017-06-20 DIAGNOSIS — I4819 Other persistent atrial fibrillation: Secondary | ICD-10-CM

## 2017-06-27 ENCOUNTER — Other Ambulatory Visit: Payer: Self-pay | Admitting: Cardiology

## 2017-08-26 ENCOUNTER — Telehealth: Payer: Self-pay | Admitting: Cardiology

## 2017-08-26 NOTE — Telephone Encounter (Signed)
Pt c/o Shortness Of Breath: STAT if SOB developed within the last 24 hours or pt is noticeably SOB on the phone  1. Are you currently SOB (can you hear that pt is SOB on the phone)? no 2. How long have you been experiencing SOB? 2 weeks   3. Are you SOB when sitting or when up moving around? Moving around   4. Are you currently experiencing any other symptoms? A-fib

## 2017-08-26 NOTE — Telephone Encounter (Signed)
Left message to call back  

## 2017-08-29 ENCOUNTER — Telehealth: Payer: Self-pay | Admitting: Cardiology

## 2017-08-29 NOTE — Telephone Encounter (Signed)
Returned call to patient.   See previous telephone note

## 2017-08-29 NOTE — Telephone Encounter (Signed)
New message   Pt c/o Shortness Of Breath: STAT if SOB developed within the last 24 hours or pt is noticeably SOB on the phone  1. Are you currently SOB (can you hear that pt is SOB on the phone)?NO  2. How long have you been experiencing SOB? 2 weeks  3. Are you SOB when sitting or when up moving around? Moving around  4. Are you currently experiencing any other symptoms? Pressure in neck

## 2017-08-29 NOTE — Telephone Encounter (Signed)
F/U Patient returning  Your call.

## 2017-08-29 NOTE — Telephone Encounter (Signed)
Returned call to patient. He states for the past week he's been having increased sob with going up stairs and out shopping. Patient also feels that he is in afib. HR is in the 80s. Patient refused to be seen at the afib clinic due to the cost. Patient denies chest pain, swelling, cough, dizziness, or syncope. Patient is scheduled with Ermalinda Barrios, PA on 09/20/17 first available spot. Informed patient I would send to Dr. Radford Pax for further recommendations. Patient verbalized understanding and thanked me for the call.

## 2017-08-30 NOTE — Telephone Encounter (Signed)
Please work in earlier with Lyda Jester, [place] on 1/17

## 2017-08-30 NOTE — Telephone Encounter (Signed)
Returned call to patient. Per Dr. Radford Pax schedule patient with Ellen Henri, [place] on 09/01/17. Patient in agreement with plan, patient scheduled. Patient verbalized understanding and thanked me for the call

## 2017-09-01 ENCOUNTER — Encounter: Payer: Self-pay | Admitting: Cardiology

## 2017-09-01 ENCOUNTER — Ambulatory Visit: Payer: PPO | Admitting: Cardiology

## 2017-09-01 VITALS — BP 128/78 | HR 88 | Ht 71.0 in | Wt 247.8 lb

## 2017-09-01 DIAGNOSIS — I4892 Unspecified atrial flutter: Secondary | ICD-10-CM | POA: Diagnosis not present

## 2017-09-01 DIAGNOSIS — I481 Persistent atrial fibrillation: Secondary | ICD-10-CM

## 2017-09-01 DIAGNOSIS — I4819 Other persistent atrial fibrillation: Secondary | ICD-10-CM

## 2017-09-01 MED ORDER — METOPROLOL SUCCINATE ER 50 MG PO TB24: 50.0000 mg | ORAL_TABLET | Freq: Every day | ORAL | 3 refills | Status: DC

## 2017-09-01 NOTE — Patient Instructions (Addendum)
Medication Instructions:  Your physician has recommended you make the following change in your medication:  1.  INCREASE the Metoprolol to 50 mg daily    Labwork: TODAY:  CBC, BMET, & TSH  Testing/Procedures: None ordered  Follow-Up: Your physician recommends that you schedule a follow-up appointment in: [place].  Your physician wants you to follow-up in: 6 MONTHS WITH DR. Mallie Snooks will receive a reminder letter in the mail two months in advance. If you don't receive a letter, please call our office to schedule the follow-up appointment.  Any Other Special Instructions Will Be Listed Below (If Applicable).     If you need a refill on your cardiac medications before your next appointment, please call your pharmacy.

## 2017-09-01 NOTE — Progress Notes (Signed)
09/01/2017 [name]   1935-06-06  [id number]  Primary Physician Lajean Manes, MD Primary Cardiologist: Dr. Radford Pax   Reason for Visit/CC: Exertional Dyspnea and Fatigue, Recurrent Atrial Fib/flutter   HPI:  [name] is a 83 y.o. male, followed by Dr. Radford Pax, with a hx of PAF s/p DCCV 10/2014, dilated aortic root at (4.4 cm) and ascending aorta (4.0 cm) on most recent assessment. He has h/o systolic HF, however his most recent echo showed normalization of EF at 50-55% (previously 40- 45%). He has also ASCAD by cath with30% LM at the ostium, 70-80% ostial D2, 30-40% prox LCX, 30-40% prox and 40% mid RCA and is on medical management.He has mild OSA and is on CPAP therapy and reports full compliance. He is on chronic anticoagulation therapy with Eliquis. He was previously on amiodarone for his afib, however this was discontinued 10/2016 due to intolerances (visual disturbances and bilateral hand numbness). He was seen in the afib clinic with plans to start Tikosyn after amiodarone washout. However at the time, pt stated he would like to hold off on Tikosyn, as he was w/o symptoms and in SR.  Pt now presents back to general cardiology clinic given recent recurrence of symptoms. He feels that he is back in afib. He is symptomatic with exertional dyspnea and fatigue. Symptoms started ~3 weeks ago. He has noticed that is resting HR at home has increased from previous baseline in the 60s to the 80s. He denies any resting symptoms. No CP. No triggers that he can identify, including no excess caffeine, ETOH, n/v/d, fever or cough. He has been fully compliant with CPAP.   EKG today shows Atrial flutter with CVR in the 80s. BP is 128/78. Asymptomatic at rest. He notes that he continues to have bilateral hand numbness, despite discontinuation of amiodarone 10/2016 and undetectable amio levels 03/2017. His PCP has diagnosed him with carpal tunnel . He does however note that his visual disturbances  (halos) have resolved and he wishes not to go back on Amiodarone for this reason. He is open to reconsider Tikosyn.    Current Meds  Medication Sig  . acetaminophen (TYLENOL) 650 MG CR tablet Take 650 mg by mouth every 8 (eight) hours as needed for pain.  Marland Kitchen apixaban (ELIQUIS) 5 MG TABS tablet Take 1 tablet (5 mg total) by mouth 2 (two) times daily.  . furosemide (LASIX) 20 MG tablet Take 1 tablet (20 mg total) by mouth daily. May take an additional 20 mg by mouth daily for swelling or weight gain of 3 pounds.  . loratadine (CLARITIN) 10 MG tablet Take 10 mg by mouth daily as needed for allergies.  Marland Kitchen losartan (COZAAR) 25 MG tablet TAKE ONE TABLET BY MOUTH DAILY  . meloxicam (MOBIC) 15 MG tablet Take 15 mg by mouth daily.   . MULTIPLE VITAMINS PO Take 1 tablet by mouth daily.  Marland Kitchen omeprazole (PRILOSEC) 20 MG capsule Take 20 mg by mouth daily.   . polyethylene glycol (MIRALAX / GLYCOLAX) packet Take 17 g by mouth daily as needed (constipation).   . rosuvastatin (CRESTOR) 40 MG tablet Take 1 tablet (40 mg total) by mouth daily.  . [DISCONTINUED] metoprolol succinate (TOPROL-XL) 25 MG 24 hr tablet TAKE ONE TABLET BY MOUTH DAILY   Allergies  Allergen Reactions  . Simvastatin Other (See Comments)    Muscle pain elevated CPK  . Codeine Phosphate Other (See Comments)    constipation  . Penicillins Rash    NOT AN ALLERGY  PER PT, thinks the needle used dirty   Past Medical History:  Diagnosis Date  . Back pain   . Benign essential HTN 09/17/2014  . CAD (coronary artery disease), native coronary artery 09/20/2014   30% LM at the ostium, 70-80% ostial D2, 30-40% prox LCX, 30-40% prox and 40% mid RCA and EF 40%.    . DCM (dilated cardiomyopathy) ([place]) 09/17/2014   a.  EF 40%; b. Echo 5/16:  EF 40-45%, diff HK, inf AK, mild AI, MAC, severe LAE  . Dilated aortic root ([place]) 09/17/2014   normal diameter on echo 2017  . DJD (degenerative joint disease)    feet, knees and back  . GERD (gastroesophageal  reflux disease)    hiatal hernia  . Hyperlipidemia LDL goal <70 10/14/2015  . Lesion of left femoral nerve    3 cm MRI 04/2010 consistent with hemangioma  . Mild aortic insufficiency   . Obesity   . OSA (obstructive sleep apnea) 10/14/2015   Mild with AHI 10.6/hr overall and 38/hr during REM sleep.  On CPAP at 9cm H2O  . PAF (paroxysmal atrial fibrillation) ([place]) 09/17/2014  . PSA (psoriatic arthritis) (HCC)    Mild increase 3.65 on 05/2011 stable 3.68 on 09/2011  . Renal cyst, left 2010   5 cm seen on ultrasound    Family History  Problem Relation Age of Onset  . Cancer Mother   . Diabetes Father   . Heart disease Sister   . Atrial fibrillation Sister   . Heart attack Maternal Grandmother   . Stroke Maternal Grandfather   . Heart attack Paternal Grandmother   . Heart attack Paternal Grandfather    Past Surgical History:  Procedure Laterality Date  . CARDIOVERSION N/A 11/04/2014   Procedure: CARDIOVERSION;  Surgeon: Sueanne Margarita, MD;  Location: [facility];  Service: Cardiovascular;  Laterality: N/A;  . CARDIOVERSION N/A 01/09/2015   Procedure: CARDIOVERSION;  Surgeon: Pixie Casino, MD;  Location: [facility];  Service: Cardiovascular;  Laterality: N/A;  . LEFT HEART CATHETERIZATION WITH CORONARY ANGIOGRAM N/A 10/01/2014   Procedure: LEFT HEART CATHETERIZATION WITH CORONARY ANGIOGRAM;  Surgeon: Troy Sine, MD;  Location: [facility];  Service: Cardiovascular;  Laterality: N/A;   Social History   Socioeconomic History  . Marital status: Married    Spouse name: Not on file  . Number of children: 4  . Years of education: Not on file  . Highest education level: Not on file  Social Needs  . Financial resource strain: Not on file  . Food insecurity - worry: Not on file  . Food insecurity - inability: Not on file  . Transportation needs - medical: Not on file  . Transportation needs - non-medical: Not on file  Occupational History  . Occupation: Naval architect    Tobacco Use  . Smoking status: Former Research scientist (life sciences)  . Smokeless tobacco: Never Used  Substance and Sexual Activity  . Alcohol use: No  . Drug use: No  . Sexual activity: Yes  Other Topics Concern  . Not on file  Social History Narrative  . Not on file     Review of Systems: General: negative for chills, fever, night sweats or weight changes.  Cardiovascular: negative for chest pain, dyspnea on exertion, edema, orthopnea, palpitations, paroxysmal nocturnal dyspnea or shortness of breath Dermatological: negative for rash Respiratory: negative for cough or wheezing Urologic: negative for hematuria Abdominal: negative for nausea, vomiting, diarrhea, bright red blood per rectum, melena, or hematemesis Neurologic: negative for  visual changes, syncope, or dizziness All other systems reviewed and are otherwise negative except as noted above.   Physical Exam:  Blood pressure 128/78, pulse 88, height _0  (1.803 m), weight 247 lb 12.8 oz (112.4 kg), SpO2 98 %.  General appearance: alert, cooperative and no distress, obese  Neck: no carotid bruit and no JVD Lungs: clear to auscultation bilaterally Heart: irregularly irregular rhythm, regular resting rate Extremities: extremities normal, atraumatic, no cyanosis or edema Pulses: 2+ and symmetric Skin: Skin color, texture, turgor normal. No rashes or lesions Neurologic: Grossly normal  EKG atrial flutter CVR -- personally reviewed   ASSESSMENT AND PLAN:   1. Atrial Flutter/Fib: Pt with h/o PAF, previously on amiodarone but discontinued due to intolerances. Initial plan was for transition to Tikosyn after amio washout period, however pt declined as he was doing well w/o symptoms. Now with recurrent afib/ flutter and symptomatic with exertion only, along with increase in resting HR from usual baseline. We will check basic labs today: CBC, BMP and TSH. We revisited the idea of Tikosyn and he is interested and agreeable to following up in the afib  clinic to further discuss. In the meantime, we will increase metoprolol from 25 to 50 mg daily. He has enough BP room to support this. Pt will monitor BP at home and will notify us if any issues. If development of soft BP, we can reduce Losartan to 12.5 mg. He will f/u in afib clinic in 2 weeks for repeat assessment. If no significant improvement in symptoms with improved HR, then he will consider initiation of Tikosyn.   2. CAD: mild-moderate ASCAD on previous cath as outlined in HPI. He denies CP. He has exertional dyspnea but in the setting of recurrent afib. Continue medical therapy.   3. Dilated Aortic Root: stable on recent echo (4.4 cm) and ascending aorta (4.0 cm). Continue to follow yearly. BP is controlled. We are increasing BB dose.   4. OSA: compliant with CPAP.   5. Diastolic Dysfunction: T6YB on echo in 2018. Volume is stable. Continue Lasix. BMP today.    Follow-Up in afib clinic in 2 weeks. Dr. Radford Pax in 6 months.   Damond Borchers Ladoris Gene, MHS [facility] 09/01/2017 12:23 PM

## 2017-09-02 LAB — BASIC METABOLIC PANEL
BUN / CREAT RATIO: 22 (ref 10–24)
BUN: 28 mg/dL — ABNORMAL HIGH (ref 8–27)
CHLORIDE: 100 mmol/L (ref 96–106)
CO2: 19 mmol/L — ABNORMAL LOW (ref 20–29)
Calcium: 9.7 mg/dL (ref 8.6–10.2)
Creatinine, Ser: 1.27 mg/dL (ref 0.76–1.27)
GFR calc Af Amer: 61 mL/min/{1.73_m2} (ref >59)
GFR calc non Af Amer: 53 mL/min/{1.73_m2} — ABNORMAL LOW (ref >59)
GLUCOSE: 94 mg/dL (ref 65–99)
POTASSIUM: 5.2 mmol/L (ref 3.5–5.2)
SODIUM: 142 mmol/L (ref 134–144)

## 2017-09-02 LAB — CBC
Hematocrit: 45.7 % (ref 37.5–51.0)
Hemoglobin: 15.1 g/dL (ref 13.0–17.7)
MCH: 27.5 pg (ref 26.6–33.0)
MCHC: 33 g/dL (ref 31.5–35.7)
MCV: 83 fL (ref 79–97)
Platelets: 199 10*3/uL (ref 150–379)
RBC: 5.5 x10E6/uL (ref 4.14–5.80)
RDW: 14.1 % (ref 12.3–15.4)
WBC: 8.1 10*3/uL (ref 3.4–10.8)

## 2017-09-02 LAB — TSH: TSH: 1.81 u[IU]/mL (ref 0.450–4.500)

## 2017-09-12 DIAGNOSIS — I480 Paroxysmal atrial fibrillation: Secondary | ICD-10-CM | POA: Diagnosis not present

## 2017-09-12 DIAGNOSIS — Z79899 Other long term (current) drug therapy: Secondary | ICD-10-CM | POA: Diagnosis not present

## 2017-09-12 DIAGNOSIS — Z23 Encounter for immunization: Secondary | ICD-10-CM | POA: Diagnosis not present

## 2017-09-12 DIAGNOSIS — I10 Essential (primary) hypertension: Secondary | ICD-10-CM | POA: Diagnosis not present

## 2017-09-12 DIAGNOSIS — Z6834 Body mass index (BMI) 34.0-34.9, adult: Secondary | ICD-10-CM | POA: Diagnosis not present

## 2017-09-12 DIAGNOSIS — K219 Gastro-esophageal reflux disease without esophagitis: Secondary | ICD-10-CM | POA: Diagnosis not present

## 2017-09-12 DIAGNOSIS — G5603 Carpal tunnel syndrome, bilateral upper limbs: Secondary | ICD-10-CM | POA: Diagnosis not present

## 2017-09-12 DIAGNOSIS — Z1389 Encounter for screening for other disorder: Secondary | ICD-10-CM | POA: Diagnosis not present

## 2017-09-12 DIAGNOSIS — E7800 Pure hypercholesterolemia, unspecified: Secondary | ICD-10-CM | POA: Diagnosis not present

## 2017-09-12 DIAGNOSIS — E669 Obesity, unspecified: Secondary | ICD-10-CM | POA: Diagnosis not present

## 2017-09-12 DIAGNOSIS — I509 Heart failure, unspecified: Secondary | ICD-10-CM | POA: Diagnosis not present

## 2017-09-12 DIAGNOSIS — Z0000 Encounter for general adult medical examination without abnormal findings: Secondary | ICD-10-CM | POA: Diagnosis not present

## 2017-09-15 ENCOUNTER — Ambulatory Visit (HOSPITAL_COMMUNITY)
Admission: RE | Admit: 2017-09-15 | Discharge: 2017-09-15 | Disposition: A | Discharge status: Home / Self Care | Payer: PPO | Source: Ambulatory Visit | Attending: Nurse Practitioner | Admitting: Nurse Practitioner

## 2017-09-15 ENCOUNTER — Encounter (HOSPITAL_COMMUNITY): Payer: Self-pay | Admitting: Nurse Practitioner

## 2017-09-15 VITALS — BP 130/66 | HR 80 | Ht 71.0 in | Wt 249.0 lb

## 2017-09-15 DIAGNOSIS — Z885 Allergy status to narcotic agent status: Secondary | ICD-10-CM | POA: Diagnosis not present

## 2017-09-15 DIAGNOSIS — G4733 Obstructive sleep apnea (adult) (pediatric): Secondary | ICD-10-CM | POA: Diagnosis not present

## 2017-09-15 DIAGNOSIS — Z880 Allergy status to penicillin: Secondary | ICD-10-CM | POA: Insufficient documentation

## 2017-09-15 DIAGNOSIS — I480 Paroxysmal atrial fibrillation: Secondary | ICD-10-CM | POA: Insufficient documentation

## 2017-09-15 DIAGNOSIS — I10 Essential (primary) hypertension: Secondary | ICD-10-CM | POA: Insufficient documentation

## 2017-09-15 DIAGNOSIS — I4819 Other persistent atrial fibrillation: Secondary | ICD-10-CM

## 2017-09-15 DIAGNOSIS — M1990 Unspecified osteoarthritis, unspecified site: Secondary | ICD-10-CM | POA: Insufficient documentation

## 2017-09-15 DIAGNOSIS — I2510 Atherosclerotic heart disease of native coronary artery without angina pectoris: Secondary | ICD-10-CM | POA: Diagnosis not present

## 2017-09-15 DIAGNOSIS — E785 Hyperlipidemia, unspecified: Secondary | ICD-10-CM | POA: Diagnosis not present

## 2017-09-15 DIAGNOSIS — Z791 Long term (current) use of non-steroidal anti-inflammatories (NSAID): Secondary | ICD-10-CM | POA: Diagnosis not present

## 2017-09-15 DIAGNOSIS — I351 Nonrheumatic aortic (valve) insufficiency: Secondary | ICD-10-CM | POA: Insufficient documentation

## 2017-09-15 DIAGNOSIS — Z9889 Other specified postprocedural states: Secondary | ICD-10-CM | POA: Insufficient documentation

## 2017-09-15 DIAGNOSIS — R200 Anesthesia of skin: Secondary | ICD-10-CM | POA: Insufficient documentation

## 2017-09-15 DIAGNOSIS — K219 Gastro-esophageal reflux disease without esophagitis: Secondary | ICD-10-CM | POA: Diagnosis not present

## 2017-09-15 DIAGNOSIS — I481 Persistent atrial fibrillation: Secondary | ICD-10-CM | POA: Diagnosis not present

## 2017-09-15 DIAGNOSIS — Z823 Family history of stroke: Secondary | ICD-10-CM | POA: Diagnosis not present

## 2017-09-15 DIAGNOSIS — Z8249 Family history of ischemic heart disease and other diseases of the circulatory system: Secondary | ICD-10-CM | POA: Insufficient documentation

## 2017-09-15 DIAGNOSIS — K449 Diaphragmatic hernia without obstruction or gangrene: Secondary | ICD-10-CM | POA: Diagnosis not present

## 2017-09-15 DIAGNOSIS — Z888 Allergy status to other drugs, medicaments and biological substances status: Secondary | ICD-10-CM | POA: Insufficient documentation

## 2017-09-15 DIAGNOSIS — Z7901 Long term (current) use of anticoagulants: Secondary | ICD-10-CM | POA: Diagnosis not present

## 2017-09-15 DIAGNOSIS — Z809 Family history of malignant neoplasm, unspecified: Secondary | ICD-10-CM | POA: Insufficient documentation

## 2017-09-15 DIAGNOSIS — E669 Obesity, unspecified: Secondary | ICD-10-CM | POA: Diagnosis not present

## 2017-09-15 DIAGNOSIS — Z833 Family history of diabetes mellitus: Secondary | ICD-10-CM | POA: Insufficient documentation

## 2017-09-15 DIAGNOSIS — Z79899 Other long term (current) drug therapy: Secondary | ICD-10-CM | POA: Diagnosis not present

## 2017-09-15 DIAGNOSIS — Z87891 Personal history of nicotine dependence: Secondary | ICD-10-CM | POA: Diagnosis not present

## 2017-09-15 DIAGNOSIS — I420 Dilated cardiomyopathy: Secondary | ICD-10-CM | POA: Insufficient documentation

## 2017-09-15 NOTE — Progress Notes (Signed)
Primary Care Physician: Lajean Manes, MD Referring Physician/Cardiologsit: Dr. Marden Noble is a 83 y.o. male with a h/o CAD, OAS with cpap, obesity,and atrial fibrillation on amiodarone x 2 years. He was referred to the afib clinic for other antiarrythmic options due to pt's c/o of numb fingers. States he can't even button his shirts because he is unable to feel his buttons. Echo in 2016 showed decreased EF at 40-45% and left atrial size of 60 mm.  Returned to [place] clinic, 12/16/16, now one month off amiodarone, staying in [place]. Has noticed a small amount of improvement of numbness of fingers in one hand. He is staying in [place]. Amiodarone level to be done today. Repeat echo show improvement in EF to normal range and left atrial size reduced in size from 60 mm to 52 mm. He states that as long as he is in [place], he would like to remain off AAD, he was reminded that there is no guarantee how long this honeymoon may last.  Return to [facility], 09/15/17, pt has returned to afib, having a nice run in [place] off amiodarone but symptoms returned the first of there year. Finger numbness did not resolve. He is here to discus use of Tikosyn or sotalol to resume SR. However, he is slow in SR and may preclude use of sotalol. He is concerned re the cost of Tikosyn. He feels his quality of life is not suffering at this point and is not sure he wants to do want is required to start Tikosyn and is worried that he may not be compliant with the timing of Tikosyn. He is currently limited in his activities with arthritis and walks with a can and the only time he notices shortness of breath is if he has to go up steps. He is rate controlled and remains on Eliquis.  Today, he denies symptoms of palpitations, chest pain, shortness of breath, orthopnea, PND, lower extremity edema, dizziness, presyncope, syncope, or neurologic sequela. The patient is tolerating medications without difficulties and is otherwise without  complaint today.   Past Medical History:  Diagnosis Date  . Back pain   . Benign essential HTN 09/17/2014  . CAD (coronary artery disease), native coronary artery 09/20/2014   30% LM at the ostium, 70-80% ostial D2, 30-40% prox LCX, 30-40% prox and 40% mid RCA and EF 40%.    . DCM (dilated cardiomyopathy) ([place]) 09/17/2014   a.  EF 40%; b. Echo 5/16:  EF 40-45%, diff HK, inf AK, mild AI, MAC, severe LAE  . Dilated aortic root ([place]) 09/17/2014   normal diameter on echo 2017  . DJD (degenerative joint disease)    feet, knees and back  . GERD (gastroesophageal reflux disease)    hiatal hernia  . Hyperlipidemia LDL goal <70 10/14/2015  . Lesion of left femoral nerve    3 cm MRI 04/2010 consistent with hemangioma  . Mild aortic insufficiency   . Obesity   . OSA (obstructive sleep apnea) 10/14/2015   Mild with AHI 10.6/hr overall and 38/hr during REM sleep.  On CPAP at 9cm H2O  . PAF (paroxysmal atrial fibrillation) ([place]) 09/17/2014  . PSA (psoriatic arthritis) (HCC)    Mild increase 3.65 on 05/2011 stable 3.68 on 09/2011  . Renal cyst, left 2010   5 cm seen on ultrasound    Past Surgical History:  Procedure Laterality Date  . CARDIOVERSION N/A 11/04/2014   Procedure: CARDIOVERSION;  Surgeon: Sueanne Margarita, MD;  Location: [place];  Service: Cardiovascular;  Laterality: N/A;  . CARDIOVERSION N/A 01/09/2015   Procedure: CARDIOVERSION;  Surgeon: Pixie Casino, MD;  Location: [facility] ENDOSCOPY;  Service: Cardiovascular;  Laterality: N/A;  . LEFT HEART CATHETERIZATION WITH CORONARY ANGIOGRAM N/A 10/01/2014   Procedure: LEFT HEART CATHETERIZATION WITH CORONARY ANGIOGRAM;  Surgeon: Troy Sine, MD;  Location[facility] C[facility] LAB;  Service: Cardiovascular;  Laterality: N/A;    Current Outpatient Medications  Medication Sig Dispense Refill  . acetaminophen (TYLENOL) 650 MG CR tablet Take 650 mg by mouth every 8 (eight) hours as needed for pain.    Marland Kitchen apixaban (ELIQUIS) 5 MG TABS tablet Take 1 tablet (5 mg  total) by mouth 2 (two) times daily. 180 tablet 3  . Calcium Carb-Cholecalciferol 500-400 MG-UNIT TABS Take 1 tablet by mouth daily.    . furosemide (LASIX) 20 MG tablet Take 1 tablet (20 mg total) by mouth daily. May take an additional 20 mg by mouth daily for swelling or weight gain of 3 pounds. 50 tablet 11  . loratadine (CLARITIN) 10 MG tablet Take 10 mg by mouth daily as needed for allergies.    . meloxicam (MOBIC) 15 MG tablet Take 15 mg by mouth daily.     . metoprolol succinate (TOPROL-XL) 50 MG 24 hr tablet Take 1 tablet (50 mg total) by mouth daily. Take with or immediately following a meal. 90 tablet 3  . MULTIPLE VITAMINS PO Take 1 tablet by mouth daily.    Marland Kitchen omeprazole (PRILOSEC) 20 MG capsule Take 20 mg by mouth daily.     . polyethylene glycol (MIRALAX / GLYCOLAX) packet Take 17 g by mouth daily as needed (constipation).     . rosuvastatin (CRESTOR) 40 MG tablet Take 1 tablet (40 mg total) by mouth daily. 90 tablet 3   No current facility-administered medications for this encounter.     Allergies  Allergen Reactions  . Simvastatin Other (See Comments)    Muscle pain elevated CPK  . Codeine Phosphate Other (See Comments)    constipation  . Penicillins Rash    NOT AN ALLERGY PER PT, thinks the needle used dirty    Social History   Socioeconomic History  . Marital status: Married    Spouse name: Not on file  . Number of children: 4  . Years of education: Not on file  . Highest education level: Not on file  Social Needs  . Financial resource strain: Not on file  . Food insecurity - worry: Not on file  . Food insecurity - inability: Not on file  . Transportation needs - medical: Not on file  . Transportation needs - non-medical: Not on file  Occupational History  . Occupation: Naval architect  Tobacco Use  . Smoking status: Former Research scientist (life sciences)  . Smokeless tobacco: Never Used  Substance and Sexual Activity  . Alcohol use: No  . Drug use: No  . Sexual activity: Yes    Other Topics Concern  . Not on file  Social History Narrative  . Not on file    Family History  Problem Relation Age of Onset  . Cancer Mother   . Diabetes Father   . Heart disease Sister   . Atrial fibrillation Sister   . Heart attack Maternal Grandmother   . Stroke Maternal Grandfather   . Heart attack Paternal Grandmother   . Heart attack Paternal Grandfather     ROS- All systems are reviewed and negative except as per the HPI above  Physical Exam: Vitals:   09/15/17 1127  BP: 130/66  Pulse: 80  Weight: 249 lb (112.9 kg)  Height: _0  (1.803 m)   Wt Readings from Last 3 Encounters:  09/15/17 249 lb (112.9 kg)  09/01/17 247 lb 12.8 oz (112.4 kg)  04/12/17 235 lb (106.6 kg)    Labs: Lab Results  Component Value Date   NA 142 09/01/2017   K 5.2 09/01/2017   CL 100 09/01/2017   CO2 19 (L) 09/01/2017   GLUCOSE 94 09/01/2017   BUN 28 (H) 09/01/2017   CREATININE 1.27 09/01/2017   CALCIUM 9.7 09/01/2017   Lab Results  Component Value Date   INR 1.1 (H) 10/31/2014   Lab Results  Component Value Date   CHOL 136 06/13/2017   HDL 43 06/13/2017   LDLCALC 72 06/13/2017   TRIG 103 06/13/2017     GEN- The patient is well appearing, alert and oriented x 3 today.   Head- normocephalic, atraumatic Eyes-  Sclera clear, conjunctiva pink Ears- hearing intact Oropharynx- clear Neck- supple, no JVP Lymph- no cervical lymphadenopathy Lungs- Clear to ausculation bilaterally, normal work of breathing Heart- Regular rate and rhythm, no murmurs, rubs or gallops, PMI not laterally displaced GI- soft, NT, ND, + BS Extremities- no clubbing, cyanosis, or edema MS- no significant deformity or atrophy Skin- no rash or lesion Psych- euthymic mood, full affect Neuro- strength and sensation are intact  EKG- afib at 80 bpm, qrs int 108 ms, qtc 424 ms Echo 2018-Study Conclusions  - Left ventricle: The cavity size was normal. There was moderate   concentric hypertrophy.  Systolic function was normal. The   estimated ejection fraction was in the range of 50% to 55%.   Abnormal GLPSS at -15%. Wall motion was normal; there were no   regional wall motion abnormalities. Doppler parameters are   consistent with abnormal left ventricular relaxation (grade 1   diastolic dysfunction). The E/e&' ratio is between 8-15,   suggesting indeterminate LV filling pressure. - Aortic valve: Trileaflet; mildly calcified leaflets. There was   mild regurgitation. - Aorta: Dilated aortic root (4.4 cm) and ascending aorta (4.0 cm).   Aortic root dimension: 44 mm (ED). - Mitral valve: Mildly thickened leaflets . - Left atrium: The atrium was mildly dilated. - Right atrium: The atrium was mildly dilated. - Tricuspid valve: There was mild regurgitation. - Pulmonary arteries: PA peak pressure: 25 mm Hg (S). - Inferior vena cava: The vessel was normal in size. The   respirophasic diameter changes were in the normal range (>= 50%),   consistent with normal central venous pressure.  Impressions:  - Compared to a prior study in 2016, the LVEF has improved to   50-55%. The aortic root ascending aorta are dilated.  -------------------------------------------------------------------     Assessment and Plan:  1. Persistent afib Pt had been on amiodarone for 2 years and had developed numbness of his fingers, wanted to stop drug for this reason Stopped last Spring and just recently went back into afib He is rate controlled Numbness mildly improved off amiodarone  Tikosyn/sotalol discussed Pt not sure at this point if he wants to pursue rhythm control and or may choose to stay in rate controlled Left atrial size has improved and EF by echo shows improvement, but still left atrium still large for ablation at 52 mm   f/u with Dr. Radford Pax as scheduled in March as scheduled Pt will let me know if he changes his mind re rhythm control  Geroge Baseman Carollee Nussbaumer, [facility] [address] [place], [place] [address] [phone]

## 2017-09-19 ENCOUNTER — Telehealth: Payer: Self-pay

## 2017-09-19 DIAGNOSIS — I714 Abdominal aortic aneurysm, without rupture, unspecified: Secondary | ICD-10-CM

## 2017-09-19 NOTE — Telephone Encounter (Signed)
Per CT department, patient has a scheduled CT but ordered has expired. New order placed.

## 2017-09-20 ENCOUNTER — Ambulatory Visit: Payer: PPO | Admitting: Physician Assistant

## 2017-09-21 DIAGNOSIS — Z961 Presence of intraocular lens: Secondary | ICD-10-CM | POA: Diagnosis not present

## 2017-09-21 DIAGNOSIS — H40003 Preglaucoma, unspecified, bilateral: Secondary | ICD-10-CM | POA: Diagnosis not present

## 2017-09-21 DIAGNOSIS — H1851 Endothelial corneal dystrophy: Secondary | ICD-10-CM | POA: Diagnosis not present

## 2017-09-30 ENCOUNTER — Other Ambulatory Visit: Payer: PPO

## 2017-09-30 ENCOUNTER — Ambulatory Visit (INDEPENDENT_AMBULATORY_CARE_PROVIDER_SITE_OTHER)
Admission: RE | Admit: 2017-09-30 | Discharge: 2017-09-30 | Disposition: A | Discharge status: Home / Self Care | Payer: PPO | Source: Ambulatory Visit | Attending: Cardiology | Admitting: Cardiology

## 2017-09-30 DIAGNOSIS — I714 Abdominal aortic aneurysm, without rupture, unspecified: Secondary | ICD-10-CM

## 2017-09-30 DIAGNOSIS — I712 Thoracic aortic aneurysm, without rupture: Secondary | ICD-10-CM | POA: Diagnosis not present

## 2017-09-30 MED ORDER — IOPAMIDOL (ISOVUE-370) INJECTION 76%: 100.0000 mL | Freq: Once | INTRAVENOUS | Status: DC | PRN

## 2017-10-03 ENCOUNTER — Telehealth: Payer: Self-pay

## 2017-10-03 DIAGNOSIS — I77810 Thoracic aortic ectasia: Secondary | ICD-10-CM

## 2017-10-03 NOTE — Telephone Encounter (Signed)
Notes recorded by Teressa Senter, RN on 10/03/2017 at 12:28 PM EST Patient made aware of normal lab results. Patient verbalized understanding and thankful for the the call. Echo ordered to be scheduled in a year.    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

## 2017-10-19 DIAGNOSIS — H40003 Preglaucoma, unspecified, bilateral: Secondary | ICD-10-CM | POA: Diagnosis not present

## 2017-10-19 DIAGNOSIS — Z961 Presence of intraocular lens: Secondary | ICD-10-CM | POA: Diagnosis not present

## 2017-10-19 DIAGNOSIS — H1851 Endothelial corneal dystrophy: Secondary | ICD-10-CM | POA: Diagnosis not present

## 2017-10-20 ENCOUNTER — Ambulatory Visit: Payer: PPO | Admitting: Cardiology

## 2017-10-20 ENCOUNTER — Ambulatory Visit (HOSPITAL_COMMUNITY): Payer: PPO | Attending: Cardiology

## 2017-10-20 ENCOUNTER — Other Ambulatory Visit: Payer: Self-pay

## 2017-10-20 ENCOUNTER — Telehealth: Payer: Self-pay | Admitting: *Deleted

## 2017-10-20 ENCOUNTER — Encounter: Payer: Self-pay | Admitting: Cardiology

## 2017-10-20 VITALS — BP 120/80 | HR 49 | Ht 71.0 in | Wt 247.0 lb

## 2017-10-20 DIAGNOSIS — I420 Dilated cardiomyopathy: Secondary | ICD-10-CM | POA: Insufficient documentation

## 2017-10-20 DIAGNOSIS — E785 Hyperlipidemia, unspecified: Secondary | ICD-10-CM | POA: Insufficient documentation

## 2017-10-20 DIAGNOSIS — R0602 Shortness of breath: Secondary | ICD-10-CM

## 2017-10-20 DIAGNOSIS — I480 Paroxysmal atrial fibrillation: Secondary | ICD-10-CM | POA: Insufficient documentation

## 2017-10-20 DIAGNOSIS — I5042 Chronic combined systolic (congestive) and diastolic (congestive) heart failure: Secondary | ICD-10-CM

## 2017-10-20 DIAGNOSIS — I083 Combined rheumatic disorders of mitral, aortic and tricuspid valves: Secondary | ICD-10-CM | POA: Insufficient documentation

## 2017-10-20 DIAGNOSIS — I10 Essential (primary) hypertension: Secondary | ICD-10-CM

## 2017-10-20 DIAGNOSIS — I4819 Other persistent atrial fibrillation: Secondary | ICD-10-CM

## 2017-10-20 DIAGNOSIS — G4733 Obstructive sleep apnea (adult) (pediatric): Secondary | ICD-10-CM

## 2017-10-20 DIAGNOSIS — I2510 Atherosclerotic heart disease of native coronary artery without angina pectoris: Secondary | ICD-10-CM | POA: Diagnosis not present

## 2017-10-20 DIAGNOSIS — I77810 Thoracic aortic ectasia: Secondary | ICD-10-CM | POA: Diagnosis not present

## 2017-10-20 DIAGNOSIS — I481 Persistent atrial fibrillation: Secondary | ICD-10-CM

## 2017-10-20 DIAGNOSIS — I119 Hypertensive heart disease without heart failure: Secondary | ICD-10-CM | POA: Diagnosis not present

## 2017-10-20 LAB — ECHOCARDIOGRAM COMPLETE
Height: 71 in
Weight: 3952 oz

## 2017-10-20 NOTE — Progress Notes (Signed)
Cardiology Office Note:    Date:  10/20/2017   ID:  ORA MCNATT, DOB 1934-10-11, MRN [id number]  PCP:  Lajean Manes, MD  Cardiologist:  No primary care provider on file.    Referring MD: Lajean Manes, MD   Chief Complaint  Patient presents with  . Atrial Fibrillation  . Congestive Heart Failure  . Coronary Artery Disease  . Hyperlipidemia    History of Present Illness:    AARIK Snow is a 83 y.o. male with a hx of  PAF s/p DCCV 10/2014, dilated aortic root at (4.4 cm) and ascending aorta (4.0 cm) on most recent assessment. He has h/o systolic HF, however his most recent echo showed normalization of EF at 50-55% (previously 40- 45%). He has also ASCAD by cath with30% LM at the ostium, 70-80% ostial D2, 30-40% prox LCX, 30-40% prox and 40% mid RCA and is on medical management.  He has mild OSA and is on CPAP therapy and reports full compliance. He is on chronic anticoagulation therapy with Eliquis. He was previously on amiodarone for his afib, however this was discontinued 10/2016 due to intolerances (visual disturbances and bilateral hand numbness ). He was seen in the afib clinic with plans to start Tikosyn after amiodarone washout. However at the time, pt stated he would like to hold off on Tikosyn.  He was found to be back in atrial flutter with controlled ventricular response in the office earlier this year.  He was asymptomatic at that time.  He was seen in the atrial fibrillation clinic in January antiarrhythmic drug therapy was discussed with the patient wanted to hold off.  HE is here today for followup and is doing well.  He denies any chest pain or pressure, PND, orthopnea, LE edema, dizziness, palpitations or syncope. He has been having SOB ever since he went back into afib.  He is compliant with his meds and is tolerating meds with no SE.    Past Medical History:  Diagnosis Date  . Back pain   . Benign essential HTN 09/17/2014  . CAD (coronary artery disease), native  coronary artery 09/20/2014   30% LM at the ostium, 70-80% ostial D2, 30-40% prox LCX, 30-40% prox and 40% mid RCA and EF 40%.    . DCM (dilated cardiomyopathy) ([place]) 09/17/2014   a.  EF 40%; b. Echo 5/16:  EF 40-45%, diff HK, inf AK, mild AI, MAC, severe LAE  . Dilated aortic root ([place]) 09/17/2014   normal diameter on echo 2017  . DJD (degenerative joint disease)    feet, knees and back  . GERD (gastroesophageal reflux disease)    hiatal hernia  . Hyperlipidemia LDL goal <70 10/14/2015  . Lesion of left femoral nerve    3 cm MRI 04/2010 consistent with hemangioma  . Mild aortic insufficiency   . Obesity   . OSA (obstructive sleep apnea) 10/14/2015   Mild with AHI 10.6/hr overall and 38/hr during REM sleep.  On CPAP at 9cm H2O  . PAF (paroxysmal atrial fibrillation) ([place]) 09/17/2014  . PSA (psoriatic arthritis) ([facility])    Mild increase 3.65 on 05/2011 stable 3.68 on 09/2011  . Renal cyst, left 2010   5 cm seen on ultrasound     Past Surgical History:  Procedure Laterality Date  . CARDIOVERSION N/A 11/04/2014   Procedure: CARDIOVERSION;  Surgeon: Sueanne Margarita, MD;  Location: [facility] ENDOSCOPY;  Service: Cardiovascular;  Laterality: N/A;  . CARDIOVERSION N/A 01/09/2015   Procedure: CARDIOVERSION;  Surgeon:  Pixie Casino, MD;  Location: [facility] ENDOSCOPY;  Service: Cardiovascular;  Laterality: N/A;  . LEFT HEART CATHETERIZATION WITH CORONARY ANGIOGRAM N/A 10/01/2014   Procedure: LEFT HEART CATHETERIZATION WITH CORONARY ANGIOGRAM;  Surgeon: Troy Sine, MD;  Location: [facility] CATH LAB;  Service: Cardiovascular;  Laterality: N/A;    Current Medications: Current Meds  Medication Sig  . acetaminophen (TYLENOL) 650 MG CR tablet Take 650 mg by mouth every 8 (eight) hours as needed for pain.  Marland Kitchen apixaban (ELIQUIS) 5 MG TABS tablet Take 1 tablet (5 mg total) by mouth 2 (two) times daily.  . Calcium Carb-Cholecalciferol 500-400 MG-UNIT TABS Take 1 tablet by mouth daily.  . furosemide (LASIX) 20 MG tablet Take 1  tablet (20 mg total) by mouth daily. May take an additional 20 mg by mouth daily for swelling or weight gain of 3 pounds.  . loratadine (CLARITIN) 10 MG tablet Take 10 mg by mouth daily as needed for allergies.  . meloxicam (MOBIC) 15 MG tablet Take 15 mg by mouth daily.   . metoprolol succinate (TOPROL-XL) 50 MG 24 hr tablet Take 1 tablet (50 mg total) by mouth daily. Take with or immediately following a meal.  . MULTIPLE VITAMINS PO Take 1 tablet by mouth daily.  Marland Kitchen omeprazole (PRILOSEC) 20 MG capsule Take 20 mg by mouth daily.   . polyethylene glycol (MIRALAX / GLYCOLAX) packet Take 17 g by mouth daily as needed (constipation).   . rosuvastatin (CRESTOR) 40 MG tablet Take 1 tablet (40 mg total) by mouth daily.     Allergies:   Simvastatin; Codeine phosphate; and Penicillins   Social History   Socioeconomic History  . Marital status: Married    Spouse name: None  . Number of children: 4  . Years of education: None  . Highest education level: None  Social Needs  . Financial resource strain: None  . Food insecurity - worry: None  . Food insecurity - inability: None  . Transportation needs - medical: None  . Transportation needs - non-medical: None  Occupational History  . Occupation: Naval architect  Tobacco Use  . Smoking status: Former Research scientist (life sciences)  . Smokeless tobacco: Never Used  Substance and Sexual Activity  . Alcohol use: No  . Drug use: No  . Sexual activity: Yes  Other Topics Concern  . None  Social History Narrative  . None     Family History: The patient's family history includes Atrial fibrillation in his sister; Cancer in his mother; Diabetes in his father; Heart attack in his maternal grandmother, paternal grandfather, and paternal grandmother; Heart disease in his sister; Stroke in his maternal grandfather.  ROS:   Please see the history of present illness.    ROS  All other systems reviewed and negative.   EKGs/Labs/Other Studies Reviewed:    The following  studies were reviewed today: none  EKG:  EKG is not ordered today.   Recent Labs: 06/13/2017: ALT 25 09/01/2017: BUN 28; Creatinine, Ser 1.27; Hemoglobin 15.1; Platelets 199; Potassium 5.2; Sodium 142; TSH 1.810   Recent Lipid Panel    Component Value Date/Time   CHOL 136 06/13/2017 1146   TRIG 103 06/13/2017 1146   HDL 43 06/13/2017 1146   CHOLHDL 3.2 06/13/2017 1146   CHOLHDL 2.5 05/10/2016 1145   VLDL 19 05/10/2016 1145   LDLCALC 72 06/13/2017 1146    Physical Exam:    VS:  BP 120/80 (BP Location: Left Arm, Patient Position: Sitting, Cuff Size: Normal)   Pulse Marland Kitchen)  49   Ht 5' 11"  (1.803 m)   Wt 247 lb (112 kg)   SpO2 98%   BMI 34.45 kg/m     Wt Readings from Last 3 Encounters:  10/20/17 247 lb (112 kg)  09/15/17 249 lb (112.9 kg)  09/01/17 247 lb 12.8 oz (112.4 kg)     GEN:  Well nourished, well developed in no acute distress HEENT: Normal NECK: No JVD; No carotid bruits LYMPHATICS: No lymphadenopathy CARDIAC: RRR, no murmurs, rubs, gallops RESPIRATORY:  Clear to auscultation without rales, wheezing or rhonchi  ABDOMEN: Soft, non-tender, non-distended MUSCULOSKELETAL:  No edema; No deformity  SKIN: Warm and dry NEUROLOGIC:  Alert and oriented x 3 PSYCHIATRIC:  Normal affect   ASSESSMENT:    1. Persistent atrial fibrillation ([place])   2. Benign essential HTN   3. Dilated aortic root ([place])   4. Coronary artery disease involving native coronary artery of native heart without angina pectoris   5. Chronic combined systolic and diastolic CHF (congestive heart failure) ([place])   6. OSA (obstructive sleep apnea)   7. Hyperlipidemia LDL goal <70   8. SOB (shortness of breath)    PLAN:    In order of problems listed above:  1.  Persistent atrial fibrillation - he remains in atrial fibrillation with CVR but is having continued DOE which started around the time he went back into afib.   He had opted to hold off on any further antiarrhythmic therapy at afib clinic.  He  will continue on Toprol XL 50 mg daily and apixaban 5 mg twice daily. I will check a BMET today.  We discussed AADT and I think, given his symptoms of SOB with afib, that we need to get him back in NSR.  He is willing to go in for Tikosyn load but I want to get his OSA under better control first.  I will adjust CPAP and plan for Tikosyn load the beginning of April with DCCV during that admission.  I will let Roderic Palau, NP know of this plan so she can set up the admission.  2.  Hypertension - blood pressure well controlled on exam today.  He will continue on metoprolol  succinate 50 mg daily.  3.  Dilated aortic root at (4.4 cm) and ascending aorta (4.0 cm) -we will continue to follow with yearly echo.  I will repeat an echo in April 2019.  4.  ASCAD - cath with30% LM at the ostium, 70-80% ostial D2, 30-40% prox LCX, 30-40% prox and 40% mid RCA and is on medical management.  He has not had any anginal symptoms.  He is not on aspirin due to being on NOA continue on Crestor 40 mg daily and Toprol.  5.  Chronic diastolic heart failure -he appears euvolemic on exam today.  His weight is stable.  He will continue on Lasix 20 mg daily on 09/01/2017.  6.  OSA - the patient is tolerating PAP therapy well without any problems. The PAP download was reviewed today and showed an AHI of 8.5/hr on 9 cm H2O with 93% compliance in using more than 4 hours nightly.  The patient has been using and benefiting from PAP use and will continue to benefit from therapy. His AHI I elevated today so I will get a 2 week autotitration from 4 to 20cm H2O.    7.  Hyperlipidemia with LDL goal < 70.  He will continue on Crestor 40 mg daily.  His LDL was at goal at  72 on 06/13/2017.  Creatinine stable at 1.27  8.  SOB - this has occurred since he has been back in atrial fibrillation a few months ago and now cannot lay flat to breath at night because he gets SOB.  This is likely related to afib with loss of atrial kick on top of his  chronic diastolic HF. I will check a BNP today.  I will repeat an echo to make sure his LVF remains normal.    Medication Adjustments/Labs and Tests Ordered: Current medicines are reviewed at length with the patient today.  Concerns regarding medicines are outlined above.  No orders of the defined types were placed in this encounter.  No orders of the defined types were placed in this encounter.   Signed, Fransico Him, MD  10/20/2017 11:45 AM    [place]

## 2017-10-20 NOTE — Patient Instructions (Signed)
Medication Instructions:  Your physician recommends that you continue on your current medications as directed. Please refer to the Current Medication list given to you today.  If you need a refill on your cardiac medications, please contact your pharmacy first.  Labwork: Today for basic metabolic panel, liver function, BNP, and fasting lipids   Testing/Procedures: Your physician has requested that you have an echocardiogram. Echocardiography is a painless test that uses sound waves to create images of your heart. It provides your doctor with information about the size and shape of your heart and how well your heart's chambers and valves are working. This procedure takes approximately one hour. There are no restrictions for this procedure.  Follow-Up: Your physician wants you to follow-up in: 6 month with Dr. Radford Pax. You will receive a reminder letter in the mail two months in advance. If you don't receive a letter, please call our office to schedule the follow-up appointment.  Any Other Special Instructions Will Be Listed Below (If Applicable).   Thank you for choosing [facility], RN  [phone]  If you need a refill on your cardiac medications before your next appointment, please call your pharmacy.

## 2017-10-20 NOTE — Telephone Encounter (Signed)
Sent to [facility] via [employer] today.

## 2017-10-20 NOTE — Telephone Encounter (Signed)
-----   Message from Teressa Senter, RN sent at 10/20/2017  1:29 PM EST ----- Regarding: dme order DME order placed   Thanks Rena

## 2017-10-21 ENCOUNTER — Telehealth: Payer: Self-pay | Admitting: Pharmacist

## 2017-10-21 LAB — LIPID PANEL
Chol/HDL Ratio: 3 ratio (ref 0.0–5.0)
Cholesterol, Total: 149 mg/dL (ref 100–199)
HDL: 49 mg/dL (ref >39)
LDL CALC: 81 mg/dL (ref 0–99)
Triglycerides: 96 mg/dL (ref 0–149)
VLDL CHOLESTEROL CAL: 19 mg/dL (ref 5–40)

## 2017-10-21 LAB — HEPATIC FUNCTION PANEL
ALK PHOS: 66 IU/L (ref 39–117)
ALT: 27 IU/L (ref 0–44)
AST: 34 IU/L (ref 0–40)
Albumin: 4.4 g/dL (ref 3.5–4.7)
BILIRUBIN, DIRECT: 0.2 mg/dL (ref 0.00–0.40)
Bilirubin Total: 0.6 mg/dL (ref 0.0–1.2)
TOTAL PROTEIN: 7.3 g/dL (ref 6.0–8.5)

## 2017-10-21 LAB — BASIC METABOLIC PANEL
BUN/Creatinine Ratio: 24 (ref 10–24)
BUN: 33 mg/dL — AB (ref 8–27)
CALCIUM: 10 mg/dL (ref 8.6–10.2)
CHLORIDE: 98 mmol/L (ref 96–106)
CO2: 26 mmol/L (ref 20–29)
CREATININE: 1.37 mg/dL — AB (ref 0.76–1.27)
GFR calc Af Amer: 56 mL/min/{1.73_m2} — ABNORMAL LOW (ref >59)
GFR calc non Af Amer: 48 mL/min/{1.73_m2} — ABNORMAL LOW (ref >59)
GLUCOSE: 99 mg/dL (ref 65–99)
Potassium: 5.1 mmol/L (ref 3.5–5.2)
Sodium: 139 mmol/L (ref 134–144)

## 2017-10-21 LAB — PRO B NATRIURETIC PEPTIDE: NT-Pro BNP: 1530 pg/mL — ABNORMAL HIGH (ref 0–486)

## 2017-10-21 NOTE — Telephone Encounter (Signed)
Medication list reviewed in anticipation of upcoming Tikosyn initiation. Patient is not taking any contraindicated or QTc prolonging medications.   Patient is anticoagulated on Eliquis 5mg  BID on the appropriate dose. Please ensure that patient has not missed any anticoagulation doses in the 3 weeks prior to Tikosyn initiation.   Patient will need to be counseled to avoid use of Benadryl while on Tikosyn and in the 2-3 days prior to Tikosyn initiation.  CrCl is 77mL/min, anticipated starting dose of Tikosyn is 521mcg BID.

## 2017-10-24 ENCOUNTER — Telehealth: Payer: Self-pay

## 2017-10-24 DIAGNOSIS — R7989 Other specified abnormal findings of blood chemistry: Secondary | ICD-10-CM

## 2017-10-24 DIAGNOSIS — I77819 Aortic ectasia, unspecified site: Secondary | ICD-10-CM

## 2017-10-24 NOTE — Telephone Encounter (Signed)
Notes recorded by Teressa Senter, RN on 10/24/2017 at 1:09 PM EDT Patient made aware of echo results. Patient scheduled with Ellen Henri on 11/01/17. Patient in agreement with plan and thankful for the call. Echo ordered to be scheduled in 6 months.   Notes recorded by Sueanne Margarita, MD on 10/22/2017 at 5:38 PM EST Repeat echo in 6 months to reevaluate aorta   Notes recorded by Sueanne Margarita, MD on 10/22/2017 at 5:38 PM EST Echo showed moderately reduced LVF with EF 35-40%, mild AR, moderately dilated aortic root at 91mm, mild MR - EF has declined since last echo (50-55% before) an aortic root has increased in size (38mm before). ? Whether reduced LVF due to afib of if this is due to worsening CAD. He had borderline dz on cath 3 years ago. His afib has actually been well controlled so not sure his CM is due to tachycardia and could be ischemia. Please set up right and left heart cath and have him come in to see extender early next week to get cath set up

## 2017-10-24 NOTE — Addendum Note (Signed)
Addended by: Teressa Senter on: 10/24/2017 01:12 PM   Modules accepted: Orders

## 2017-10-25 ENCOUNTER — Telehealth: Payer: Self-pay | Admitting: Cardiology

## 2017-10-25 DIAGNOSIS — E785 Hyperlipidemia, unspecified: Secondary | ICD-10-CM

## 2017-10-25 MED ORDER — EZETIMIBE 10 MG PO TABS: 10.0000 mg | ORAL_TABLET | Freq: Every day | ORAL | 3 refills | Status: DC

## 2017-10-25 NOTE — Telephone Encounter (Signed)
Notes recorded by Teressa Senter, RN on 10/25/2017 at 1:41 PM EDT Spoke with patient, instructed to START Zetia 10 mg once a day along with crestor 40 mg once a day to lower LDL. Patient in agreement with plan and thankful for the call. Patient schedule for repeat labs on 12/22/17.   Notes recorded by Leeroy Bock, RPH on 10/24/2017 at 1:16 PM EDT LDL 81 close to goal < 70 due to hx of CAD. Would confirm pt is adherent to Crestor 40mg . If so, would recommend starting Zetia 10mg  daily. If pt does not want to start new therapy, would encourage him to to limit intake of saturated fat.

## 2017-10-25 NOTE — Telephone Encounter (Signed)
New message    Patient calling to discuss labs

## 2017-11-01 ENCOUNTER — Encounter: Payer: Self-pay | Admitting: *Deleted

## 2017-11-01 ENCOUNTER — Encounter: Payer: Self-pay | Admitting: Cardiology

## 2017-11-01 ENCOUNTER — Other Ambulatory Visit: Payer: PPO | Admitting: *Deleted

## 2017-11-01 ENCOUNTER — Ambulatory Visit: Payer: PPO | Admitting: Cardiology

## 2017-11-01 VITALS — BP 112/68 | HR 54 | Ht 72.0 in | Wt 255.0 lb

## 2017-11-01 DIAGNOSIS — R0600 Dyspnea, unspecified: Secondary | ICD-10-CM | POA: Diagnosis not present

## 2017-11-01 DIAGNOSIS — R7989 Other specified abnormal findings of blood chemistry: Secondary | ICD-10-CM | POA: Diagnosis not present

## 2017-11-01 DIAGNOSIS — I5020 Unspecified systolic (congestive) heart failure: Secondary | ICD-10-CM | POA: Diagnosis not present

## 2017-11-01 NOTE — H&P (View-Only) (Signed)
11/01/2017 [name]   Mar 17, 1935  [id number]  Primary Physician Lajean Manes, MD Primary Cardiologist: Dr. Radford Pax   Reason for Visit/CC: Abnormal Echo, Cardiomyopathy   HPI:  [name] is a 83 y.o. male, followed by Dr. Radford Pax, who presents to clinic to discuss [facility], recommended by Dr. Radford Pax, given recent abnormal echo showing reduced LVEF, down to 30-35%.  He has a hx of PAF s/p DCCV 10/2014, dilated aortic root at (4.4 cm) and ascending aorta (4.0 cm) on most recent assessment. He has h/o systolic HF, however his most recent echo showed normalization of EF at 50-55% (previously 40- 45%). He has also ASCAD by cath with30% LM at the ostium, 70-80% ostial D2, 30-40% prox LCX, 30-40% prox and 40% mid RCA and is on medical management.He has mild OSA and is on CPAP therapy and reports full compliance. He is on chronic anticoagulation therapy with Eliquis. He was previously on amiodarone for his afib, however this was discontinued 10/2016 due to intolerances (visual disturbances and bilateral hand numbness). He has considered Tikosyn and plans are to admitted later in April for loading.   Pt was recently seen by Dr. Radford Pax for his yearly f/u with her and he complained of exertional dyspnea.  BNP was elevated at 1,530. Lasix was adjusted. Pt encouraged to stop NSAIDs (Mobic for OA). Echo was also ordered and showed moderately reduced LVF with EF 35-40%, mild AR, moderately dilated aortic root at 45m, mild MR. His EF has declined since last echo (50-55% before) and aortic root has increased in size (473mbefore). Pt has borderline CAD on cath 3 years ago, thus Dr[facility] Tu[facility]ford [facility]as [facility]   He presents to clinic today for f/u. No chest pain but he has had exertional dyspnea. He reports full compliance with Lasix. No edema. BP is 112/68. Pulse rate is 54 bpm. He understands risk for cath and agrees to proceed.   Cardiac Studies 10/20/17 Study Conclusions  -  Left ventricle: The cavity size was moderately dilated. There was   mild concentric hypertrophy. Systolic function was moderately   reduced. The estimated ejection fraction was in the range of 35%   to 40%. Diffuse hypokinesis. The study was not technically   sufficient to allow evaluation of LV diastolic dysfunction due to   atrial fibrillation. - Aortic valve: Trileaflet; mildly thickened, mildly calcified   leaflets. There was mild regurgitation. - Aortic root: The aortic root was dilated measuring 46 mm. - Ascending aorta: The ascending aorta was normal size. - Mitral valve: Calcified annulus. Mildly thickened leaflets .   There was mild regurgitation. - Left atrium: The atrium was severely dilated. - Right ventricle: Systolic function was normal. - Right atrium: The atrium was moderately dilated. - Tricuspid valve: There was mild regurgitation. - Pulmonary arteries: Systolic pressure was within the normal   range.  Impressions:  - When compared to the prior study from 11/25/2016 LVEF has   decreased to 35-40% with diffuse hypokinesis.  Current Meds  Medication Sig  . acetaminophen (TYLENOL) 650 MG CR tablet Take 650 mg by mouth every 8 (eight) hours as needed for pain.  . Marland Kitchenpixaban (ELIQUIS) 5 MG TABS tablet Take 1 tablet (5 mg total) by mouth 2 (two) times daily.  . Calcium Carb-Cholecalciferol 500-400 MG-UNIT TABS Take 1 tablet by mouth daily.  . Marland Kitchenzetimibe (ZETIA) 10 MG tablet Take 1 tablet (10 mg total) by mouth daily.  . furosemide (LASIX) 20 MG tablet Take  1 tablet (20 mg total) by mouth daily. May take an additional 20 mg by mouth daily for swelling or weight gain of 3 pounds.  . loratadine (CLARITIN) 10 MG tablet Take 10 mg by mouth daily as needed for allergies.  . meloxicam (MOBIC) 15 MG tablet Take 15 mg by mouth daily.   . metoprolol succinate (TOPROL-XL) 50 MG 24 hr tablet Take 1 tablet (50 mg total) by mouth daily. Take with or immediately following a meal.  .  MULTIPLE VITAMINS PO Take 1 tablet by mouth daily.  Marland Kitchen omeprazole (PRILOSEC) 20 MG capsule Take 20 mg by mouth daily.   . polyethylene glycol (MIRALAX / GLYCOLAX) packet Take 17 g by mouth daily as needed (constipation).   . rosuvastatin (CRESTOR) 40 MG tablet Take 1 tablet (40 mg total) by mouth daily.   Allergies  Allergen Reactions  . Simvastatin Other (See Comments)    Muscle pain elevated CPK  . Codeine Phosphate Other (See Comments)    constipation  . Penicillins Rash    NOT AN ALLERGY PER PT, thinks the needle used dirty   Past Medical History:  Diagnosis Date  . Back pain   . Benign essential HTN 09/17/2014  . CAD (coronary artery disease), native coronary artery 09/20/2014   30% LM at the ostium, 70-80% ostial D2, 30-40% prox LCX, 30-40% prox and 40% mid RCA and EF 40%.    . DCM (dilated cardiomyopathy) ([place]) 09/17/2014   a.  EF 40%; b. Echo 5/16:  EF 40-45%, diff HK, inf AK, mild AI, MAC, severe LAE  . Dilated aortic root ([place]) 09/17/2014   normal diameter on echo 2017  . DJD (degenerative joint disease)    feet, knees and back  . GERD (gastroesophageal reflux disease)    hiatal hernia  . Hyperlipidemia LDL goal <70 10/14/2015  . Lesion of left femoral nerve    3 cm MRI 04/2010 consistent with hemangioma  . Mild aortic insufficiency   . Obesity   . OSA (obstructive sleep apnea) 10/14/2015   Mild with AHI 10.6/hr overall and 38/hr during REM sleep.  On CPAP at 9cm H2O  . PAF (paroxysmal atrial fibrillation) ([place]) 09/17/2014  . PSA (psoriatic arthritis) (H[facility])    Mild increase 3.65 on 05/2011 stable 3.68 on 09/2011  . Renal cyst, left 2010   5 cm seen on ultrasound    Family History  Problem Relation Age of Onset  . Cancer Mother   . Diabetes Father   . Heart disease Sister   . Atrial fibrillation Sister   . Heart attack Maternal Grandmother   . Stroke Maternal Grandfather   . Heart attack Paternal Grandmother   . Heart attack Paternal Grandfather    Past Surgical  History:  Procedure Laterality Date  . CARDIOVERSION N/A 11/04/2014   Procedure: CARDIOVERSION;  Surgeon: Sueanne Margarita, MD;  Location: [place] ENDOSCOPY;  Service: Cardiovascular;  Laterality: N/A;  . CARDIOVERSION N/A 01/09/2015   Procedure: CARDIOVERSION;  Surgeon: Pixie Casino, MD;  Location: [place];  Service: Cardiovascular;  Laterality: N/A;  . LEFT HEART CATHETERIZATION WITH CORONARY ANGIOGRAM N/A 10/01/2014   Procedure: LEFT HEART CATHETERIZATION WITH CORONARY ANGIOGRAM;  Surgeon: Troy Sine, MD;  Location: [facility] CATH LAB;  Service: Cardiovascular;  Laterality: N/A;   Social History   Socioeconomic History  . Marital status: Married    Spouse name: Not on file  . Number of children: 4  . Years of education: Not on file  . Highest  education level: Not on file  Social Needs  . Financial resource strain: Not on file  . Food insecurity - worry: Not on file  . Food insecurity - inability: Not on file  . Transportation needs - medical: Not on file  . Transportation needs - non-medical: Not on file  Occupational History  . Occupation: Naval architect  Tobacco Use  . Smoking status: Former Research scientist (life sciences)  . Smokeless tobacco: Never Used  Substance and Sexual Activity  . Alcohol use: No  . Drug use: No  . Sexual activity: Yes  Other Topics Concern  . Not on file  Social History Narrative  . Not on file     Review of Systems: General: negative for chills, fever, night sweats or weight changes.  Cardiovascular: negative for chest pain, dyspnea on exertion, edema, orthopnea, palpitations, paroxysmal nocturnal dyspnea or shortness of breath Dermatological: negative for rash Respiratory: negative for cough or wheezing Urologic: negative for hematuria Abdominal: negative for nausea, vomiting, diarrhea, bright red blood per rectum, melena, or hematemesis Neurologic: negative for visual changes, syncope, or dizziness All other systems reviewed and are otherwise negative except as  noted above.   Physical Exam:  Blood pressure 112/68, pulse (!) 54, height 6' (1.829 m), weight 255 lb (115.7 kg), SpO2 98 %.  General appearance: alert, cooperative and no distress Neck: no carotid bruit, no JVD and supple, symmetrical, trachea midline Lungs: clear to auscultation bilaterally Heart: irregularly irregular rhythm and regular rate Extremities: extremities normal, atraumatic, no cyanosis or edema Pulses: 2+ and symmetric Skin: Skin color, texture, turgor normal. No rashes or lesions Neurologic: Grossly normal  EKG not performed -- personally reviewed   ASSESSMENT AND PLAN:   1. Cardiomyopathy: moderately reduced LVF with EF 35-40%, mild AR, moderately dilated aortic root at 33m, mild MR. His EF has declined since last echo (50-55% before) and aortic root has increased in size (462mbefore). Pt has borderline CAD on cath 3 years ago, thus Dr. TuRadford Paxas [facility]. I have reviewed the risks, indications, and alternatives to cardiac catheterization and possible angioplasty/stenting with the patient. Risks include but are not limited to bleeding, infection, vascular injury, stroke, myocardial infection, arrhythmia, kidney injury, radiation-related injury in the case of prolonged fluoroscopy use, emergency cardiac surgery, and death. The patient understands the risks of serious complication is low (<1<3% Obtain pre procedural labs today.   2. CAD: borderline CAD on cath 3 years ago. He denies CP but has had exertional dyspnea and drop in EF. Plan for LHC as outlined above. Continue medial therapy.   3. Persistent Atrial Fibrillation: rate is controlled and he is on Eliquis. Ultimate plan is for Tikosyn loading, which will be arranged by the [facility]. Pt will need to hold Eliquis 48 hrs prior to cath. I discussed this with DoRoderic PalauNP, today. This should not postpone Tikosyn loading in April. A TEE can be obtained prior to initiation if pt still in  agreement to start Tikosyn.   4. Dilated Aortic Root: moderately dilated aortic root at 4617mslight increased from previous study (44 mm). Dr. TurRadford Pax continue to follow.    Follow-Up: R/LHC next week as recommended by Dr. TurRadford Pax/u post cath.   Mahmood Boehringer SimLadoris [facility] 11/01/2017 1:33 PM

## 2017-11-01 NOTE — Progress Notes (Signed)
11/01/2017 [name]   Mar 17, 1935  [id number]  Primary Physician Lajean Manes, MD Primary Cardiologist: Dr. Radford Pax   Reason for Visit/CC: Abnormal Echo, Cardiomyopathy   HPI:  [name] is a 83 y.o. male, followed by Dr. Radford Pax, who presents to clinic to discuss [facility], recommended by Dr. Radford Pax, given recent abnormal echo showing reduced LVEF, down to 30-35%.  He has a hx of PAF s/p DCCV 10/2014, dilated aortic root at (4.4 cm) and ascending aorta (4.0 cm) on most recent assessment. He has h/o systolic HF, however his most recent echo showed normalization of EF at 50-55% (previously 40- 45%). He has also ASCAD by cath with30% LM at the ostium, 70-80% ostial D2, 30-40% prox LCX, 30-40% prox and 40% mid RCA and is on medical management.He has mild OSA and is on CPAP therapy and reports full compliance. He is on chronic anticoagulation therapy with Eliquis. He was previously on amiodarone for his afib, however this was discontinued 10/2016 due to intolerances (visual disturbances and bilateral hand numbness). He has considered Tikosyn and plans are to admitted later in April for loading.   Pt was recently seen by Dr. Radford Pax for his yearly f/u with her and he complained of exertional dyspnea.  BNP was elevated at 1,530. Lasix was adjusted. Pt encouraged to stop NSAIDs (Mobic for OA). Echo was also ordered and showed moderately reduced LVF with EF 35-40%, mild AR, moderately dilated aortic root at 45m, mild MR. His EF has declined since last echo (50-55% before) and aortic root has increased in size (473mbefore). Pt has borderline CAD on cath 3 years ago, thus Dr. TuRadford Paxas [facility].   He presents to clinic today for f/u. No chest pain but he has had exertional dyspnea. He reports full compliance with Lasix. No edema. BP is 112/68. Pulse rate is 54 bpm. He understands risk for cath and agrees to proceed.   Cardiac Studies 10/20/17 Study Conclusions  -  Left ventricle: The cavity size was moderately dilated. There was   mild concentric hypertrophy. Systolic function was moderately   reduced. The estimated ejection fraction was in the range of 35%   to 40%. Diffuse hypokinesis. The study was not technically   sufficient to allow evaluation of LV diastolic dysfunction due to   atrial fibrillation. - Aortic valve: Trileaflet; mildly thickened, mildly calcified   leaflets. There was mild regurgitation. - Aortic root: The aortic root was dilated measuring 46 mm. - Ascending aorta: The ascending aorta was normal size. - Mitral valve: Calcified annulus. Mildly thickened leaflets .   There was mild regurgitation. - Left atrium: The atrium was severely dilated. - Right ventricle: Systolic function was normal. - Right atrium: The atrium was moderately dilated. - Tricuspid valve: There was mild regurgitation. - Pulmonary arteries: Systolic pressure was within the normal   range.  Impressions:  - When compared to the prior study from 11/25/2016 LVEF has   decreased to 35-40% with diffuse hypokinesis.  Current Meds  Medication Sig  . acetaminophen (TYLENOL) 650 MG CR tablet Take 650 mg by mouth every 8 (eight) hours as needed for pain.  . Marland Kitchenpixaban (ELIQUIS) 5 MG TABS tablet Take 1 tablet (5 mg total) by mouth 2 (two) times daily.  . Calcium Carb-Cholecalciferol 500-400 MG-UNIT TABS Take 1 tablet by mouth daily.  . Marland Kitchenzetimibe (ZETIA) 10 MG tablet Take 1 tablet (10 mg total) by mouth daily.  . furosemide (LASIX) 20 MG tablet Take  1 tablet (20 mg total) by mouth daily. May take an additional 20 mg by mouth daily for swelling or weight gain of 3 pounds.  . loratadine (CLARITIN) 10 MG tablet Take 10 mg by mouth daily as needed for allergies.  . meloxicam (MOBIC) 15 MG tablet Take 15 mg by mouth daily.   . metoprolol succinate (TOPROL-XL) 50 MG 24 hr tablet Take 1 tablet (50 mg total) by mouth daily. Take with or immediately following a meal.  .  MULTIPLE VITAMINS PO Take 1 tablet by mouth daily.  Marland Kitchen omeprazole (PRILOSEC) 20 MG capsule Take 20 mg by mouth daily.   . polyethylene glycol (MIRALAX / GLYCOLAX) packet Take 17 g by mouth daily as needed (constipation).   . rosuvastatin (CRESTOR) 40 MG tablet Take 1 tablet (40 mg total) by mouth daily.   Allergies  Allergen Reactions  . Simvastatin Other (See Comments)    Muscle pain elevated CPK  . Codeine Phosphate Other (See Comments)    constipation  . Penicillins Rash    NOT AN ALLERGY PER PT, thinks the needle used dirty   Past Medical History:  Diagnosis Date  . Back pain   . Benign essential HTN 09/17/2014  . CAD (coronary artery disease), native coronary artery 09/20/2014   30% LM at the ostium, 70-80% ostial D2, 30-40% prox LCX, 30-40% prox and 40% mid RCA and EF 40%.    . DCM (dilated cardiomyopathy) ([place]) 09/17/2014   a.  EF 40%; b. Echo 5/16:  EF 40-45%, diff HK, inf AK, mild AI, MAC, severe LAE  . Dilated aortic root ([place]) 09/17/2014   normal diameter on echo 2017  . DJD (degenerative joint disease)    feet, knees and back  . GERD (gastroesophageal reflux disease)    hiatal hernia  . Hyperlipidemia LDL goal <70 10/14/2015  . Lesion of left femoral nerve    3 cm MRI 04/2010 consistent with hemangioma  . Mild aortic insufficiency   . Obesity   . OSA (obstructive sleep apnea) 10/14/2015   Mild with AHI 10.6/hr overall and 38/hr during REM sleep.  On CPAP at 9cm H2O  . PAF (paroxysmal atrial fibrillation) ([place]) 09/17/2014  . PSA (psoriatic arthritis) ([facility])    Mild increase 3.65 on 05/2011 stable 3.68 on 09/2011  . Renal cyst, left 2010   5 cm seen on ultrasound    Family History  Problem Relation Age of Onset  . Cancer Mother   . Diabetes Father   . Heart disease Sister   . Atrial fibrillation Sister   . Heart attack Maternal Grandmother   . Stroke Maternal Grandfather   . Heart attack Paternal Grandmother   . Heart attack Paternal Grandfather    Past Surgical  History:  Procedure Laterality Date  . CARDIOVERSION N/A 11/04/2014   Procedure: CARDIOVERSION;  Surgeon: Sueanne Margarita, MD;  Location: [place] ENDOSCOPY;  Service: Cardiovascular;  Laterality: N/A;  . CARDIOVERSION N/A 01/09/2015   Procedure: CARDIOVERSION;  Surgeon: Pixie Casino, MD;  Location: [place];  Service: Cardiovascular;  Laterality: N/A;  . LEFT HEART CATHETERIZATION WITH CORONARY ANGIOGRAM N/A 10/01/2014   Procedure: LEFT HEART CATHETERIZATION WITH CORONARY ANGIOGRAM;  Surgeon: Troy Sine, MD;  Location: [facility] CATH LAB;  Service: Cardiovascular;  Laterality: N/A;   Social History   Socioeconomic History  . Marital status: Married    Spouse name: Not on file  . Number of children: 4  . Years of education: Not on file  . Highest  education level: Not on file  Social Needs  . Financial resource strain: Not on file  . Food insecurity - worry: Not on file  . Food insecurity - inability: Not on file  . Transportation needs - medical: Not on file  . Transportation needs - non-medical: Not on file  Occupational History  . Occupation: Naval architect  Tobacco Use  . Smoking status: Former Research scientist (life sciences)  . Smokeless tobacco: Never Used  Substance and Sexual Activity  . Alcohol use: No  . Drug use: No  . Sexual activity: Yes  Other Topics Concern  . Not on file  Social History Narrative  . Not on file     Review of Systems: General: negative for chills, fever, night sweats or weight changes.  Cardiovascular: negative for chest pain, dyspnea on exertion, edema, orthopnea, palpitations, paroxysmal nocturnal dyspnea or shortness of breath Dermatological: negative for rash Respiratory: negative for cough or wheezing Urologic: negative for hematuria Abdominal: negative for nausea, vomiting, diarrhea, bright red blood per rectum, melena, or hematemesis Neurologic: negative for visual changes, syncope, or dizziness All other systems reviewed and are otherwise negative except as  noted above.   Physical Exam:  Blood pressure 112/68, pulse (!) 54, height 6' (1.829 m), weight 255 lb (115.7 kg), SpO2 98 %.  General appearance: alert, cooperative and no distress Neck: no carotid bruit, no JVD and supple, symmetrical, trachea midline Lungs: clear to auscultation bilaterally Heart: irregularly irregular rhythm and regular rate Extremities: extremities normal, atraumatic, no cyanosis or edema Pulses: 2+ and symmetric Skin: Skin color, texture, turgor normal. No rashes or lesions Neurologic: Grossly normal  EKG not performed -- personally reviewed   ASSESSMENT AND PLAN:   1. Cardiomyopathy: moderately reduced LVF with EF 35-40%, mild AR, moderately dilated aortic root at 33m, mild MR. His EF has declined since last echo (50-55% before) and aortic root has increased in size (462mbefore). Pt has borderline CAD on cath 3 years ago, thus Dr. TuRadford Paxas [facility]. I have reviewed the risks, indications, and alternatives to cardiac catheterization and possible angioplasty/stenting with the patient. Risks include but are not limited to bleeding, infection, vascular injury, stroke, myocardial infection, arrhythmia, kidney injury, radiation-related injury in the case of prolonged fluoroscopy use, emergency cardiac surgery, and death. The patient understands the risks of serious complication is low (<1<3% Obtain pre procedural labs today.   2. CAD: borderline CAD on cath 3 years ago. He denies CP but has had exertional dyspnea and drop in EF. Plan for LHC as outlined above. Continue medial therapy.   3. Persistent Atrial Fibrillation: rate is controlled and he is on Eliquis. Ultimate plan is for Tikosyn loading, which will be arranged by the [facility] Clinic. Pt will need to hold Eliquis 48 hrs prior to cath. I discussed this with DoRoderic PalauNP, today. This should not postpone Tikosyn loading in April. A TEE can be obtained prior to initiation if pt still in  agreement to start Tikosyn.   4. Dilated Aortic Root: moderately dilated aortic root at 4617mslight increased from previous study (44 mm). Dr. TurRadford Pax continue to follow.    Follow-Up: R/LHC next week as recommended by Dr. TurRadford Pax/u post cath.   Brittainy SimLadoris [facility] 11/01/2017 1:33 PM

## 2017-11-01 NOTE — Patient Instructions (Addendum)
Medication Instructions:   Your physician recommends that you continue on your current medications as directed. Please refer to the Current Medication list given to you today.   If you need a refill on your cardiac medications before your next appointment, please call your pharmacy.  Labwork:  CBC AND PT/PTT TODAY    Testing/Procedures:   SEE LETTER FOR  CATH INSTRUCTIONS ON  11-07-17   Follow-Up:  BASED UPON RESULTS   Any Other Special Instructions Will Be Listed Below (If Applicable).

## 2017-11-02 LAB — PT AND PTT
INR: 1.1 (ref 0.8–1.2)
PROTHROMBIN TIME: 11.1 s (ref 9.1–12.0)
aPTT: 28 s (ref 24–33)

## 2017-11-02 LAB — CBC
HEMOGLOBIN: 15 g/dL (ref 13.0–17.7)
Hematocrit: 46.1 % (ref 37.5–51.0)
MCH: 27 pg (ref 26.6–33.0)
MCHC: 32.5 g/dL (ref 31.5–35.7)
MCV: 83 fL (ref 79–97)
PLATELETS: 176 10*3/uL (ref 150–379)
RBC: 5.55 x10E6/uL (ref 4.14–5.80)
RDW: 14.4 % (ref 12.3–15.4)
WBC: 7.7 10*3/uL (ref 3.4–10.8)

## 2017-11-03 ENCOUNTER — Telehealth: Payer: Self-pay | Admitting: *Deleted

## 2017-11-03 LAB — BASIC METABOLIC PANEL
BUN/Creatinine Ratio: 26 — ABNORMAL HIGH (ref 10–24)
BUN: 30 mg/dL — AB (ref 8–27)
CALCIUM: 9.6 mg/dL (ref 8.6–10.2)
CO2: 23 mmol/L (ref 20–29)
CREATININE: 1.16 mg/dL (ref 0.76–1.27)
Chloride: 102 mmol/L (ref 96–106)
GFR calc Af Amer: 68 mL/min/{1.73_m2} (ref >59)
GFR calc non Af Amer: 59 mL/min/{1.73_m2} — ABNORMAL LOW (ref >59)
GLUCOSE: 110 mg/dL — AB (ref 65–99)
Potassium: 5.5 mmol/L — ABNORMAL HIGH (ref 3.5–5.2)
Sodium: 140 mmol/L (ref 134–144)

## 2017-11-03 NOTE — Progress Notes (Signed)
Pt has been made aware of normal result and verbalized understanding.  jw 11/03/17

## 2017-11-03 NOTE — Telephone Encounter (Signed)
Pt contacted pre-catheterization scheduled at [facility] for: Monday November 07, 2017 10:30 AM Verified arrival time and place: [place] at: 8 AM Nothing to eat or drink after midnight prior to cath. Verified no diabetes medications. Verified allergies in Epic.  Hold: Apixaban 11/05/17, 11/06/17, and 11/07/17 until post cath instructed to restart Furosemide AM of cath  Except hold medications AM meds can be taken pre-cath with sip of water including: ASA 81 mg  Confirmed patient has responsible person to drive home post procedure and observe patient for 24 hours: yes

## 2017-11-04 ENCOUNTER — Telehealth: Payer: Self-pay

## 2017-11-04 DIAGNOSIS — E875 Hyperkalemia: Secondary | ICD-10-CM

## 2017-11-04 NOTE — Telephone Encounter (Signed)
Notes recorded by Teressa Senter, RN on 11/04/2017 at 11:19 AM EDT I spoke with the patient. He states he has been eating a lot of oranges, pineapples and orange juice. I advised patient to monitor intake of potassium rich foods. Patient refuses to stop Mobic, patient has an appt at [place] [facility] on 11/14/17. Patient stated he would get his bmet drawn at that time. I informed patient I would send to Dr. Radford Pax for review. He verbalized understanding and thankful for the call.   Notes recorded by Sueanne Margarita, MD on 11/03/2017 at 10:30 PM EDT Please forward this to patient's PCP - K+ too high and he is on Mobic - recommend he stop the mobic and repeat BMET in 1 week

## 2017-11-04 NOTE — Telephone Encounter (Signed)
-----   Message from Tamsen Snider sent at 11/04/2017 10:45 AM EDT ----- Routing to Dr. Theodosia Blender nurse

## 2017-11-07 ENCOUNTER — Other Ambulatory Visit: Payer: Self-pay

## 2017-11-07 ENCOUNTER — Ambulatory Visit (HOSPITAL_COMMUNITY)
Admission: RE | Disposition: A | Discharge status: Home / Self Care | Payer: Self-pay | Source: Ambulatory Visit | Attending: Cardiology

## 2017-11-07 ENCOUNTER — Ambulatory Visit (HOSPITAL_COMMUNITY)
Admission: RE | Admit: 2017-11-07 | Discharge: 2017-11-08 | Disposition: A | Discharge status: Home / Self Care | Payer: PPO | Source: Ambulatory Visit | Attending: Cardiology | Admitting: Cardiology

## 2017-11-07 ENCOUNTER — Encounter (HOSPITAL_COMMUNITY): Payer: Self-pay | Admitting: General Practice

## 2017-11-07 DIAGNOSIS — I420 Dilated cardiomyopathy: Secondary | ICD-10-CM | POA: Diagnosis not present

## 2017-11-07 DIAGNOSIS — Z79899 Other long term (current) drug therapy: Secondary | ICD-10-CM | POA: Insufficient documentation

## 2017-11-07 DIAGNOSIS — I5042 Chronic combined systolic (congestive) and diastolic (congestive) heart failure: Secondary | ICD-10-CM | POA: Diagnosis not present

## 2017-11-07 DIAGNOSIS — I2510 Atherosclerotic heart disease of native coronary artery without angina pectoris: Secondary | ICD-10-CM

## 2017-11-07 DIAGNOSIS — I25119 Atherosclerotic heart disease of native coronary artery with unspecified angina pectoris: Secondary | ICD-10-CM | POA: Diagnosis present

## 2017-11-07 DIAGNOSIS — Z885 Allergy status to narcotic agent status: Secondary | ICD-10-CM | POA: Insufficient documentation

## 2017-11-07 DIAGNOSIS — G4733 Obstructive sleep apnea (adult) (pediatric): Secondary | ICD-10-CM | POA: Diagnosis present

## 2017-11-07 DIAGNOSIS — I2729 Other secondary pulmonary hypertension: Secondary | ICD-10-CM | POA: Insufficient documentation

## 2017-11-07 DIAGNOSIS — E669 Obesity, unspecified: Secondary | ICD-10-CM | POA: Diagnosis present

## 2017-11-07 DIAGNOSIS — I480 Paroxysmal atrial fibrillation: Secondary | ICD-10-CM | POA: Diagnosis not present

## 2017-11-07 DIAGNOSIS — I4819 Other persistent atrial fibrillation: Secondary | ICD-10-CM | POA: Diagnosis present

## 2017-11-07 DIAGNOSIS — I493 Ventricular premature depolarization: Secondary | ICD-10-CM | POA: Diagnosis not present

## 2017-11-07 DIAGNOSIS — I110 Hypertensive heart disease with heart failure: Secondary | ICD-10-CM | POA: Insufficient documentation

## 2017-11-07 DIAGNOSIS — Z7901 Long term (current) use of anticoagulants: Secondary | ICD-10-CM

## 2017-11-07 DIAGNOSIS — I481 Persistent atrial fibrillation: Secondary | ICD-10-CM | POA: Diagnosis not present

## 2017-11-07 DIAGNOSIS — I10 Essential (primary) hypertension: Secondary | ICD-10-CM | POA: Diagnosis present

## 2017-11-07 DIAGNOSIS — Z9861 Coronary angioplasty status: Secondary | ICD-10-CM

## 2017-11-07 DIAGNOSIS — Z880 Allergy status to penicillin: Secondary | ICD-10-CM | POA: Insufficient documentation

## 2017-11-07 DIAGNOSIS — Z87891 Personal history of nicotine dependence: Secondary | ICD-10-CM | POA: Insufficient documentation

## 2017-11-07 DIAGNOSIS — E785 Hyperlipidemia, unspecified: Secondary | ICD-10-CM | POA: Diagnosis present

## 2017-11-07 DIAGNOSIS — Z955 Presence of coronary angioplasty implant and graft: Secondary | ICD-10-CM

## 2017-11-07 HISTORY — DX: Dependence on other enabling machines and devices: Z99.89

## 2017-11-07 HISTORY — DX: Unspecified osteoarthritis, unspecified site: M19.90

## 2017-11-07 HISTORY — PX: CORONARY STENT INTERVENTION: CATH118234

## 2017-11-07 HISTORY — DX: Other specified postprocedural states: Z98.890

## 2017-11-07 HISTORY — DX: Pure hypercholesterolemia, unspecified: E78.00

## 2017-11-07 HISTORY — DX: Other chronic pain: G89.29

## 2017-11-07 HISTORY — DX: Inflammatory liver disease, unspecified: K75.9

## 2017-11-07 HISTORY — PX: RIGHT/LEFT HEART CATH AND CORONARY ANGIOGRAPHY: CATH118266

## 2017-11-07 HISTORY — DX: Personal history of other medical treatment: Z92.89

## 2017-11-07 HISTORY — DX: Low back pain, unspecified: M54.50

## 2017-11-07 HISTORY — DX: Obstructive sleep apnea (adult) (pediatric): G47.33

## 2017-11-07 HISTORY — DX: Low back pain: M54.5

## 2017-11-07 LAB — POCT I-STAT 3, ART BLOOD GAS (G3+)
Acid-base deficit: 3 mmol/L — ABNORMAL HIGH (ref 0.0–2.0)
Bicarbonate: 21.5 mmol/L (ref 20.0–28.0)
O2 Saturation: 96 %
PCO2 ART: 36.8 mmHg (ref 32.0–48.0)
PH ART: 7.376 (ref 7.350–7.450)
PO2 ART: 83 mmHg (ref 83.0–108.0)
TCO2: 23 mmol/L (ref 22–32)

## 2017-11-07 LAB — MAGNESIUM: MAGNESIUM: 1.9 mg/dL (ref 1.7–2.4)

## 2017-11-07 LAB — POCT I-STAT 3, VENOUS BLOOD GAS (G3P V)
Acid-base deficit: 1 mmol/L (ref 0.0–2.0)
Acid-base deficit: 2 mmol/L (ref 0.0–2.0)
BICARBONATE: 23.7 mmol/L (ref 20.0–28.0)
Bicarbonate: 24.5 mmol/L (ref 20.0–28.0)
O2 SAT: 61 %
O2 Saturation: 64 %
PCO2 VEN: 41.9 mmHg — AB (ref 44.0–60.0)
PH VEN: 7.346 (ref 7.250–7.430)
PO2 VEN: 34 mmHg (ref 32.0–45.0)
PO2 VEN: 35 mmHg (ref 32.0–45.0)
TCO2: 26 mmol/L (ref 22–32)
TCO2: 25 mmol/L (ref 22–32)
pCO2, Ven: 44.8 mmHg (ref 44.0–60.0)
pH, Ven: 7.362 (ref 7.250–7.430)

## 2017-11-07 LAB — POCT ACTIVATED CLOTTING TIME
Activated Clotting Time: 268 seconds
Activated Clotting Time: 301 seconds

## 2017-11-07 LAB — POTASSIUM: Potassium: 4.6 mmol/L (ref 3.5–5.1)

## 2017-11-07 SURGERY — RIGHT/LEFT HEART CATH AND CORONARY ANGIOGRAPHY
Anesthesia: LOCAL

## 2017-11-07 MED ORDER — LABETALOL HCL 5 MG/ML IV SOLN: 10.0000 mg | INTRAVENOUS | Status: AC | PRN

## 2017-11-07 MED ORDER — ASPIRIN 81 MG PO CHEW: 81.0000 mg | CHEWABLE_TABLET | ORAL | Status: DC

## 2017-11-07 MED ORDER — MIDAZOLAM HCL 2 MG/2ML IJ SOLN
INTRAMUSCULAR | Status: DC | PRN
  Administered 2017-11-07 (×2): 1 mg via INTRAVENOUS

## 2017-11-07 MED ORDER — METOPROLOL TARTRATE 5 MG/5ML IV SOLN
2.5000 mg | Freq: Once | INTRAVENOUS | Status: AC
  Administered 2017-11-07: 15:00:00 2.5 mg via INTRAVENOUS
  Filled 2017-11-07: qty 5

## 2017-11-07 MED ORDER — MIDAZOLAM HCL 2 MG/2ML IJ SOLN
INTRAMUSCULAR | Status: AC
  Filled 2017-11-07: qty 2

## 2017-11-07 MED ORDER — CLOPIDOGREL BISULFATE 300 MG PO TABS
ORAL_TABLET | ORAL | Status: DC | PRN
  Administered 2017-11-07: 600 mg via ORAL

## 2017-11-07 MED ORDER — EZETIMIBE 10 MG PO TABS
10.0000 mg | ORAL_TABLET | Freq: Every day | ORAL | Status: DC
  Administered 2017-11-07 – 2017-11-08 (×2): 10 mg via ORAL
  Filled 2017-11-07 (×2): qty 1

## 2017-11-07 MED ORDER — IOPAMIDOL (ISOVUE-370) INJECTION 76%
INTRAVENOUS | Status: AC
  Filled 2017-11-07: qty 100

## 2017-11-07 MED ORDER — POLYETHYLENE GLYCOL 3350 17 G PO PACK: 17.0000 g | PACK | Freq: Every day | ORAL | Status: DC | PRN

## 2017-11-07 MED ORDER — FUROSEMIDE 20 MG PO TABS
20.0000 mg | ORAL_TABLET | Freq: Every day | ORAL | Status: DC
  Administered 2017-11-08: 20 mg via ORAL
  Filled 2017-11-07: qty 1

## 2017-11-07 MED ORDER — SODIUM CHLORIDE 0.9% FLUSH
3.0000 mL | Freq: Two times a day (BID) | INTRAVENOUS | Status: DC
  Administered 2017-11-07 – 2017-11-08 (×2): 3 mL via INTRAVENOUS

## 2017-11-07 MED ORDER — SODIUM CHLORIDE 0.9% FLUSH: 3.0000 mL | INTRAVENOUS | Status: DC | PRN

## 2017-11-07 MED ORDER — HEPARIN SODIUM (PORCINE) 1000 UNIT/ML IJ SOLN
INTRAMUSCULAR | Status: DC | PRN
  Administered 2017-11-07: 6000 [IU] via INTRAVENOUS
  Administered 2017-11-07: 2000 [IU] via INTRAVENOUS
  Administered 2017-11-07: 6000 [IU] via INTRAVENOUS

## 2017-11-07 MED ORDER — SODIUM CHLORIDE 0.9 % IV SOLN: 250.0000 mL | INTRAVENOUS | Status: DC | PRN

## 2017-11-07 MED ORDER — HEPARIN SODIUM (PORCINE) 1000 UNIT/ML IJ SOLN
INTRAMUSCULAR | Status: AC
  Filled 2017-11-07: qty 1

## 2017-11-07 MED ORDER — ANGIOPLASTY BOOK
Freq: Once | Status: AC
  Administered 2017-11-08: 03:00:00
  Filled 2017-11-07: qty 1

## 2017-11-07 MED ORDER — APIXABAN 5 MG PO TABS
5.0000 mg | ORAL_TABLET | Freq: Two times a day (BID) | ORAL | Status: DC
  Administered 2017-11-07 – 2017-11-08 (×2): 5 mg via ORAL
  Filled 2017-11-07 (×2): qty 1

## 2017-11-07 MED ORDER — ACETAMINOPHEN 325 MG PO TABS: 650.0000 mg | ORAL_TABLET | Freq: Three times a day (TID) | ORAL | Status: DC | PRN

## 2017-11-07 MED ORDER — ONDANSETRON HCL 4 MG/2ML IJ SOLN: 4.0000 mg | Freq: Four times a day (QID) | INTRAMUSCULAR | Status: DC | PRN

## 2017-11-07 MED ORDER — SODIUM CHLORIDE 0.9% FLUSH: 3.0000 mL | Freq: Two times a day (BID) | INTRAVENOUS | Status: DC

## 2017-11-07 MED ORDER — SODIUM CHLORIDE 0.9 % WEIGHT BASED INFUSION
3.0000 mL/kg/h | INTRAVENOUS | Status: DC
  Administered 2017-11-07: 3 mL/kg/h via INTRAVENOUS

## 2017-11-07 MED ORDER — ALUM & MAG HYDROXIDE-SIMETH 200-200-20 MG/5ML PO SUSP
30.0000 mL | Freq: Four times a day (QID) | ORAL | Status: DC | PRN
  Administered 2017-11-07: 23:00:00 30 mL via ORAL
  Filled 2017-11-07: qty 30

## 2017-11-07 MED ORDER — SODIUM CHLORIDE 0.9 % IV SOLN
INTRAVENOUS | Status: AC
  Administered 2017-11-07: 15:00:00 via INTRAVENOUS

## 2017-11-07 MED ORDER — VERAPAMIL HCL 2.5 MG/ML IV SOLN
INTRAVENOUS | Status: AC
  Filled 2017-11-07: qty 2

## 2017-11-07 MED ORDER — FUROSEMIDE 10 MG/ML IJ SOLN
40.0000 mg | Freq: Once | INTRAMUSCULAR | Status: AC
  Administered 2017-11-07: 40 mg via INTRAVENOUS
  Filled 2017-11-07: qty 4

## 2017-11-07 MED ORDER — IOPAMIDOL (ISOVUE-370) INJECTION 76%
INTRAVENOUS | Status: DC | PRN
  Administered 2017-11-07: 240 mL via INTRA_ARTERIAL

## 2017-11-07 MED ORDER — FENTANYL CITRATE (PF) 100 MCG/2ML IJ SOLN
INTRAMUSCULAR | Status: DC | PRN
  Administered 2017-11-07 (×4): 25 ug via INTRAVENOUS

## 2017-11-07 MED ORDER — HEPARIN (PORCINE) IN NACL 2-0.9 UNIT/ML-% IJ SOLN
INTRAMUSCULAR | Status: AC
  Filled 2017-11-07: qty 1000

## 2017-11-07 MED ORDER — SODIUM CHLORIDE 0.9 % WEIGHT BASED INFUSION: 1.0000 mL/kg/h | INTRAVENOUS | Status: DC

## 2017-11-07 MED ORDER — ASPIRIN 81 MG PO CHEW
81.0000 mg | CHEWABLE_TABLET | Freq: Every day | ORAL | Status: DC
  Administered 2017-11-08: 09:00:00 81 mg via ORAL
  Filled 2017-11-07: qty 1

## 2017-11-07 MED ORDER — HEPARIN (PORCINE) IN NACL 2-0.9 UNIT/ML-% IJ SOLN
INTRAMUSCULAR | Status: AC | PRN
  Administered 2017-11-07 (×2): 500 mL

## 2017-11-07 MED ORDER — CLOPIDOGREL BISULFATE 300 MG PO TABS
ORAL_TABLET | ORAL | Status: AC
  Filled 2017-11-07: qty 2

## 2017-11-07 MED ORDER — PANTOPRAZOLE SODIUM 40 MG PO TBEC
40.0000 mg | DELAYED_RELEASE_TABLET | Freq: Every day | ORAL | Status: DC
  Administered 2017-11-08: 40 mg via ORAL
  Filled 2017-11-07: qty 1

## 2017-11-07 MED ORDER — LIDOCAINE HCL 1 % IJ SOLN
INTRAMUSCULAR | Status: AC
  Filled 2017-11-07: qty 20

## 2017-11-07 MED ORDER — LIDOCAINE HCL (PF) 1 % IJ SOLN
INTRAMUSCULAR | Status: DC | PRN
  Administered 2017-11-07: 5 mL via SUBCUTANEOUS

## 2017-11-07 MED ORDER — FENTANYL CITRATE (PF) 100 MCG/2ML IJ SOLN
INTRAMUSCULAR | Status: AC
  Filled 2017-11-07: qty 2

## 2017-11-07 MED ORDER — VERAPAMIL HCL 2.5 MG/ML IV SOLN
INTRAVENOUS | Status: DC | PRN
  Administered 2017-11-07: 10 mL via INTRA_ARTERIAL

## 2017-11-07 MED ORDER — CLOPIDOGREL BISULFATE 75 MG PO TABS
75.0000 mg | ORAL_TABLET | Freq: Every day | ORAL | Status: DC
  Administered 2017-11-08: 75 mg via ORAL
  Filled 2017-11-07: qty 1

## 2017-11-07 MED ORDER — IOPAMIDOL (ISOVUE-370) INJECTION 76%
INTRAVENOUS | Status: AC
  Filled 2017-11-07: qty 50

## 2017-11-07 MED ORDER — HYDRALAZINE HCL 20 MG/ML IJ SOLN: 5.0000 mg | INTRAMUSCULAR | Status: AC | PRN

## 2017-11-07 MED ORDER — ROSUVASTATIN CALCIUM 40 MG PO TABS
40.0000 mg | ORAL_TABLET | Freq: Every day | ORAL | Status: DC
  Administered 2017-11-07 – 2017-11-08 (×2): 40 mg via ORAL
  Filled 2017-11-07: qty 2
  Filled 2017-11-07 (×2): qty 1
  Filled 2017-11-07: qty 2

## 2017-11-07 MED ORDER — LORATADINE 10 MG PO TABS: 10.0000 mg | ORAL_TABLET | Freq: Every day | ORAL | Status: DC | PRN

## 2017-11-07 MED ORDER — METOPROLOL SUCCINATE ER 50 MG PO TB24: 50.0000 mg | ORAL_TABLET | Freq: Every day | ORAL | Status: DC

## 2017-11-07 SURGICAL SUPPLY — 30 items
BALLN SAPPHIRE 3.0X15 (BALLOONS) ×2
BALLN ~~LOC~~ EMERGE MR 4.5X12 (BALLOONS) ×2
BALLOON SAPPHIRE 3.0X15 (BALLOONS) ×1 IMPLANT
BALLOON ~~LOC~~ EMERGE MR 4.5X12 (BALLOONS) ×1 IMPLANT
BAND CMPR LRG ZPHR (HEMOSTASIS) ×1
BAND ZEPHYR COMPRESS 30 LONG (HEMOSTASIS) ×2 IMPLANT
CATH BALLN WEDGE 5F 110CM (CATHETERS) ×2 IMPLANT
CATH INFINITI 5 FR JL3.5 (CATHETERS) ×2 IMPLANT
CATH INFINITI 5FR AL1 (CATHETERS) ×2 IMPLANT
CATH INFINITI 5FR ANG PIGTAIL (CATHETERS) IMPLANT
CATH INFINITI JR4 5F (CATHETERS) ×2 IMPLANT
CATH LAUNCHER 5F RADL (CATHETERS) ×2 IMPLANT
CATH LAUNCHER 6FR JR4 (CATHETERS) ×2 IMPLANT
CATH VISTA GUIDE 6FR XB4 (CATHETERS) ×2 IMPLANT
CATH VISTA GUIDE 6FR XBLAD4 (CATHETERS) ×2 IMPLANT
CATHETER LAUNCHER 5F RADL (CATHETERS) ×4
GLIDESHEATH SLEND A-KIT 6F 22G (SHEATH) ×2 IMPLANT
GUIDEWIRE INQWIRE 1.5J.035X260 (WIRE) ×1 IMPLANT
INQWIRE 1.5J .035X260CM (WIRE) ×2
KIT ENCORE 26 ADVANTAGE (KITS) ×2 IMPLANT
KIT HEART LEFT (KITS) ×2 IMPLANT
KIT HEMO VALVE WATCHDOG (MISCELLANEOUS) ×2 IMPLANT
PACK CARDIAC CATHETERIZATION (CUSTOM PROCEDURE TRAY) ×2 IMPLANT
SHEATH GLIDE SLENDER 4/5FR (SHEATH) ×4 IMPLANT
STENT SYNERGY DES 4X20 (Permanent Stent) ×2 IMPLANT
TRANSDUCER W/STOPCOCK (MISCELLANEOUS) ×2 IMPLANT
TUBING CIL FLEX 10 FLL-RA (TUBING) ×2 IMPLANT
WIRE EMERALD 3MM-J .025X260CM (WIRE) ×2 IMPLANT
WIRE HI TORQ BMW 190CM (WIRE) ×2 IMPLANT
WIRE MARVEL STR TIP 190CM (WIRE) ×2 IMPLANT

## 2017-11-07 NOTE — Interval H&P Note (Signed)
History and Physical Interval Note:  11/07/2017 11:44 AM  [name]  has presented today for surgery, with the diagnosis of acute on chronic combined systolic and diastolic heart failure with newly reduced EF on echo. The various methods of treatment have been discussed with the patient and family. After consideration of risks, benefits and other options for treatment, the patient has consented to  Procedure(s): RIGHT/LEFT HEART CATH AND CORONARY ANGIOGRAPHY (N/A) with possible PERCUTANEOUS CORONARY INTERVENTION as a surgical intervention .  The patient's history has been reviewed, patient examined, no change in status, stable for surgery.  I have reviewed the patient's chart and labs.  Questions were answered to the patient's satisfaction.    Cath Lab Visit (complete for each Cath Lab visit)  Clinical Evaluation Leading to the Procedure:   ACS: No.  Non-ACS:    Anginal Classification: CCS III - CHF  Anti-ischemic medical therapy: Minimal Therapy (1 class of medications)  Non-Invasive Test Results: Intermediate-risk stress test findings: cardiac mortality 1-3%/year - newly reduced LVEF on Echo  Prior CABG: No previous CABG   Glenetta Hew

## 2017-11-07 NOTE — Progress Notes (Signed)
[facility] BAND REMOVAL  LOCATION:    right radial  DEFLATED PER PROTOCOL:    Yes.    TIME BAND OFF / DRESSING APPLIED:    1830   SITE UPON ARRIVAL:    Level 0  SITE AFTER BAND REMOVAL:    Level 0  CIRCULATION SENSATION AND MOVEMENT:    Within Normal Limits   Yes.    COMMENTS:   Tolerated procedure well

## 2017-11-08 ENCOUNTER — Encounter (HOSPITAL_COMMUNITY): Payer: Self-pay | Admitting: Cardiology

## 2017-11-08 ENCOUNTER — Telehealth (HOSPITAL_COMMUNITY): Payer: Self-pay | Admitting: *Deleted

## 2017-11-08 DIAGNOSIS — Z885 Allergy status to narcotic agent status: Secondary | ICD-10-CM | POA: Diagnosis not present

## 2017-11-08 DIAGNOSIS — I2510 Atherosclerotic heart disease of native coronary artery without angina pectoris: Secondary | ICD-10-CM | POA: Diagnosis not present

## 2017-11-08 DIAGNOSIS — Z7901 Long term (current) use of anticoagulants: Secondary | ICD-10-CM | POA: Diagnosis not present

## 2017-11-08 DIAGNOSIS — I110 Hypertensive heart disease with heart failure: Secondary | ICD-10-CM | POA: Diagnosis not present

## 2017-11-08 DIAGNOSIS — I2729 Other secondary pulmonary hypertension: Secondary | ICD-10-CM | POA: Diagnosis not present

## 2017-11-08 DIAGNOSIS — I25119 Atherosclerotic heart disease of native coronary artery with unspecified angina pectoris: Secondary | ICD-10-CM | POA: Diagnosis not present

## 2017-11-08 DIAGNOSIS — Z880 Allergy status to penicillin: Secondary | ICD-10-CM | POA: Diagnosis not present

## 2017-11-08 DIAGNOSIS — I5042 Chronic combined systolic (congestive) and diastolic (congestive) heart failure: Secondary | ICD-10-CM | POA: Diagnosis not present

## 2017-11-08 DIAGNOSIS — E785 Hyperlipidemia, unspecified: Secondary | ICD-10-CM | POA: Diagnosis not present

## 2017-11-08 DIAGNOSIS — I480 Paroxysmal atrial fibrillation: Secondary | ICD-10-CM | POA: Diagnosis not present

## 2017-11-08 DIAGNOSIS — I481 Persistent atrial fibrillation: Secondary | ICD-10-CM | POA: Diagnosis not present

## 2017-11-08 DIAGNOSIS — I420 Dilated cardiomyopathy: Secondary | ICD-10-CM | POA: Diagnosis not present

## 2017-11-08 DIAGNOSIS — Z79899 Other long term (current) drug therapy: Secondary | ICD-10-CM | POA: Diagnosis not present

## 2017-11-08 LAB — CBC
HEMATOCRIT: 46.6 % (ref 39.0–52.0)
Hemoglobin: 15.4 g/dL (ref 13.0–17.0)
MCH: 26.6 pg (ref 26.0–34.0)
MCHC: 33 g/dL (ref 30.0–36.0)
MCV: 80.6 fL (ref 78.0–100.0)
Platelets: 168 10*3/uL (ref 150–400)
RBC: 5.78 MIL/uL (ref 4.22–5.81)
RDW: 14.6 % (ref 11.5–15.5)
WBC: 10.8 10*3/uL — ABNORMAL HIGH (ref 4.0–10.5)

## 2017-11-08 LAB — BASIC METABOLIC PANEL
ANION GAP: 11 (ref 5–15)
Anion gap: 10 (ref 5–15)
BUN: 28 mg/dL — AB (ref 6–20)
BUN: 30 mg/dL — AB (ref 6–20)
CALCIUM: 9.5 mg/dL (ref 8.9–10.3)
CHLORIDE: 103 mmol/L (ref 101–111)
CO2: 24 mmol/L (ref 22–32)
CO2: 24 mmol/L (ref 22–32)
Calcium: 9.3 mg/dL (ref 8.9–10.3)
Chloride: 102 mmol/L (ref 101–111)
Creatinine, Ser: 1.12 mg/dL (ref 0.61–1.24)
Creatinine, Ser: 1.2 mg/dL (ref 0.61–1.24)
GFR calc Af Amer: >60 mL/min (ref >60)
GFR calc Af Amer: >60 mL/min (ref >60)
GFR calc non Af Amer: >60 mL/min (ref >60)
GFR calc non Af Amer: 55 mL/min — ABNORMAL LOW (ref >60)
GLUCOSE: 93 mg/dL (ref 65–99)
GLUCOSE: 93 mg/dL (ref 65–99)
POTASSIUM: 4.3 mmol/L (ref 3.5–5.1)
Potassium: 4.3 mmol/L (ref 3.5–5.1)
Sodium: 137 mmol/L (ref 135–145)
Sodium: 137 mmol/L (ref 135–145)

## 2017-11-08 MED ORDER — FUROSEMIDE 10 MG/ML IJ SOLN
20.0000 mg | Freq: Once | INTRAMUSCULAR | Status: AC
  Administered 2017-11-08: 11:00:00 20 mg via INTRAVENOUS
  Filled 2017-11-08: qty 2

## 2017-11-08 MED ORDER — METOPROLOL TARTRATE 5 MG/5ML IV SOLN
5.0000 mg | Freq: Once | INTRAVENOUS | Status: DC
  Filled 2017-11-08: qty 5

## 2017-11-08 MED ORDER — METOPROLOL SUCCINATE ER 50 MG PO TB24
75.0000 mg | ORAL_TABLET | Freq: Every day | ORAL | Status: DC
  Administered 2017-11-08: 09:00:00 75 mg via ORAL
  Filled 2017-11-08: qty 1

## 2017-11-08 MED ORDER — ASPIRIN 81 MG PO CHEW: 81.0000 mg | CHEWABLE_TABLET | Freq: Every day | ORAL | Status: DC

## 2017-11-08 MED ORDER — CLOPIDOGREL BISULFATE 75 MG PO TABS: 75.0000 mg | ORAL_TABLET | Freq: Every day | ORAL | 2 refills | Status: DC

## 2017-11-08 MED ORDER — FUROSEMIDE 40 MG PO TABS: 40.0000 mg | ORAL_TABLET | Freq: Every day | ORAL | 3 refills | Status: DC

## 2017-11-08 MED ORDER — PANTOPRAZOLE SODIUM 40 MG PO TBEC: 40.0000 mg | DELAYED_RELEASE_TABLET | Freq: Every day | ORAL | 1 refills | Status: DC

## 2017-11-08 MED ORDER — METOPROLOL SUCCINATE ER 50 MG PO TB24: 100.0000 mg | ORAL_TABLET | Freq: Every day | ORAL | Status: DC

## 2017-11-08 MED ORDER — METOPROLOL SUCCINATE ER 100 MG PO TB24: 100.0000 mg | ORAL_TABLET | Freq: Every day | ORAL | 3 refills | Status: DC

## 2017-11-08 MED ORDER — FUROSEMIDE 40 MG PO TABS: 40.0000 mg | ORAL_TABLET | Freq: Every day | ORAL | Status: DC

## 2017-11-08 MED FILL — Lidocaine HCl Local Inj 1%: INTRAMUSCULAR | Qty: 20 | Status: AC

## 2017-11-08 MED FILL — Heparin Sodium (Porcine) 2 Unit/ML in Sodium Chloride 0.9%: INTRAMUSCULAR | Qty: 1000 | Status: AC

## 2017-11-08 NOTE — Progress Notes (Signed)
CARDIAC REHAB PHASE I   PRE:  Rate/Rhythm: 102 afib PVCs  BP:  Supine:   Sitting: 126/81  Standing:    SaO2: 98%RA  MODE:  Ambulation: 200 ft   POST:  Rate/Rhythm: highest 141 afib   Mainly 120's afib with PVCs  92 in room sitting  BP:  Supine:   Sitting: 156/96  Standing:    SaO2: 99%RA 9628-3662 Did pt's ed first so that beta blocker would have more time to be effective. Heart rate to high 140's with walk of 200 ft. Pt with back pain during walk which I think made heart rate even higher. 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 Reviewed importance of plavix with stent. Reviewed NTG use, CHF zones and importance of daily weights and 2000 mg sodium restriction. Gave CHF booklet and heart healthy diet also. Will refer to [place] CRP 2. Pt has stationary bike he can use. Encouraged him to wait about a week and decrease resistance and begin slow adhering to endpoints of ex like palpitations and increased SOB.   Graylon Good, RN BSN  11/08/2017 10:08 AM

## 2017-11-08 NOTE — Progress Notes (Addendum)
The patient has been seen in conjunction with Kathyrn Drown, NP-C. All aspects of care have been considered and discussed. The patient has been personally interviewed, examined, and all clinical data has been reviewed.   A. fib with rapid ventricular response.  Beta-blocker dose is being increased.  Agree with increasing Toprol dose.  Reviewed digital images and also hemodynamic data.  Patient is not adequately volume controlled.  Agree that furosemide should be increased to at least 40 mg once daily.  Not currently ready for discharge but will reassess earlier this afternoon and make final decision.  We will give a dose of IV Lasix this morning in addition to the 20 mg that he is received orally.   Progress Note  Patient Name: [name] Date of Encounter: 11/08/2017  Primary Cardiologist: Dr. Fransico Him  Subjective   Pt feeling good this AM. Right radial cath site unremarkable. Pt having frequent PVCs on tele review with elevated HR with ambulation. Increased Toprol to 75mg  PO QD. Denies chest pain overnight and this AM. Was having trouble breathing ivernight while lying flat>>will need to increased OP Lasix>>20mg  PO BID?  Inpatient Medications    Scheduled Meds: . apixaban  5 mg Oral BID  . aspirin  81 mg Oral Daily  . clopidogrel  75 mg Oral Q breakfast  . ezetimibe  10 mg Oral Daily  . furosemide  20 mg Oral Daily  . metoprolol succinate  50 mg Oral Daily  . pantoprazole  40 mg Oral Daily  . rosuvastatin  40 mg Oral Daily  . sodium chloride flush  3 mL Intravenous Q12H   Continuous Infusions: . sodium chloride     PRN Meds: sodium chloride, acetaminophen, alum & mag hydroxide-simeth, loratadine, ondansetron (ZOFRAN) IV, polyethylene glycol, sodium chloride flush   Vital Signs    Vitals:   11/07/17 2000 11/07/17 2033 11/08/17 0358 11/08/17 0400  BP:  125/85 (!) 147/76 (!) 147/76  Pulse: (!) 103 85 (!) 102 61  Resp: (!) 21 17 (!) 21 19  Temp:  97.6 F  (36.4 C) 97.6 F (36.4 C)   TempSrc:  Oral Oral   SpO2: 98% 97% 96% 97%  Weight:   245 lb 13 oz (111.5 kg)   Height:        Intake/Output Summary (Last 24 hours) at 11/08/2017 0813 Last data filed at 11/08/2017 0441 Gross per 24 hour  Intake 100 ml  Output 2750 ml  Net -2650 ml   Filed Weights   11/07/17 0831 11/08/17 0358  Weight: 245 lb (111.1 kg) 245 lb 13 oz (111.5 kg)    Physical Exam   General: Well developed, well nourished, NAD Skin: Warm, dry, intact  Head: Normocephalic, atraumatic, clear, moist mucus membranes. Neck: Negative for carotid bruits. No JVD Lungs:Clear to ausculation bilaterally. No wheezes, rales, or rhonchi. Breathing is unlabored. Cardiovascular: Irregularly irregular with S1 S2. No murmurs, rubs, or gallops Abdomen: Soft, non-tender, non-distended with normoactive bowel sounds.  No obvious abdominal masses. MSK: Strength and tone appear normal for age. 5/5 in all extremities Extremities: Mild 1+ LE edema. No clubbing or cyanosis. DP/PT pulses 2+ bilaterally Neuro: Alert and oriented. No focal deficits. No facial asymmetry. MAE spontaneously. Psych: Responds to questions appropriately with normal affect.    Labs    Chemistry Recent Labs  Lab 11/07/17 0853 11/08/17 0420  NA  --  137  K 4.6 4.3  CL  --  103  CO2  --  24  GLUCOSE  --  93  BUN  --  28*  CREATININE  --  1.12  CALCIUM  --  9.3  GFRNONAA  --  >60  GFRAA  --  >60  ANIONGAP  --  10     Hematology Recent Labs  Lab 11/08/17 0420  WBC 10.8*  RBC 5.78  HGB 15.4  HCT 46.6  MCV 80.6  MCH 26.6  MCHC 33.0  RDW 14.6  PLT 168    Cardiac EnzymesNo results for input(s): TROPONINI in the last 168 hours. No results for input(s): TROPIPOC in the last 168 hours.   BNPNo results for input(s): BNP, PROBNP in the last 168 hours.   DDimer No results for input(s): DDIMER in the last 168 hours.   Radiology    No results found.  Telemetry    11/08/17 Atrial fibrillation with  frequent PVCs - Personally Reviewed  ECG    11/08/17 Atrial fibrillation with frequent PVCs - Personally Reviewed  Cardiac Studies   Cardiac cath 11/07/17:  RPDA lesion is 100% stenosed. -Fills via left to right collaterals as well as RPDA distal artery marginal  Ost 2nd Diag lesion is 85% stenosed. chronic  CULPRIT LESION: Mid RCA lesion is 99% stenosed.  A drug-eluting stent was successfully placed using a STENT SYNERGY DES 4X20. --> Postdilated to 4.7 mm  Post intervention, there is a 0% residual stenosis. Mid LAD lesion is 30% stenosed.  Prox LAD to Mid LAD lesion is 30% stenosed.  Prox Cx to Dist Cx lesion is 30% stenosed with 30% stenosed side branch in Ost 1st Mrg.  There is moderate to severe left ventricular systolic dysfunction. The left ventricular ejection fraction is 25-35% by visual estimate.  LV end diastolic pressure is severely elevated.  Hemodynamic findings consistent with moderate pulmonary hypertension. --> With EDP of 29, and PCWP of closer to 40, there is likely a combined component   Severe 99% mid RCA stenosis -treated successfully with a single DES stent Otherwise stable mild disease elsewhere.  There does appear to be chronically occluded PDA that fills from collaterals in the distal RV marginal branch.  Severe cardiomyopathy with reduced ejection fraction and elevated LVEDP.  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  Persistent atrial fibrillation.  Plan:  Overnight observation for monitoring post PCI.  TR band removal per protocol.  Gentle IV fluid hydration with planned IV Lasix this afternoon. --> May require increased oral Lasix in the outpatient setting.   We will restart Eliquis tonight at 8 PM  Plan would be aspirin plus Plavix along with Eliquis for 1 month and then stop aspirin after 1 month.  Would not use Mobic while on triple therapy. ->  Would continue Plavix plus Eliquis to complete 1 year and then  okay to discontinue   Echocardiogram  10/20/17 Study Conclusions  - Left ventricle: The cavity size was moderately dilated. There was mild concentric hypertrophy. Systolic function was moderately reduced. The estimated ejection fraction was in the range of 35% to 40%. Diffuse hypokinesis. The study was not technically sufficient to allow evaluation of LV diastolic dysfunction due to atrial fibrillation. - Aortic valve: Trileaflet; mildly thickened, mildly calcified leaflets. There was mild regurgitation. - Aortic root: The aortic root was dilated measuring 46 mm. - Ascending aorta: The ascending aorta was normal size. - Mitral valve: Calcified annulus. Mildly thickened leaflets . There was mild regurgitation. - Left atrium: The atrium was severely dilated. - Right ventricle: Systolic function was normal. -  Right atrium: The atrium was moderately dilated. - Tricuspid valve: There was mild regurgitation. - Pulmonary arteries: Systolic pressure was within the normal range.  Impressions:  - When compared to the prior study from 11/25/2016 LVEF has decreased to 35-40% with diffuse hypokinesis.  Patient Profile     83 y.o. male with a  hx of PAF s/p DCCV 10/2014, dilated aortic root at (4.4 cm) and ascending aorta (4.0 cm)on most recent assessment. He has h/o systolic HF, however his most recent echo showed normalization of EF at 50-55% (previously 40- 45%). He has alsoASCAD by cath with30% LM at the ostium, 70-80% ostial D2, 30-40% prox LCX, 30-40% prox and 40% mid RCA and is on medical management.He has mild OSA and ison CPAPtherapy and reports full compliance. He is on chronic anticoagulation therapy with Eliquis. He was previously on amiodarone for his afib, however this was discontinued 10/2016 due to intolerances (visual disturbances and bilateral hand numbness). He has considered Tikosyn and plans are to admitted later in April for loading.   Cardia cath  performed 11/07/17 with new DES stent placed to a 99% occlusion in the RCA.   Assessment & Plan    1. Cardiomyopathy: -Last echocardiogram 10/20/17 with moderately reduced LVF with EF 35-40%, mild AR, moderately dilated aortic root at 75mm, mild MR as comp[ared to previous echo with an LVEF of 50-55% -Pt with borderline CAD on cath 3 years ago>>therefore, Dr. Radford Pax recommended cardiac cath for possible revascularization is progression of CAD -Cardiac cath performed 11/07/17 with severe 99% mid RCA stenosis>>treated successfully with a single DES stent and otherwise stable mild disease elsewhere -Pt on home dose of PO Lasix 20mg  QD>>>will need to increase this to at least 20 mg PO BID for LE swelling  -Creatinine, 1.12, stable near baseline of 1.2 -Weight, 245lb>>weight on admission 245lb -I&O, net negative 2.6L since admission, total output 2.7L -Orthopnea overnight>>will need to increase Lasix to 20 mg PO BID from 20 mg PO QD  2. CAD: -Cardiac cath performed 11/07/17 with severe 99% mid RCA stenosis>>treated successfully with a single DES stent and otherwise stable mild disease elsewhere -Will restart Eliquis with plans to continue aspirin plus Plavix along with Eliquis for 1 month and then stop aspirin after 1 month and continue Plavix plus Eliquis to complete 1 year and then okay to discontinue per MD -Pt HR remains elevated overnight with rates in the 110-120's when ambulating with frequent PVC's -Increased his Toprol-XL to 75mg  PO QD -Anticoagulation plans are described as above -Continue statin  3. Persistent atrial fibrillation: -Remains in atrial fibrillation with elevated rates with ambulation and frequent PVCs -Electrolytes WNL, K=4.3, Mg=1.9 -Increased Toprol-XL to 75mg  PO QD  -Pt states that he has plans to be re-admitted to the hospital on 11/14/17 for a Tikosyn loading/initiation arranged by Afib clinic -TEE prior to initiation secondary to held Eliquis prior to  cath???   4. Dilated aortic root: -Per echocardiogram on 10/20/17 with moderately dilated aortic root at 32mm, mild increase form previous echo study  -Will continue to monitor as OP   Signed, Kathyrn Drown NP-C [place] Pager: [phone] 11/08/2017, 8:13 AM     For questions or updates, please contact   Please consult [email or web address] for contact info under Cardiology/STEMI.

## 2017-11-08 NOTE — Discharge Summary (Addendum)
The patient has been seen in conjunction with [name], [name]. All aspects of care have been considered and discussed. The patient has been personally interviewed, examined, and all clinical data has been reviewed.   Patient underwent successful PCI on high-grade mid RCA stenosis with DES deployment.  Atrial fibrillation with frequent premature ventricular contractions, as previously noted on Holter monitor from 2016 when in atrial fibrillation.  He is scheduled to have dofetilide and started within the next 7 days via the atrial fibrillation clinic.  We will further increase the patient's beta-blocker therapy to have better rate control.  Access site is unremarkable.  Additional IV Lasix was given earlier today and we have increased furosemide to 40 mg daily.  Will need laboratory data performed prior to dofetilide initiation.  Discharge Summary    Patient ID: [name],  MRN: [id number], DOB/AGE: 04-Nov-1934 83 y.o.  Admit date: 11/07/2017 Discharge date: 11/08/2017  Primary Care Provider: Lajean Snow Primary Cardiologist: [name] Him, MD  Discharge Diagnoses    Principal Problem:   DCM (dilated cardiomyopathy) ([place]) Active Problems:   Persistent atrial fibrillation ([place])   Benign essential HTN   Chronic anticoagulation-Eliquis   Obesity (BMI 30-39.9)   Coronary artery disease involving native coronary artery of native heart with angina pectoris (HCC)   Chronic combined systolic and diastolic CHF (congestive heart failure) (HCC)   Hyperlipidemia LDL goal <70   OSA (obstructive sleep apnea)   CAD S/P percutaneous coronary angioplasty  Allergies Allergies  Allergen Reactions  . Simvastatin Other (See Comments)    Muscle pain elevated CPK  . Codeine Phosphate Other (See Comments)    constipation  . Penicillins Rash    NOT AN ALLERGY PER PT, thinks the needle used dirty    Diagnostic Studies/Procedures    Cardiac catheterization 11/07/17:   RPDA  lesion is 100% stenosed. -Fills via left to right collaterals as well as RPDA distal artery marginal  Ost 2nd Diag lesion is 85% stenosed. chronic  CULPRIT LESION: Mid RCA lesion is 99% stenosed.  A drug-eluting stent was successfully placed using a STENT SYNERGY DES 4X20. --> Postdilated to 4.7 mm  Post intervention, there is a 0% residual stenosis. Mid LAD lesion is 30% stenosed.  Prox LAD to Mid LAD lesion is 30% stenosed.  Prox Cx to Dist Cx lesion is 30% stenosed with 30% stenosed side branch in Ost 1st Mrg.  There is moderate to severe left ventricular systolic dysfunction. The left ventricular ejection fraction is 25-35% by visual estimate.  LV end diastolic pressure is severely elevated.  Hemodynamic findings consistent with moderate pulmonary hypertension. --> With EDP of 29, and PCWP of closer to 40, there is likely a combined component   Severe 99% mid RCA stenosis -treated successfully with a single DES stent Otherwise stable mild disease elsewhere.  There does appear to be chronically occluded PDA that fills from collaterals in the distal RV marginal branch.  Severe cardiomyopathy with reduced ejection fraction and elevated LVEDP.  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  Persistent atrial fibrillation.  Plan:  Overnight observation for monitoring post PCI.  TR band removal per protocol.  Gentle IV fluid hydration with planned IV Lasix this afternoon. --> May require increased oral Lasix in the outpatient setting.   We will restart Eliquis tonight at 8 PM  Plan would be aspirin plus Plavix along with Eliquis for 1 month and then stop aspirin after 1 month.  Would not use Mobic  while on triple therapy. ->  Would continue Plavix plus Eliquis to complete 1 year and then okay to discontinue   History of Present Illness     [name] is a 83 y.o. male, followed by Dr. Radford Pax who presented to clinic on 11/07/17 to discuss R/LHC,  recommended by Dr. Radford Pax, given recent abnormal echo showing reduced LVEF, down to 30-35% from 50-55%.  [name] has a PMH which includes PAF s/p DCCV 10/2014, dilated aortic root at (4.4 cm) and ascending aorta (4.0 cm)on most recent assessment, chronic combined systolic and diastolic CHF.  He also has hx of ASCAD by cath with30% LM at the ostium, 70-80% ostial D2, 30-40% prox LCX, 30-40% prox and 40% mid RCA and is on medical management.He has mild OSA and ison CPAPtherapy and reports full compliance. He is on chronic anticoagulation therapy with Eliquis. He was previously on amiodarone for his afib, however this was discontinued 10/2016 due to intolerances (visual disturbances and bilateral hand numbness). He has been considered for Tikosyn and plans are to admitted early April for loading. Afib clinic with phone note encounter stating they will be in touch to have tikosyn admission postponed once he is discharged from the hospital.    Pt was recently seen by Dr. Radford Pax for his yearly f/u on 10/20/17 with c/o  exertional dyspnea.  A BNP was obtained and was elevated at 1,530. His Lasix was adjusted. Pt was encouraged to stop NSAIDs (Mobic for OA). An Echo was also ordered and showed moderately reduced LVF with EF 35-40%, mild AR, moderately dilated aortic root at 88mm, mild MR. His EF has declined since last echo (previously 50-55%) and aortic root has increased in size (60mm before). Pt had borderline CAD per cath 3 years ago, thus Dr. Radford Pax has recommended [facility] to r/o worsening CAD.   Hospital Course    Pt was admitted to [facility] on 11/07/2017 with plans for a cardiac cath for further evaluation. Cath report revealed RPDA lesion is 100% stenosed with collaterals, chronically stenosed Ost 2nd Diag lesion is 85%, and CULPRIT LESION= Mid RCA lesion is 99% stenosed. A DES 4x20 was placed with 0% residual. LVEF was visually measured at 25-35%. There was evidence of pulmonary HTN with EDP of 29 and  PCWP close to 40.   Medication plan: Eliquis restarted with plans for aspirin plus Plavix along with Eliquis for 1 month (started 11/08/17) and then stop aspirin after 1 month. Would not use Mobic while on triple therapy. Would continue Plavix plus Eliquis to complete 1 year and then okay to discontinue.   One day post cath, pt telemetry revealed AFib with   frequent PVCs. Pt beta blocker increased from 50mg  PO QD to 100mg  PO QD. He was also given one dose of IV Lasix 20mg  with the addition of his home dose of 20mg  PO. Upon discharge, we will plan to increased his home Lasix to 40mg  PO QD secondary to increased BLE swelling and ongoing c/o orthopnea. His Lasix has been adjusted in the OP setting many times in hopes to find the right balance for Snow. His renal function will need continuing monitoring.   MD is aware of his PVC's. Electrolytes WNL (K+=4.3). Dr. Tamala Julian completed chart review which revealed similar ectopy type tracings in the past. Pt is completley asymptomatic. Will set Snow up for close follow up at discharge.   Consultants: None  The pt has been seen and examined by Dr. Tamala Julian who feels that he is stable  and ready for discharge today 11/08/17.  _____________  Discharge Vitals Blood pressure 119/75, pulse 85, temperature 97.8 F (36.6 C), temperature source Oral, resp. rate 18, height 6' (1.829 m), weight 245 lb 13 oz (111.5 kg), SpO2 96 %.  Filed Weights   11/07/17 0831 11/08/17 0358  Weight: 245 lb (111.1 kg) 245 lb 13 oz (111.5 kg)    Labs & Radiologic Studies    CBC Recent Labs    11/08/17 0420  WBC 10.8*  HGB 15.4  HCT 46.6  MCV 80.6  PLT 638   Basic Metabolic Panel Recent Labs    11/07/17 1613 11/08/17 0420 11/08/17 1225  NA  --  137 137  K  --  4.3 4.3  CL  --  103 102  CO2  --  24 24  GLUCOSE  --  93 93  BUN  --  28* 30*  CREATININE  --  1.12 1.20  CALCIUM  --  9.3 9.5  MG 1.9  --   --    Disposition   Pt is being discharged home today in good  condition.  Follow-up Plans & Appointments   Follow-up Information    Charlie Pitter, PA-C Follow up on 11/22/2017.   Specialties:  Cardiology, Radiology Why:  Your follow up appointment is on 11/22/17 at 1030am with Melina Copa, PA with Dr. Radford Pax. Please arrive to your appointment by 1015am. Thank you  Contact information: [address] [place] [address] [phone]          Discharge Instructions    AMB Referral to Cardiac Rehabilitation - Phase II   Complete by:  As directed    Diagnosis:   Coronary Stents Stable Angina     Amb Referral to Cardiac Rehabilitation   Complete by:  As directed    Diagnosis:  Coronary Stents   Call MD for:  redness, tenderness, or signs of infection (pain, swelling, redness, odor or green/yellow discharge around incision site)   Complete by:  As directed    Call MD for:  severe uncontrolled pain   Complete by:  As directed    Call MD for:  temperature >100.4   Complete by:  As directed    Diet - low sodium heart healthy   Complete by:  As directed    Discharge instructions   Complete by:  As directed    No driving for 2 days. No lifting over 5 lbs for 1 week. No sexual activity for 1 week. Keep procedure site clean & dry. If you notice increased pain, swelling, bleeding or pus, call/return!  You may shower, but no soaking baths/hot tubs/pools for 1 week.   You will be on Plavix, Aspirin, and Eliquis for one month. Monitor for bleeding in your urine or stool. Please contact our office if bleeding occurs.   PLEASE DO NOT MISS ANY DOSES OF YOUR PLAVIX!!!!! Also keep a log of you blood pressures and bring back to your follow up appt. Please call the office with any questions.   Patients taking blood thinners should generally stay away from medicines like ibuprofen, Advil, Motrin, naproxen, and Aleve due to risk of stomach bleeding. You may take Tylenol as directed or talk to your primary doctor about alternatives.  Some  studies suggest Prilosec interacts with Plavix. We changed your Prilosec to the equivalent dose of Protonix for less chance of interaction.   Increase activity slowly   Complete by:  As directed  Discharge Medications   Allergies as of 11/08/2017      Reactions   Simvastatin Other (See Comments)   Muscle pain elevated CPK   Codeine Phosphate Other (See Comments)   constipation   Penicillins Rash   NOT AN ALLERGY PER PT, thinks the needle used dirty      Medication List    STOP taking these medications   meloxicam 15 MG tablet Commonly known as:  MOBIC   omeprazole 20 MG capsule Commonly known as:  PRILOSEC Replaced by:  pantoprazole 40 MG tablet     TAKE these medications   acetaminophen 650 MG CR tablet Commonly known as:  TYLENOL Take 650 mg by mouth every 8 (eight) hours as needed for pain.   apixaban 5 MG Tabs tablet Commonly known as:  ELIQUIS Take 1 tablet (5 mg total) by mouth 2 (two) times daily.   aspirin 81 MG chewable tablet Chew 1 tablet (81 mg total) by mouth daily. Start taking on:  11/09/2017   clopidogrel 75 MG tablet Commonly known as:  PLAVIX Take 1 tablet (75 mg total) by mouth daily with breakfast. Start taking on:  11/09/2017   ezetimibe 10 MG tablet Commonly known as:  ZETIA Take 1 tablet (10 mg total) by mouth daily.   furosemide 40 MG tablet Commonly known as:  LASIX Take 1 tablet (40 mg total) by mouth daily. Start taking on:  11/09/2017 What changed:    medication strength  how much to take  additional instructions   loratadine 10 MG tablet Commonly known as:  CLARITIN Take 10 mg by mouth daily as needed for allergies.   metoprolol succinate 100 MG 24 hr tablet Commonly known as:  TOPROL-XL Take 1 tablet (100 mg total) by mouth daily. Take with or immediately following a meal. Start taking on:  11/09/2017 What changed:    medication strength  how much to take   MULTIPLE VITAMINS PO Take 1 tablet by mouth daily.     pantoprazole 40 MG tablet Commonly known as:  PROTONIX Take 1 tablet (40 mg total) by mouth daily. Start taking on:  11/09/2017 Replaces:  omeprazole 20 MG capsule   polyethylene glycol packet Commonly known as:  MIRALAX / GLYCOLAX Take 17 g by mouth daily as needed (constipation).   rosuvastatin 40 MG tablet Commonly known as:  CRESTOR Take 1 tablet (40 mg total) by mouth daily.        Outstanding Labs/Studies   BMET given increase in home Lasix  Duration of Discharge Encounter   Greater than 30 minutes including physician time.  SignedKathyrn Drown NP 11/08/2017, 5:04 PM

## 2017-11-08 NOTE — Telephone Encounter (Signed)
-----   Message from Sueanne Margarita, MD sent at 11/08/2017  8:30 AM EDT ----- Yes will need to be postponed until he has been on NOAC for 4 weeks  Fransico Him ----- Message ----- From: Juluis Mire, RN Sent: 11/07/2017   2:50 PM To: Sueanne Margarita, MD  Hi Dr. Radford Pax - I wanted to touch base in regards to patients timing of tikosyn admission which is currently scheduled for 4/1. With newly placed stent as of today --  Roderic Palau wanted me to reach out to you to get your opinion on this. Do you recommend postponing admit for a few weeks? Thank you Curtistine Pettitt RN [facility]

## 2017-11-08 NOTE — Telephone Encounter (Signed)
Will be in touch with patient to have tikosyn admission postponed once he is discharged from the hospital.

## 2017-11-09 NOTE — Telephone Encounter (Signed)
Rescheduled tikosyn admit for May 14th

## 2017-11-10 ENCOUNTER — Telehealth (HOSPITAL_COMMUNITY): Payer: Self-pay

## 2017-11-10 NOTE — Telephone Encounter (Signed)
Patients insurance is active and benefits verified through [employer] - $10.00 co-pay, no deductible, out of pocket amount of $3,100/$200.00 has been met, no co-insurance, and no pre-authorization is required. Reference [id number]  Patient will be contacted for scheduling upon review by the RN Navigator.

## 2017-11-11 ENCOUNTER — Encounter: Payer: Self-pay | Admitting: Cardiology

## 2017-11-14 ENCOUNTER — Ambulatory Visit (HOSPITAL_COMMUNITY): Payer: PPO | Admitting: Nurse Practitioner

## 2017-11-15 DIAGNOSIS — M7751 Other enthesopathy of right foot: Secondary | ICD-10-CM | POA: Diagnosis not present

## 2017-11-16 DIAGNOSIS — G4733 Obstructive sleep apnea (adult) (pediatric): Secondary | ICD-10-CM | POA: Diagnosis not present

## 2017-11-16 DIAGNOSIS — Z9660 Presence of unspecified orthopedic joint implant: Secondary | ICD-10-CM | POA: Diagnosis not present

## 2017-11-18 ENCOUNTER — Other Ambulatory Visit: Payer: Self-pay

## 2017-11-18 DIAGNOSIS — E875 Hyperkalemia: Secondary | ICD-10-CM

## 2017-11-18 NOTE — Progress Notes (Signed)
Patient stated he would get labs drawn during his AF clinic appt on 11/14/17. I spoke with patient and he did not get labs drawn. Patient stated he could get labs drawn on 11/22/17 during his OV with Melina Copa, PA. Patient scheduled for BMET on 11/22/17. Patient verbalized understanding and thankful for the call.

## 2017-11-22 ENCOUNTER — Other Ambulatory Visit: Payer: PPO

## 2017-11-22 ENCOUNTER — Ambulatory Visit: Payer: PPO | Admitting: Physician Assistant

## 2017-11-23 ENCOUNTER — Other Ambulatory Visit: Payer: PPO | Admitting: *Deleted

## 2017-11-23 ENCOUNTER — Encounter: Payer: Self-pay | Admitting: *Deleted

## 2017-11-23 ENCOUNTER — Encounter: Payer: Self-pay | Admitting: Cardiology

## 2017-11-23 ENCOUNTER — Encounter (INDEPENDENT_AMBULATORY_CARE_PROVIDER_SITE_OTHER): Payer: Self-pay

## 2017-11-23 ENCOUNTER — Telehealth: Payer: Self-pay | Admitting: *Deleted

## 2017-11-23 ENCOUNTER — Ambulatory Visit: Payer: PPO | Admitting: Cardiology

## 2017-11-23 VITALS — BP 124/74 | HR 71 | Ht 72.0 in | Wt 242.8 lb

## 2017-11-23 DIAGNOSIS — I10 Essential (primary) hypertension: Secondary | ICD-10-CM

## 2017-11-23 DIAGNOSIS — G4733 Obstructive sleep apnea (adult) (pediatric): Secondary | ICD-10-CM

## 2017-11-23 DIAGNOSIS — I77810 Thoracic aortic ectasia: Secondary | ICD-10-CM | POA: Diagnosis not present

## 2017-11-23 DIAGNOSIS — I481 Persistent atrial fibrillation: Secondary | ICD-10-CM

## 2017-11-23 DIAGNOSIS — E875 Hyperkalemia: Secondary | ICD-10-CM | POA: Diagnosis not present

## 2017-11-23 DIAGNOSIS — I420 Dilated cardiomyopathy: Secondary | ICD-10-CM | POA: Diagnosis not present

## 2017-11-23 DIAGNOSIS — I4819 Other persistent atrial fibrillation: Secondary | ICD-10-CM

## 2017-11-23 DIAGNOSIS — I25119 Atherosclerotic heart disease of native coronary artery with unspecified angina pectoris: Secondary | ICD-10-CM | POA: Diagnosis not present

## 2017-11-23 DIAGNOSIS — E785 Hyperlipidemia, unspecified: Secondary | ICD-10-CM | POA: Diagnosis not present

## 2017-11-23 DIAGNOSIS — I5042 Chronic combined systolic (congestive) and diastolic (congestive) heart failure: Secondary | ICD-10-CM | POA: Diagnosis not present

## 2017-11-23 MED ORDER — SACUBITRIL-VALSARTAN 24-26 MG PO TABS: 1.0000 | ORAL_TABLET | Freq: Two times a day (BID) | ORAL | 0 refills | Status: DC

## 2017-11-23 MED ORDER — SPIRONOLACTONE 25 MG PO TABS: 25.0000 mg | ORAL_TABLET | Freq: Every day | ORAL | 3 refills | Status: DC

## 2017-11-23 NOTE — Telephone Encounter (Signed)
Informed patient of titration results and verbalized understanding was indicated.  Patient seen in office and discussed titration. Orders in and sent to precert.

## 2017-11-23 NOTE — Progress Notes (Signed)
Cardiology Office Note:    Date:  11/23/2017   ID:  [name], DOB 1934/10/10, MRN [id number]  PCP:  Lajean Manes, MD  Cardiologist:  Fransico Him, MD    Referring MD: Lajean Manes, MD   Chief Complaint  Patient presents with  . Coronary Artery Disease  . Hypertension  . Atrial Fibrillation  . Hyperlipidemia  . Sleep Apnea    History of Present Illness:    [name] is a 83 y.o. male with a hx of PAF s/p DCCV 10/2014, dilated aortic root at (4.4 cm) and ascending aorta (4.0 cm)on most recent assessment. He has h/o systolic HF, however his most recent echo showed normalization of EF at 50-55% (previously 40- 45%). He has alsoASCAD by cath with30% LM at the ostium, 70-80% ostial D2, 30-40% prox LCX, 30-40% prox and 40% mid RCA and is on medical management. He is on chronic anticoagulation therapy with Eliquis. He was previously on amiodarone for his afib, however this was discontinued 10/2016 due to intolerances (visual disturbances and bilateral hand numbness). He has considered Tikosyn and plans are to admitted later in April for loading.    He saw me a few weeks ago complaining of significant exertional dyspnea.  BNP was elevated at 1530 and his Lasix was adjusted.  He takes chronic Mobic for osteoarthritis and he was encouraged to stop this but stated he could not do this.  2D echocardiogram was ordered which showed a decline in EF with moderate reduced left ventricular function EF 35-40% and moderately dilated aortic root at 46 mm.  Given that he had borderline CAD 3 years prior it was recommended he undergo a right and left heart catheterization.  Cath showed an occluded RPDA which filled via left to right collaterals, 85% chronic stenosis of O D2, 99% mid RCA, 30% proximal to mid LAD, 30% proximal to distal left circumflex and moderate LV dysfunction with EF 25-35%.  He underwent PCI with drug-eluting stent to the mid RCA.  He also was noted to have moderate pulmonary  hypertension with an LVEDP of 29 and a pulmonary capillary wedge closure to 40.  It was felt that he likely had a combination of group 2 and group 3 pulmonary hypertension secondary to obstructive sleep apnea as well as congestive heart failure with pulmonary venous hypertension.  He is now back for follow-up today.  He says that his shortness of breath has improved which he thinks is from picking out all the fluid he is retaining.  He denies any chest pain or pressure.  He still has some dyspnea on exertion when going up stairs.  He denies any lower extremity edema, dizziness, palpitations.  He is still taking aspirin and Plavix as well as Eliquis.  It was recommended that after 1 month he stop his aspirin which will be on 12/08/2017.  Also of note he was found to not be adequately treated on CPAP therapy and it was recommended he come in for an in lab BiPAP titration although he is not real thrilled about using his Pap device.  He says is just one more thing he has to do.    Past Medical History:  Diagnosis Date  . Benign essential HTN 09/17/2014  . CAD (coronary artery disease), native coronary artery 09/20/2014   30% LM at the ostium, 70-80% ostial D2, 30-40% prox LCX, 30-40% prox and 40% mid RCA and EF 40%. 11/07/17 DES to RCA   . Chronic lower back pain   .  DCM (dilated cardiomyopathy) ([place]) 09/17/2014   a.  EF 40%; b. Echo 5/16:  EF 40-45%, diff HK, inf AK, mild AI, MAC, severe LAE  . Dilated aortic root ([place]) 09/17/2014   normal diameter on echo 2017  . DJD (degenerative joint disease)    feet, knees and back  . GERD (gastroesophageal reflux disease)    hiatal hernia  . Hepatitis 1953   "A or B; not sure which"  . High cholesterol   . History of blood transfusion 1954   "related to hip OR"  . History of hip surgery 1954   "knocked hips out of joints; had to have bone grafts; both sides"  . Hyperlipidemia LDL goal <70 10/14/2015  . Lesion of left femoral nerve    3 cm MRI 04/2010  consistent with hemangioma  . Mild aortic insufficiency   . Obesity   . OSA (obstructive sleep apnea) 10/14/2015   Mild with AHI 10.6/hr overall and 38/hr during REM sleep.  On CPAP at 9cm H2O  . OSA on CPAP   . Osteoarthritis    "all over" (11/07/2017)  . PAF (paroxysmal atrial fibrillation) ([place]) 09/17/2014  . PSA (psoriatic arthritis) ([facility])    Mild increase 3.65 on 05/2011 stable 3.68 on 09/2011  . Renal cyst, left 2010   5 cm seen on ultrasound     Past Surgical History:  Procedure Laterality Date  . APPENDECTOMY    . BACK SURGERY    . CARDIOVERSION N/A 11/04/2014   Procedure: CARDIOVERSION;  Surgeon: Sueanne Margarita, MD;  Location: [facility] ENDOSCOPY;  Service: Cardiovascular;  Laterality: N/A;  . CARDIOVERSION N/A 01/09/2015   Procedure: CARDIOVERSION;  Surgeon: Pixie Casino, MD;  Location: [facility] ENDOSCOPY;  Service: Cardiovascular;  Laterality: N/A;  . CATARACT EXTRACTION W/ INTRAOCULAR LENS  IMPLANT, BILATERAL Bilateral   . CORONARY STENT INTERVENTION N/A 11/07/2017   Procedure: CORONARY STENT INTERVENTION;  Surgeon: Leonie Man, MD;  Location: [facility] CV LAB;  Service: Cardiovascular;  Laterality: N/A;  . HERNIA REPAIR    . JOINT REPLACEMENT    . LAPAROSCOPIC CHOLECYSTECTOMY    . LEFT HEART CATHETERIZATION WITH CORONARY ANGIOGRAM N/A 10/01/2014   Procedure: LEFT HEART CATHETERIZATION WITH CORONARY ANGIOGRAM;  Surgeon: Troy Sine, MD;  Location: [facility] CATH LAB;  Service: Cardiovascular;  Laterality: N/A;  . LUMBAR LAMINECTOMY    . REVISION TOTAL HIP ARTHROPLASTY Right   . RIGHT/LEFT HEART CATH AND CORONARY ANGIOGRAPHY N/A 11/07/2017   Procedure: RIGHT/LEFT HEART CATH AND CORONARY ANGIOGRAPHY;  Surgeon: Leonie Man, MD;  Location: [facility] CV LAB;  Service: Cardiovascular;  Laterality: N/A;  . SHOULDER OPEN ROTATOR CUFF REPAIR Bilateral   . TONSILLECTOMY    . TOTAL HIP ARTHROPLASTY Bilateral   . TOTAL KNEE ARTHROPLASTY Bilateral   . UMBILICAL HERNIA REPAIR       Current Medications: Current Meds  Medication Sig  . acetaminophen (TYLENOL) 650 MG CR tablet Take 650 mg by mouth every 8 (eight) hours as needed for pain.  Marland Kitchen apixaban (ELIQUIS) 5 MG TABS tablet Take 1 tablet (5 mg total) by mouth 2 (two) times daily.  Marland Kitchen aspirin 81 MG chewable tablet Chew 1 tablet (81 mg total) by mouth daily.  . clopidogrel (PLAVIX) 75 MG tablet Take 1 tablet (75 mg total) by mouth daily with breakfast.  . ezetimibe (ZETIA) 10 MG tablet Take 1 tablet (10 mg total) by mouth daily.  . furosemide (LASIX) 40 MG tablet Take 1 tablet (40 mg total) by  mouth daily.  Marland Kitchen loratadine (CLARITIN) 10 MG tablet Take 10 mg by mouth daily as needed for allergies.  . metoprolol succinate (TOPROL-XL) 100 MG 24 hr tablet Take 1 tablet (100 mg total) by mouth daily. Take with or immediately following a meal.  . MULTIPLE VITAMINS PO Take 1 tablet by mouth daily.  . pantoprazole (PROTONIX) 40 MG tablet Take 1 tablet (40 mg total) by mouth daily.  . polyethylene glycol (MIRALAX / GLYCOLAX) packet Take 17 g by mouth daily as needed (constipation).   . rosuvastatin (CRESTOR) 40 MG tablet Take 1 tablet (40 mg total) by mouth daily.     Allergies:   Simvastatin; Codeine phosphate; and Penicillins   Social History   Socioeconomic History  . Marital status: Married    Spouse name: Not on file  . Number of children: 4  . Years of education: Not on file  . Highest education level: Not on file  Occupational History  . Occupation: Naval architect  Social Needs  . Financial resource strain: Not on file  . Food insecurity:    Worry: Not on file    Inability: Not on file  . Transportation needs:    Medical: Not on file    Non-medical: Not on file  Tobacco Use  . Smoking status: Former Smoker    Packs/day: 1.00    Years: 30.00    Pack years: 30.00    Types: Cigarettes  . Smokeless tobacco: Never Used  . Tobacco comment: "quit smoking in the 1980s"  Substance and Sexual Activity  .  Alcohol use: No  . Drug use: No  . Sexual activity: Yes  Lifestyle  . Physical activity:    Days per week: Not on file    Minutes per session: Not on file  . Stress: Not on file  Relationships  . Social connections:    Talks on phone: Not on file    Gets together: Not on file    Attends religious service: Not on file    Active member of club or organization: Not on file    Attends meetings of clubs or organizations: Not on file    Relationship status: Not on file  Other Topics Concern  . Not on file  Social History Narrative  . Not on file     Family History: The patient's family history includes Atrial fibrillation in his sister; Cancer in his mother; Diabetes in his father; Heart attack in his maternal grandmother, paternal grandfather, and paternal grandmother; Heart disease in his sister; Stroke in his maternal grandfather.  ROS:   Please see the history of present illness.    ROS  All other systems reviewed and negative.   EKGs/Labs/Other Studies Reviewed:    The following studies were reviewed today: Cardiac cath and hospital notes  EKG:  EKG is not ordered today.   Recent Labs: 09/01/2017: TSH 1.810 10/20/2017: ALT 27; NT-Pro BNP 1,530 11/07/2017: Magnesium 1.9 11/08/2017: BUN 30; Creatinine, Ser 1.20; Hemoglobin 15.4; Platelets 168; Potassium 4.3; Sodium 137   Recent Lipid Panel    Component Value Date/Time   CHOL 149 10/20/2017 1220   TRIG 96 10/20/2017 1220   HDL 49 10/20/2017 1220   CHOLHDL 3.0 10/20/2017 1220   CHOLHDL 2.5 05/10/2016 1145   VLDL 19 05/10/2016 1145   LDLCALC 81 10/20/2017 1220    Physical Exam:    VS:  BP 124/74   Pulse 71   Ht 6' (1.829 m)   Wt 242 lb 12.8 oz (  110.1 kg)   SpO2 98%   BMI 32.93 kg/m     Wt Readings from Last 3 Encounters:  11/23/17 242 lb 12.8 oz (110.1 kg)  11/08/17 245 lb 13 oz (111.5 kg)  11/01/17 255 lb (115.7 kg)     GEN:  Well nourished, well developed in no acute distress HEENT: Normal NECK: No JVD;  No carotid bruits LYMPHATICS: No lymphadenopathy CARDIAC: RRR, no murmurs, rubs, gallops RESPIRATORY:  Clear to auscultation without rales, wheezing or rhonchi  ABDOMEN: Soft, non-tender, non-distended MUSCULOSKELETAL:  No edema; No deformity  SKIN: Warm and dry NEUROLOGIC:  Alert and oriented x 3 PSYCHIATRIC:  Normal affect   ASSESSMENT:    1. Persistent atrial fibrillation ([place])   2. DCM (dilated cardiomyopathy) ([place])   3. Coronary artery disease involving native coronary artery of native heart with angina pectoris ([place])   4. Chronic combined systolic and diastolic CHF (congestive heart failure) ([place])   5. Benign essential HTN   6. Dilated aortic root ([place])   7. CAD S/P percutaneous coronary angioplasty   8. OSA (obstructive sleep apnea)   9. Hyperlipidemia LDL goal <70    PLAN:    In order of problems listed above:  1.  Persistent atrial fibrillation - he remains in atrial fibrillation today with controlled ventricular response.  He is back on Eliquis 5 mg twice daily after cath.  He is being followed in A. fib clinic and is actually set up to be admitted for Tikosyn loading next month.  I again tried to stress the importance to him of getting adequately treated on his Pap therapy or he will likely have recurrence of atrial fibrillation even with anti-rhythmic drug therapy on board.  He will continue on apixaban 5 mg twice daily and Toprol-XL 100 mg daily.  He will follow-up in A. fib clinic for plan to take as an load.  2.  DCM -EF cath to be 25-35% and by echo 85-40% with diffuse hypokinesis.  3.  ASCAD - Cath showed an occluded RPDA which filled via left to right collaterals, 85% chronic stenosis of O D2, 99% mid RCA, 30% proximal to mid LAD, 30% proximal to distal left circumflex and moderate LV dysfunction with EF 25-35%.  He underwent PCI with drug-eluting stent to the mid RCA.  He thinks his breathing has improved since his stent.  He has no anginal chest pain.  He will  continue on triple therapy with aspirin 81 mg daily, Plavix 75 mg daily and Eliquis 5 mill grams twice daily until 12/09/2014 then he has been instructed to stop his aspirin.  I also encouraged him not to use Mobic due to the need for NOAC for afib as well as aspirin therapy for his CAD.  He will continue statin therapy as well as beta-blockers.  4.  Chronic combined systolic/diastolic CHF -he does appear euvolemic on exam today with no lower extremity edema and lungs are clear.  Since a few weeks ago he is actually lost 13 pounds.  He will continue on Lasix 40 mg daily, Toprol-XL 100 mg daily.  I am going to start him on Entresto 24-26 mg twice daily.  I will have a bmet checked in a week.  We will have him follow-up in hypertension clinic in 3 weeks for up titration of his Entresto.  I will also add Aldactone 12.5 mg daily.  5.  Dilated aortic root at 46 mm by recent 2D echocardiogram.  Chest CT angios 09/30/2017 showed dilatation of  the aorta at the sinuses of Valsalva at 4.4 cm and 4.2 cm in the ascending thoracic aorta.  This will need to be followed on a yearly basis.  I will repeat echo in February 2020.  His blood pressures well controlled and he will continue on statin therapy.  5.  HTN -blood pressure is well controlled on exam today.  Continue on Toprol-XL 100 mg daily.  6.  OSA - the patient is tolerating PAP therapy well without any problems. The PAP download was reviewed today and showed an AHI of 4.6/hr on 9 cm H2O with 63% compliance in using more than 4 hours nightly.  The patient has been using and benefiting from PAP use and will continue to benefit from therapy.  I encouraged him to be more compliant with his his advice and told him that he needs to use his device at least 8 hours a night to adequately treat his sleep apnea and prevent further episodes of atrial fibrillation once he is cardioverted back to sinus rhythm.  He had a recent download which showed an AHI of 8.5 and we had placed  him on auto titration showing an AHI of 11 and recommended BiPAP titration.  He had a repeat download done today which shows an AHI of 4.6 on 9 cm H2O so we will cancel the BiPAP titration at this point since he appears adequately titrated.  6.  Hyperlpidemia with LDL goal < 70.  He will continue on Crestor 40 mg daily.  His LDL was 81 on 3 02/14/2018 and he was started on Zetia 10 mg daily.  He has another FLP and ALT pending in May.    Medication Adjustments/Labs and Tests Ordered: Current medicines are reviewed at length with the patient today.  Concerns regarding medicines are outlined above.  No orders of the defined types were placed in this encounter.  No orders of the defined types were placed in this encounter.   Signed, Fransico Him, MD  11/23/2017 4:00 PM    [place]

## 2017-11-23 NOTE — Addendum Note (Signed)
Addended by: Freada Bergeron on: 11/23/2017 03:33 PM   Modules accepted: Orders

## 2017-11-23 NOTE — Telephone Encounter (Signed)
-----   Message from Sueanne Margarita, MD sent at 11/18/2017 11:25 PM EDT ----- Patient not adequately treated on CPAP and could not be titrated after 2 week autotitration - please refer to sleep lab for BiPAP titration

## 2017-11-23 NOTE — Patient Instructions (Addendum)
Medication Instructions:  Your physician has recommended you make the following change in your medication:  STOP aspirin on 12/08/17  START: Aldactone 12.5 mg once a day START Entresto 24-26mg  two times a day   If you need a refill on your cardiac medications, please contact your pharmacy first.  Labwork: Your physician recommends that you return for lab work on 12/01/17 for kidney function test   Testing/Procedures: None ordered   Follow-Up: Your physician recommends that you schedule a follow-up appointment in the hypertension clinic on 12/19/17 at [facility].   Your physician wants you to follow-up in: 6 months with Dr. Radford Pax.  You will receive a reminder letter in the mail two months in advance. If you don't receive a letter, please call our office to schedule the follow-up appointment.  Any Other Special Instructions Will Be Listed Below (If Applicable).   Thank you for choosing [facility], RN  [phone]  If you need a refill on your cardiac medications before your next appointment, please call your pharmacy.

## 2017-11-23 NOTE — Addendum Note (Signed)
Addended by: Teressa Senter on: 11/23/2017 04:43 PM   Modules accepted: Orders

## 2017-11-24 ENCOUNTER — Telehealth: Payer: Self-pay

## 2017-11-24 LAB — BASIC METABOLIC PANEL
BUN/Creatinine Ratio: 20 (ref 10–24)
BUN: 30 mg/dL — ABNORMAL HIGH (ref 8–27)
CO2: 26 mmol/L (ref 20–29)
CREATININE: 1.52 mg/dL — AB (ref 0.76–1.27)
Calcium: 9.8 mg/dL (ref 8.6–10.2)
Chloride: 97 mmol/L (ref 96–106)
GFR calc Af Amer: 49 mL/min/{1.73_m2} — ABNORMAL LOW (ref >59)
GFR calc non Af Amer: 43 mL/min/{1.73_m2} — ABNORMAL LOW (ref >59)
Glucose: 107 mg/dL — ABNORMAL HIGH (ref 65–99)
POTASSIUM: 5.3 mmol/L — AB (ref 3.5–5.2)
SODIUM: 139 mmol/L (ref 134–144)

## 2017-11-24 MED ORDER — FUROSEMIDE 20 MG PO TABS: 20.0000 mg | ORAL_TABLET | Freq: Every day | ORAL | 3 refills | Status: DC

## 2017-11-24 NOTE — Telephone Encounter (Signed)
Per Dr Radford Pax BiPAP Titration cancelled for patient after review of his two most recent downloads.

## 2017-11-24 NOTE — Telephone Encounter (Signed)
Notes recorded by Teressa Senter, RN on 11/24/2017 at 2:19 PM EDT Patient made aware of lab results and Dr. Theodosia Blender recommendation to decrease Lasix to 20 mg once a day. Patient scheduled for repeat BMET on 12/01/17. Patient in agreement with treatment plan and thankful for the call   Notes recorded by Sueanne Margarita, MD on 11/24/2017 at 8:26 AM EDT Creatinine has bumped - decrease Lasix to 20mg  daily and repeat BMET in 1 week

## 2017-11-25 ENCOUNTER — Telehealth: Payer: Self-pay

## 2017-11-25 ENCOUNTER — Telehealth (HOSPITAL_COMMUNITY): Payer: Self-pay

## 2017-11-25 NOTE — Telephone Encounter (Signed)
Attempted to call patient in regards to Cardiac rehab - lm on vm

## 2017-11-25 NOTE — Telephone Encounter (Signed)
Patient returned phone call and stated he is not interested in the program as patient currently exercises at home. Patient stated he is too busy to have a set schedule. Closed referral.

## 2017-11-25 NOTE — Telephone Encounter (Signed)
**Note De-identified [name] Obfuscation** I have done an Entresto PA through covermymeds. 

## 2017-11-28 NOTE — Telephone Encounter (Signed)
Entresto approval received from [employer]. Approval good until 08/15/2018.  I have notified the pts pharmacy.

## 2017-11-30 ENCOUNTER — Telehealth: Payer: Self-pay | Admitting: Pharmacist

## 2017-11-30 NOTE — Telephone Encounter (Signed)
Medication list reviewed in anticipation of upcoming Tikosyn initiation. Patient is not taking any contraindicated or QTc prolonging medications.   Patient is anticoagulated on Eliquis 5mg  BID - would recommend rechecking BMET. SCr most recently increased to 1.52 and pt is 83 years old which would qualify him for lower dose of Eliquis 2.5mg  BID. However, previous SCr were all < 1.5 - if redraw is again < 1.5 then pt can continue on Eliquis 5mg  BID. Please ensure that patient has not missed any anticoagulation doses in the 3 weeks prior to Tikosyn initiation.   Patient will need to be counseled to avoid use of Benadryl while on Tikosyn and in the 2-3 days prior to Tikosyn initiation.

## 2017-12-01 ENCOUNTER — Other Ambulatory Visit: Payer: PPO

## 2017-12-01 DIAGNOSIS — I5042 Chronic combined systolic (congestive) and diastolic (congestive) heart failure: Secondary | ICD-10-CM | POA: Diagnosis not present

## 2017-12-02 LAB — BASIC METABOLIC PANEL
BUN/Creatinine Ratio: 23 (ref 10–24)
BUN: 29 mg/dL — ABNORMAL HIGH (ref 8–27)
CALCIUM: 9.8 mg/dL (ref 8.6–10.2)
CO2: 27 mmol/L (ref 20–29)
CREATININE: 1.28 mg/dL — AB (ref 0.76–1.27)
Chloride: 99 mmol/L (ref 96–106)
GFR calc Af Amer: 61 mL/min/{1.73_m2} (ref >59)
GFR, EST NON AFRICAN AMERICAN: 53 mL/min/{1.73_m2} — AB (ref >59)
GLUCOSE: 72 mg/dL (ref 65–99)
Potassium: 5.3 mmol/L — ABNORMAL HIGH (ref 3.5–5.2)
SODIUM: 142 mmol/L (ref 134–144)

## 2017-12-02 NOTE — Telephone Encounter (Signed)
SCr has improved on BMET yesterday and is back to < 1.5, ok to continue current dose of Eliquis 5mg  BID.

## 2017-12-12 ENCOUNTER — Telehealth: Payer: Self-pay | Admitting: Cardiology

## 2017-12-12 MED ORDER — METOPROLOL SUCCINATE ER 25 MG PO TB24: 25.0000 mg | ORAL_TABLET | Freq: Every day | ORAL | 11 refills | Status: DC

## 2017-12-12 NOTE — Telephone Encounter (Signed)
I spoke with patient and instructed patient to DECREASE Toprol to 25 mg once a day. Monitor blood pressure and HR for the next week and call back with blood pressure readings. Patient verbalized understanding and thankful for the call.

## 2017-12-12 NOTE — Telephone Encounter (Signed)
I spoke with patient. He states for the past few weeks he has felt very fatigue and sleepy. Patient states if he is sitting for an extended time he will fall asleep. Patient was started on Entresto 24-26 mg BID and aldactone 12.5 mg daily on 11/23/17. His BP this morning was 90/51. Patient denies dizziness, lightheadedness, or syncope. Informed patient I would forwarded to Dr. Radford Pax for further recommendations. Patient verbalized understanding and thankful for the call.

## 2017-12-12 NOTE — Telephone Encounter (Signed)
Decrease Toprol to 25mg  daily and check BP daily for a week and call with results.  If he starts to feel worse he needs to call back.

## 2017-12-12 NOTE — Telephone Encounter (Signed)
New Message   Patient is requesting to speak with the nurse in reference to the medication that he takes. I did ask what medication he said he takes many so he wants to discuss all. I asked is he having a reaction he said he does not know. Please call.

## 2017-12-18 NOTE — Progress Notes (Signed)
Patient ID: [name]                 DOB: 1935-04-20                      MRN: [id number]     HPI: [name] is a 83 y.o. male patient of Dr.[name] who presents today for medication optimization. PMH significant for persistent atrial fibrillation, ASCAD s/p DES mid RCA, chronic combined systolic/diastolic CHF with EF 87-56%, dilated aortic root, HTN, HLD, and OSA. At last clinic visit with Dr. Radford Pax on 4/10, patient was started on Entresto 24-26 mg BID and  spironolactone 12.5 mg daily per the notes, however spironolactone 25 mg daily was sent to the pharmacy and patient has been taking this dose. The BMET taken that day showed a Scr of 1.52 and potassium of 5.3. Dr. Radford Pax subsequently lowered his furosemide dose from 40 mg daily to 20 mg daily. Another BMET was obtained on 4/18 showing improvement with Scr at 1.28, but potassium remained elevated at 5.3. On 4/29, the patient called the office with complaints of fatigue, lethargy and a low blood pressure reading of BP 90/51 mmHg. His metoprolol succinate was decreased from 100 mg daily to 25 mg daily. Patient was on DAPT + apixaban for ASCAD with plans to stop the ASA on 12/08/2017. Tentative plans are in place for Tikosyn initiation for cardioversion.   In clinic today, patient still endorses some fatigue which he states improves when he rides his bike every day. He denies dizziness, syncope, palpitations, chest pain, headache, and blurred vision. He states he is tolerating his medications well and denies adverse effects. He reports weighing himself daily (235-237 lbs) with weight remaining stable, denies edema, and limits salt intake. He confirms that he has stopped taking his ASA 81 mg as of 12/08/2017. He reports that he will be cancelling his Tikosyn initiation due to cost concerns. He reports low blood pressure readings at home ranging from 80-115/50-60s mmHg and HR readings in the upper 70s. Patient expresses concern with going into the donut  hole in the next month or so with the cost of Entresto and apixaban.   Current meds: apixaban 5 mg BID, ASA 81 mg daily, clopidogrel 75 mg daily, metoprolol succinate 25 mg daily, furosemide 20 mg daily, Entresto 24-26 mg BID, spironolactone 25 mg daily, ezetimibe 10 mg daily, rosuvastatin 40 mg daily  Previously tried: metoprolol succinate 100 mg daily, furosemide 40 mg daily  Family History: Father DM, CHF (deceased), mother cancer (deceased), paternal and maternal grandparents had CVD  Social History: former smoker, quit >30 years ago, smoked ~1 pack/day. Last few years, he smoked cigar occasionally. He has chewed tobacco in the past. He has stopped drinking alcohol 62 years ago  Diet: Egg and toast in the morning, sandwiches for lunch (subway low fat/low cal), pinto beans, salad and sardines (rinses off the salt), and vegetables for dinner. He uses Mrs. Dash, has cut back on salt, and drinks 2 quarts of water per day   Exercise: rides bicycle outside ~40-50 miles per week  Lipid Panel 10/20/2017: TC 149, TG 96, LDL 81, HDL 49 (on rosuvastatin, ezetimibe added after)  Home BP Pressure/HR Readings: BP 80-115/50-60s mmHg, HR upper 70s  Wt Readings from Last 3 Encounters:  11/23/17 242 lb 12.8 oz (110.1 kg)  11/08/17 245 lb 13 oz (111.5 kg)  11/01/17 255 lb (115.7 kg)   BP Readings from Last 3 Encounters:  12/19/17  110/60  11/23/17 124/74  11/08/17 119/75   Pulse Readings from Last 3 Encounters:  12/19/17 71  11/23/17 71  11/08/17 85    Renal function: CrCl cannot be calculated (Unknown ideal weight.).  Past Medical History:  Diagnosis Date  . Benign essential HTN 09/17/2014  . CAD (coronary artery disease), native coronary artery 09/20/2014   30% LM at the ostium, 70-80% ostial D2, 30-40% prox LCX, 30-40% prox and 40% mid RCA and EF 40%. 11/07/17 DES to RCA   . Chronic lower back pain   . DCM (dilated cardiomyopathy) ([place]) 09/17/2014   a.  EF 40%; b. Echo 5/16:  EF 40-45%, diff  HK, inf AK, mild AI, MAC, severe LAE  . Dilated aortic root ([place]) 09/17/2014   normal diameter on echo 2017  . DJD (degenerative joint disease)    feet, knees and back  . GERD (gastroesophageal reflux disease)    hiatal hernia  . Hepatitis 1953   "A or B; not sure which"  . High cholesterol   . History of blood transfusion 1954   "related to hip OR"  . History of hip surgery 1954   "knocked hips out of joints; had to have bone grafts; both sides"  . Hyperlipidemia LDL goal <70 10/14/2015  . Lesion of left femoral nerve    3 cm MRI 04/2010 consistent with hemangioma  . Mild aortic insufficiency   . Obesity   . OSA (obstructive sleep apnea) 10/14/2015   Mild with AHI 10.6/hr overall and 38/hr during REM sleep.  On CPAP at 9cm H2O  . OSA on CPAP   . Osteoarthritis    "all over" (11/07/2017)  . PAF (paroxysmal atrial fibrillation) ([place]) 09/17/2014  . PSA (psoriatic arthritis) ([facility])    Mild increase 3.65 on 05/2011 stable 3.68 on 09/2011  . Renal cyst, left 2010   5 cm seen on ultrasound     Current Outpatient Medications on File Prior to Visit  Medication Sig Dispense Refill  . acetaminophen (TYLENOL) 650 MG CR tablet Take 650 mg by mouth every 8 (eight) hours as needed for pain.    Marland Kitchen apixaban (ELIQUIS) 5 MG TABS tablet Take 1 tablet (5 mg total) by mouth 2 (two) times daily. 180 tablet 3  . clopidogrel (PLAVIX) 75 MG tablet Take 1 tablet (75 mg total) by mouth daily with breakfast. 90 tablet 2  . ezetimibe (ZETIA) 10 MG tablet Take 1 tablet (10 mg total) by mouth daily. 90 tablet 3  . furosemide (LASIX) 20 MG tablet Take 1 tablet (20 mg total) by mouth daily. 90 tablet 3  . metoprolol succinate (TOPROL XL) 25 MG 24 hr tablet Take 1 tablet (25 mg total) by mouth daily. 30 tablet 11  . MULTIPLE VITAMINS PO Take 1 tablet by mouth daily.    . polyethylene glycol (MIRALAX / GLYCOLAX) packet Take 17 g by mouth daily as needed (constipation).     . rosuvastatin (CRESTOR) 40 MG tablet Take 1  tablet (40 mg total) by mouth daily. 90 tablet 3  . spironolactone (ALDACTONE) 25 MG tablet Take 0.5 tablets (12.5 mg total) by mouth daily. 45 tablet 3  . traMADol (ULTRAM) 50 MG tablet Take 50 mg by mouth every 6 (six) hours as needed for moderate pain.    Marland Kitchen aspirin 81 MG chewable tablet Chew 1 tablet (81 mg total) by mouth daily. (Patient not taking: Reported on 12/19/2017)    . loratadine (CLARITIN) 10 MG tablet Take 10 mg by mouth  daily as needed for allergies.    . pantoprazole (PROTONIX) 40 MG tablet Take 1 tablet (40 mg total) by mouth daily. (Patient not taking: Reported on 12/19/2017) 30 tablet 1   No current facility-administered medications on file prior to visit.     Allergies  Allergen Reactions  . Simvastatin Other (See Comments)    Muscle pain elevated CPK  . Codeine Phosphate Other (See Comments)    constipation  . Penicillins Rash    NOT AN ALLERGY PER PT, thinks the needle used dirty    Blood pressure 110/60, pulse 71, SpO2 99 %.   Assessment/Plan: Medication optimization: Patient's last potassium of 5.3 and low blood pressure readings will limit titration of Entresto at this clinic visit. Will decrease spironolactone to 12.5 mg daily and check a BMET in one week to assess potassium. If potassium is within normal limits and patient's blood pressure increases adequately, will titrate Entresto to 49/51 mg BID. Will continue with current medications as prescribed. If fatigue improves, may also consider increasing metoprolol dose in the future. Gave patient a sample of Entresto in clinic today and a free copay card. Patient to return to pharmacy clinic in 3 weeks for follow-up.   Leroy Libman, PharmD Pharmacy Resident Pager: [phone]

## 2017-12-19 ENCOUNTER — Ambulatory Visit (INDEPENDENT_AMBULATORY_CARE_PROVIDER_SITE_OTHER): Payer: PPO

## 2017-12-19 VITALS — BP 110/60 | HR 71

## 2017-12-19 DIAGNOSIS — I10 Essential (primary) hypertension: Secondary | ICD-10-CM | POA: Diagnosis not present

## 2017-12-19 DIAGNOSIS — I5042 Chronic combined systolic (congestive) and diastolic (congestive) heart failure: Secondary | ICD-10-CM

## 2017-12-19 MED ORDER — SACUBITRIL-VALSARTAN 24-26 MG PO TABS: 1.0000 | ORAL_TABLET | Freq: Two times a day (BID) | ORAL | 11 refills | Status: DC

## 2017-12-19 NOTE — Patient Instructions (Signed)
DECREASE spironolactone dose to 12.5 mg daily.   Continue taking your other medications as prescribed.   A fasting lipid panel and basic metabolic panel have been scheduled for you on Monday, May 13.   Return to pharmcy clinic Tuesday, May 28 at 1:30pm for follow-up.

## 2017-12-21 ENCOUNTER — Telehealth (HOSPITAL_COMMUNITY): Payer: Self-pay | Admitting: *Deleted

## 2017-12-21 NOTE — Telephone Encounter (Signed)
Patient called in stating he has decided not to go through with tikosyn admission. His shortness of breath has slightly improved with stent placement and he just cannot pay for another medication - states he already pays over 1K in medications a month and tikosyn would be almost 500 a month as well. Discussed patient assistance for tikosyn but patient will not qualify since he still works full time and is outside [place] [employer]. Informed patient I would let Dr. Radford Pax know of patient's decision to forego plan of tikosyn at this present time.  Pt does meet requirements for Eliquis assistance and I will forward the paperwork via mail.

## 2017-12-22 ENCOUNTER — Other Ambulatory Visit: Payer: PPO

## 2017-12-26 ENCOUNTER — Other Ambulatory Visit: Payer: PPO

## 2017-12-27 ENCOUNTER — Ambulatory Visit (HOSPITAL_COMMUNITY): Payer: PPO | Admitting: Nurse Practitioner

## 2018-01-02 ENCOUNTER — Other Ambulatory Visit: Payer: Self-pay | Admitting: Cardiology

## 2018-01-03 NOTE — Telephone Encounter (Signed)
Outpatient Medication Detail    Disp Refills Start End   sacubitril-valsartan (ENTRESTO) 24-26 MG 60 tablet 11 12/19/2017    Sig - Route: Take 1 tablet by mouth 2 (two) times daily. - Oral   Sent to pharmacy as: sacubitril-valsartan (ENTRESTO) 24-26 MG   E-Prescribing Status: Receipt confirmed by pharmacy (12/19/2017 1:43 PM EDT)   [place], [place]

## 2018-01-10 ENCOUNTER — Encounter: Payer: Self-pay | Admitting: Pharmacist

## 2018-01-10 ENCOUNTER — Ambulatory Visit (INDEPENDENT_AMBULATORY_CARE_PROVIDER_SITE_OTHER): Payer: PPO | Admitting: Pharmacist

## 2018-01-10 VITALS — BP 96/62 | HR 81

## 2018-01-10 DIAGNOSIS — I10 Essential (primary) hypertension: Secondary | ICD-10-CM | POA: Diagnosis not present

## 2018-01-10 NOTE — Progress Notes (Signed)
Patient ID: [name]                 DOB: 1935-07-08                      MRN: [id number]     HPI: [name] is a 83 y.o. male patient of Dr..[name] who presents today for medication optimization. PMH significant for persistent atrial fibrillation, ASCAD s/p DES mid RCA, chronic combined systolic/diastolic CHF with EF 92-01%, dilated aortic root, HTN, HLD, and OSA. At last clinic visit with Dr. Radford Pax on 4/10, patient was started on Entresto 24-26 mg BID and  spironolactone 12.5 mg daily per the notes, however spironolactone 25 mg daily was sent to the pharmacy and patient has been taking this dose. The BMET taken that day showed a Scr of 1.52 and potassium of 5.3. Dr. Radford Pax subsequently lowered his furosemide dose from 40 mg daily to 20 mg daily. Another BMET was obtained on 4/18 showing improvement with Scr at 1.28, but potassium remained elevated at 5.3. On 4/29, the patient called the office with complaints of fatigue, lethargy and a low blood pressure reading of BP 90/51 mmHg. His metoprolol succinate was decreased from 100 mg daily to 25 mg daily. At his most recent OV his spironolactone was decreased to 12.80m daily due to elevated potassium.   He presents today with his wife for follow up medication management and BMET. He reports that he is not going to come back monthly for blood pressure checks.   He states he is tired often, but this is no worse than before. He denies dizziness and chest pain.  He does develop SOB when he exerts himself, such as climbing stairs or walking long distances. He reports that he has been taking metoprolol 565mdaily   Current meds: metoprolol succinate 50 mg daily, furosemide 20 mg daily, Entresto 24-26 mg BID, spironolactone 12.5 mg daily  Previously tried: metoprolol succinate 100 mg daily, furosemide 40 mg daily  Family History: Father DM, CHF (deceased), mother cancer (deceased), paternal and maternal grandparents had CVD  Social History:  former smoker, quit >30 years ago, smoked ~1 pack/day. Last few years, he smoked cigar occasionally. He has chewed tobacco in the past. He has stopped drinking alcohol 62 years ago  Diet: Egg and toast in the morning, sandwiches for lunch (subway low fat/low cal), pinto beans, salad and sardines (rinses off the salt), and vegetables for dinner. He uses Mrs. Dash, has cut back on salt, and drinks 2 quarts of water per day   Exercise: rides bicycle outside ~40-50 miles per week  Home BP readings: 88-116/54-63  Mostly low 100s/60s  Wt Readings from Last 3 Encounters:  11/23/17 242 lb 12.8 oz (110.1 kg)  11/08/17 245 lb 13 oz (111.5 kg)  11/01/17 255 lb (115.7 kg)   BP Readings from Last 3 Encounters:  01/10/18 96/62  12/19/17 110/60  11/23/17 124/74   Pulse Readings from Last 3 Encounters:  01/10/18 81  12/19/17 71  11/23/17 71    Renal function: CrCl cannot be calculated (Patient's most recent lab result is older than the maximum 21 days allowed.).  Past Medical History:  Diagnosis Date  . Benign essential HTN 09/17/2014  . CAD (coronary artery disease), native coronary artery 09/20/2014   30% LM at the ostium, 70-80% ostial D2, 30-40% prox LCX, 30-40% prox and 40% mid RCA and EF 40%. 11/07/17 DES to RCA   . Chronic lower back  pain   . DCM (dilated cardiomyopathy) ([place]) 09/17/2014   a.  EF 40%; b. Echo 5/16:  EF 40-45%, diff HK, inf AK, mild AI, MAC, severe LAE  . Dilated aortic root ([place]) 09/17/2014   normal diameter on echo 2017  . DJD (degenerative joint disease)    feet, knees and back  . GERD (gastroesophageal reflux disease)    hiatal hernia  . Hepatitis 1953   "A or B; not sure which"  . High cholesterol   . History of blood transfusion 1954   "related to hip OR"  . History of hip surgery 1954   "knocked hips out of joints; had to have bone grafts; both sides"  . Hyperlipidemia LDL goal <70 10/14/2015  . Lesion of left femoral nerve    3 cm MRI 04/2010 consistent  with hemangioma  . Mild aortic insufficiency   . Obesity   . OSA (obstructive sleep apnea) 10/14/2015   Mild with AHI 10.6/hr overall and 38/hr during REM sleep.  On CPAP at 9cm H2O  . OSA on CPAP   . Osteoarthritis    "all over" (11/07/2017)  . PAF (paroxysmal atrial fibrillation) ([place]) 09/17/2014  . PSA (psoriatic arthritis) ([facility])    Mild increase 3.65 on 05/2011 stable 3.68 on 09/2011  . Renal cyst, left 2010   5 cm seen on ultrasound     Current Outpatient Medications on File Prior to Visit  Medication Sig Dispense Refill  . acetaminophen (TYLENOL) 650 MG CR tablet Take 650 mg by mouth every 8 (eight) hours as needed for pain.    Marland Kitchen apixaban (ELIQUIS) 5 MG TABS tablet Take 1 tablet (5 mg total) by mouth 2 (two) times daily. 180 tablet 3  . clopidogrel (PLAVIX) 75 MG tablet Take 1 tablet (75 mg total) by mouth daily with breakfast. 90 tablet 2  . ezetimibe (ZETIA) 10 MG tablet Take 1 tablet (10 mg total) by mouth daily. 90 tablet 3  . furosemide (LASIX) 20 MG tablet Take 1 tablet (20 mg total) by mouth daily. 90 tablet 3  . loratadine (CLARITIN) 10 MG tablet Take 10 mg by mouth daily as needed for allergies.    . metoprolol succinate (TOPROL XL) 25 MG 24 hr tablet Take 1 tablet (25 mg total) by mouth daily. (Patient taking differently: Take 50 mg by mouth daily. ) 30 tablet 11  . MULTIPLE VITAMINS PO Take 1 tablet by mouth daily.    . polyethylene glycol (MIRALAX / GLYCOLAX) packet Take 17 g by mouth daily as needed (constipation).     . rosuvastatin (CRESTOR) 40 MG tablet Take 1 tablet (40 mg total) by mouth daily. 90 tablet 3  . sacubitril-valsartan (ENTRESTO) 24-26 MG Take 1 tablet by mouth 2 (two) times daily. 60 tablet 11  . spironolactone (ALDACTONE) 25 MG tablet Take 0.5 tablets (12.5 mg total) by mouth daily. 45 tablet 3  . traMADol (ULTRAM) 50 MG tablet Take 50 mg by mouth every 6 (six) hours as needed for moderate pain.    . pantoprazole (PROTONIX) 40 MG tablet Take 1 tablet  (40 mg total) by mouth daily. (Patient not taking: Reported on 12/19/2017) 30 tablet 1   No current facility-administered medications on file prior to visit.     Allergies  Allergen Reactions  . Simvastatin Other (See Comments)    Muscle pain elevated CPK  . Codeine Phosphate Other (See Comments)    constipation  . Penicillins Rash    NOT AN ALLERGY PER PT,  thinks the needle used dirty    Blood pressure 96/62, pulse 81.   Assessment/Plan: Hypertension: BMET today. BP today is soft. I am doubtful that his BP will tolerate a dose increase of his [name] thus will continue low dose. He will also continue metoprolol 77m daily, furosemide 222mdaily, spironolactone 12.102m51maily. Samples of Entresto and Eliquis provided today as he states he will go into the donut hole next month. Follow up with Dr. TurRadford Pax scheduled in Sept and pharmacy clinic if needed sooner.   Thank you, KelLelan [facility]  01/10/2018 2:18 PM

## 2018-01-10 NOTE — Patient Instructions (Signed)
Return for a follow up appointment as recommended with Dr. Radford Pax in Sept  Check your blood pressure at home daily (if able) and keep record of the readings.  Take your BP meds as follows: CONTINUE all medications as prescribed  Bring all of your meds, your BP cuff and your record of home blood pressures to your next appointment.  Exercise as you're able, try to walk approximately 30 minutes per day.  Keep salt intake to a minimum, especially watch canned and prepared boxed foods.  Eat more fresh fruits and vegetables and fewer canned items.  Avoid eating in fast food restaurants.    HOW TO TAKE YOUR BLOOD PRESSURE: . Rest 5 minutes before taking your blood pressure. .  Don't smoke or drink caffeinated beverages for at least 30 minutes before. . Take your blood pressure before (not after) you eat. . Sit comfortably with your back supported and both feet on the floor (don't cross your legs). . Elevate your arm to heart level on a table or a desk. . Use the proper sized cuff. It should fit smoothly and snugly around your bare upper arm. There should be enough room to slip a fingertip under the cuff. The bottom edge of the cuff should be 1 inch above the crease of the elbow. . Ideally, take 3 measurements at one sitting and record the average.

## 2018-01-11 LAB — BASIC METABOLIC PANEL
BUN/Creatinine Ratio: 24 (ref 10–24)
BUN: 30 mg/dL — ABNORMAL HIGH (ref 8–27)
CHLORIDE: 104 mmol/L (ref 96–106)
CO2: 23 mmol/L (ref 20–29)
Calcium: 10 mg/dL (ref 8.6–10.2)
Creatinine, Ser: 1.26 mg/dL (ref 0.76–1.27)
GFR calc non Af Amer: 54 mL/min/{1.73_m2} — ABNORMAL LOW (ref >59)
GFR, EST AFRICAN AMERICAN: 62 mL/min/{1.73_m2} (ref >59)
Glucose: 122 mg/dL — ABNORMAL HIGH (ref 65–99)
Potassium: 5.2 mmol/L (ref 3.5–5.2)
SODIUM: 142 mmol/L (ref 134–144)

## 2018-03-08 DIAGNOSIS — I480 Paroxysmal atrial fibrillation: Secondary | ICD-10-CM | POA: Diagnosis not present

## 2018-03-08 DIAGNOSIS — I10 Essential (primary) hypertension: Secondary | ICD-10-CM | POA: Diagnosis not present

## 2018-03-08 DIAGNOSIS — R102 Pelvic and perineal pain: Secondary | ICD-10-CM | POA: Diagnosis not present

## 2018-03-08 DIAGNOSIS — G8929 Other chronic pain: Secondary | ICD-10-CM | POA: Diagnosis not present

## 2018-03-08 DIAGNOSIS — M5440 Lumbago with sciatica, unspecified side: Secondary | ICD-10-CM | POA: Diagnosis not present

## 2018-03-08 DIAGNOSIS — I509 Heart failure, unspecified: Secondary | ICD-10-CM | POA: Diagnosis not present

## 2018-03-30 ENCOUNTER — Encounter: Payer: Self-pay | Admitting: Cardiology

## 2018-04-24 ENCOUNTER — Ambulatory Visit: Payer: PPO | Admitting: Cardiology

## 2018-04-24 ENCOUNTER — Encounter: Payer: Self-pay | Admitting: Cardiology

## 2018-04-24 VITALS — BP 120/70 | HR 66 | Ht 72.0 in | Wt 253.6 lb

## 2018-04-24 DIAGNOSIS — Z79899 Other long term (current) drug therapy: Secondary | ICD-10-CM | POA: Diagnosis not present

## 2018-04-24 DIAGNOSIS — I2510 Atherosclerotic heart disease of native coronary artery without angina pectoris: Secondary | ICD-10-CM

## 2018-04-24 DIAGNOSIS — I5042 Chronic combined systolic (congestive) and diastolic (congestive) heart failure: Secondary | ICD-10-CM | POA: Diagnosis not present

## 2018-04-24 DIAGNOSIS — I77810 Thoracic aortic ectasia: Secondary | ICD-10-CM | POA: Diagnosis not present

## 2018-04-24 DIAGNOSIS — I420 Dilated cardiomyopathy: Secondary | ICD-10-CM

## 2018-04-24 DIAGNOSIS — Z9861 Coronary angioplasty status: Secondary | ICD-10-CM | POA: Diagnosis not present

## 2018-04-24 DIAGNOSIS — G4733 Obstructive sleep apnea (adult) (pediatric): Secondary | ICD-10-CM

## 2018-04-24 DIAGNOSIS — I481 Persistent atrial fibrillation: Secondary | ICD-10-CM

## 2018-04-24 DIAGNOSIS — I10 Essential (primary) hypertension: Secondary | ICD-10-CM | POA: Diagnosis not present

## 2018-04-24 DIAGNOSIS — E785 Hyperlipidemia, unspecified: Secondary | ICD-10-CM

## 2018-04-24 DIAGNOSIS — I4819 Other persistent atrial fibrillation: Secondary | ICD-10-CM

## 2018-04-24 MED ORDER — SACUBITRIL-VALSARTAN 49-51 MG PO TABS: 1.0000 | ORAL_TABLET | Freq: Two times a day (BID) | ORAL | 3 refills | Status: AC

## 2018-04-24 NOTE — Addendum Note (Signed)
Addended by: Sarina Ill on: 04/24/2018 01:46 PM   Modules accepted: Orders

## 2018-04-24 NOTE — Progress Notes (Signed)
Cardiology Office Note:    Date:  04/24/2018   ID:  [name], DOB 10-Mar-1935, MRN [id number]  PCP:  Lajean Manes, MD  Cardiologist:  Fransico Him, MD    Referring MD: Lajean Manes, MD   Chief Complaint  Patient presents with  . Coronary Artery Disease  . Hypertension  . Atrial Fibrillation  . Hyperlipidemia  . Congestive Heart Failure    History of Present Illness:    MAYO FAULK is a 83 y.o. male with a hx of PAF s/p DCCV 10/2014, dilated aortic root at (4.4 cm) and ascending aorta (4.0 cm)on most recent assessment. He has h/o systolic HF, however his most recent echo showed normalization of EF at 50-55% (previously 40- 45%). He has alsoASCAD by cath with30% LM at the ostium, 70-80% ostial D2, 30-40% prox LCX, 30-40% prox and 40% mid RCA and is on medical management. He is on chronic anticoagulation therapy with Eliquis. He was previously on amiodarone for his afib, however this was discontinued 10/2016 due to intolerances (visual disturbances and bilateral hand numbness).    I saw him back in March 2019 he was complaining of increased shortness of breath and BNP was elevated and his diuretics were adjusted. 2D echocardiogram was ordered which showed a decline in EF with moderate reduced left ventricular function EF 35-40% and moderately dilated aortic root at 46 mm.  He underwent cardiac cath showing an occluded RPDA which filled via left to right collaterals, 85% chronic stenosis of oD2, 99% mid RCA, 30% proximal to mid LAD, 30% proximal to distal left circumflex and moderate LV dysfunction with EF 25-35%.  He underwent PCI with drug-eluting stent to the mid RCA.  He also was noted to have moderate pulmonary hypertension with an LVEDP of 29 and a pulmonary capillary wedge closure to 40.  It was felt that he likely had a combination of group 2 and group 3 pulmonary hypertension secondary to obstructive sleep apnea as well as congestive heart failure with pulmonary venous  hypertension.  He is here today for followup and is doing well.  He denies any chest pain or pressure, SOB, DOE, PND, orthopnea, LE edema, dizziness, palpitations or syncope. He is compliant with his meds and is tolerating meds with no SE.    Past Medical History:  Diagnosis Date  . Benign essential HTN 09/17/2014  . CAD (coronary artery disease), native coronary artery 09/20/2014   30% LM at the ostium, 70-80% ostial D2, 30-40% prox LCX, 30-40% prox and 40% mid RCA and EF 40%. 11/07/17 DES to RCA   . Chronic lower back pain   . DCM (dilated cardiomyopathy) ([place]) 09/17/2014   a.  EF 40%; b. Echo 5/16:  EF 40-45%, diff HK, inf AK, mild AI, MAC, severe LAE  . Dilated aortic root ([place]) 09/17/2014   normal diameter on echo 2017  . DJD (degenerative joint disease)    feet, knees and back  . GERD (gastroesophageal reflux disease)    hiatal hernia  . Hepatitis 1953   "A or B; not sure which"  . High cholesterol   . History of blood transfusion 1954   "related to hip OR"  . History of hip surgery 1954   "knocked hips out of joints; had to have bone grafts; both sides"  . Hyperlipidemia LDL goal <70 10/14/2015  . Lesion of left femoral nerve    3 cm MRI 04/2010 consistent with hemangioma  . Mild aortic insufficiency   . Obesity   .  OSA (obstructive sleep apnea) 10/14/2015   Mild with AHI 10.6/hr overall and 38/hr during REM sleep.  On CPAP at 9cm H2O  . OSA on CPAP   . Osteoarthritis    "all over" (11/07/2017)  . PAF (paroxysmal atrial fibrillation) ([place]) 09/17/2014  . PSA (psoriatic arthritis) ([facility])    Mild increase 3.65 on 05/2011 stable 3.68 on 09/2011  . Renal cyst, left 2010   5 cm seen on ultrasound     Past Surgical History:  Procedure Laterality Date  . APPENDECTOMY    . BACK SURGERY    . CARDIOVERSION N/A 11/04/2014   Procedure: CARDIOVERSION;  Surgeon: Sueanne Margarita, MD;  Location[facility] ENDOSCOPY;  Service: Cardiovascular;  Laterality: N/A;  . CARDIOVERSION N/A 01/09/2015    Procedure: CARDIOVERSION;  Surgeon: Pixie Casino, MD;  Location: [facility] ENDOSCOPY;  Service: Cardiovascular;  Laterality: N/A;  . CATARACT EXTRACTION W/ INTRAOCULAR LENS  IMPLANT, BILATERAL Bilateral   . CORONARY STENT INTERVENTION N/A 11/07/2017   Procedure: CORONARY STENT INTERVENTION;  Surgeon: Leonie Man, MD;  Location: [place] CV LAB;  Service: Cardiovascular;  Laterality: N/A;  . HERNIA REPAIR    . JOINT REPLACEMENT    . LAPAROSCOPIC CHOLECYSTECTOMY    . LEFT HEART CATHETERIZATION WITH CORONARY ANGIOGRAM N/A 10/01/2014   Procedure: LEFT HEART CATHETERIZATION WITH CORONARY ANGIOGRAM;  Surgeon: Troy Sine, MD;  Location: [facility] CATH LAB;  Service: Cardiovascular;  Laterality: N/A;  . LUMBAR LAMINECTOMY    . REVISION TOTAL HIP ARTHROPLASTY Right   . RIGHT/LEFT HEART CATH AND CORONARY ANGIOGRAPHY N/A 11/07/2017   Procedure: RIGHT/LEFT HEART CATH AND CORONARY ANGIOGRAPHY;  Surgeon: Leonie Man, MD;  Location: [place] CV LAB;  Service: Cardiovascular;  Laterality: N/A;  . SHOULDER OPEN ROTATOR CUFF REPAIR Bilateral   . TONSILLECTOMY    . TOTAL HIP ARTHROPLASTY Bilateral   . TOTAL KNEE ARTHROPLASTY Bilateral   . UMBILICAL HERNIA REPAIR      Current Medications: Current Meds  Medication Sig  . acetaminophen (TYLENOL) 650 MG CR tablet Take 650 mg by mouth every 8 (eight) hours as needed for pain.  Marland Kitchen apixaban (ELIQUIS) 5 MG TABS tablet Take 1 tablet (5 mg total) by mouth 2 (two) times daily.  . clopidogrel (PLAVIX) 75 MG tablet Take 1 tablet (75 mg total) by mouth daily with breakfast.  . ezetimibe (ZETIA) 10 MG tablet Take 1 tablet (10 mg total) by mouth daily.  . furosemide (LASIX) 20 MG tablet Take 1 tablet (20 mg total) by mouth daily.  Marland Kitchen loratadine (CLARITIN) 10 MG tablet Take 10 mg by mouth daily as needed for allergies.  . metoprolol succinate (TOPROL-XL) 50 MG 24 hr tablet Take 50 mg by mouth daily. Take with or immediately following a meal.  . MULTIPLE VITAMINS PO  Take 1 tablet by mouth daily.  . pantoprazole (PROTONIX) 40 MG tablet Take 1 tablet (40 mg total) by mouth daily.  . polyethylene glycol (MIRALAX / GLYCOLAX) packet Take 17 g by mouth daily as needed (constipation).   . rosuvastatin (CRESTOR) 40 MG tablet Take 1 tablet (40 mg total) by mouth daily.  . sacubitril-valsartan (ENTRESTO) 24-26 MG Take 1 tablet by mouth 2 (two) times daily.  Marland Kitchen spironolactone (ALDACTONE) 25 MG tablet Take 0.5 tablets (12.5 mg total) by mouth daily.  . traMADol (ULTRAM) 50 MG tablet Take 50 mg by mouth every 6 (six) hours as needed for moderate pain.     Allergies:   Simvastatin; Codeine phosphate; and Penicillins  Social History   Socioeconomic History  . Marital status: Married    Spouse name: Not on file  . Number of children: 4  . Years of education: Not on file  . Highest education level: Not on file  Occupational History  . Occupation: Naval architect  Social Needs  . Financial resource strain: Not on file  . Food insecurity:    Worry: Not on file    Inability: Not on file  . Transportation needs:    Medical: Not on file    Non-medical: Not on file  Tobacco Use  . Smoking status: Former Smoker    Packs/day: 1.00    Years: 30.00    Pack years: 30.00    Types: Cigarettes  . Smokeless tobacco: Never Used  . Tobacco comment: "quit smoking in the 1980s"  Substance and Sexual Activity  . Alcohol use: No  . Drug use: No  . Sexual activity: Yes  Lifestyle  . Physical activity:    Days per week: Not on file    Minutes per session: Not on file  . Stress: Not on file  Relationships  . Social connections:    Talks on phone: Not on file    Gets together: Not on file    Attends religious service: Not on file    Active member of club or organization: Not on file    Attends meetings of clubs or organizations: Not on file    Relationship status: Not on file  Other Topics Concern  . Not on file  Social History Narrative  . Not on file      Family History: The patient's family history includes Atrial fibrillation in his sister; Cancer in his mother; Diabetes in his father; Heart attack in his maternal grandmother, paternal grandfather, and paternal grandmother; Heart disease in his sister; Stroke in his maternal grandfather.  ROS:   Please see the history of present illness.    ROS  All other systems reviewed and negative.   EKGs/Labs/Other Studies Reviewed:    The following studies were reviewed today: none  EKG:  EKG is not ordered today.  Recent Labs: 09/01/2017: TSH 1.810 10/20/2017: ALT 27; NT-Pro BNP 1,530 11/07/2017: Magnesium 1.9 11/08/2017: Hemoglobin 15.4; Platelets 168 01/10/2018: BUN 30; Creatinine, Ser 1.26; Potassium 5.2; Sodium 142   Recent Lipid Panel    Component Value Date/Time   CHOL 149 10/20/2017 1220   TRIG 96 10/20/2017 1220   HDL 49 10/20/2017 1220   CHOLHDL 3.0 10/20/2017 1220   CHOLHDL 2.5 05/10/2016 1145   VLDL 19 05/10/2016 1145   LDLCALC 81 10/20/2017 1220    Physical Exam:    VS:  BP 120/70   Pulse 66   Ht 6' (1.829 m)   Wt 253 lb 9.6 oz (115 kg)   SpO2 97%   BMI 34.39 kg/m     Wt Readings from Last 3 Encounters:  04/24/18 253 lb 9.6 oz (115 kg)  11/23/17 242 lb 12.8 oz (110.1 kg)  11/08/17 245 lb 13 oz (111.5 kg)     GEN:  Well nourished, well developed in no acute distress HEENT: Normal NECK: No JVD; No carotid bruits LYMPHATICS: No lymphadenopathy CARDIAC: Irregularly irregular, no murmurs, rubs, gallops RESPIRATORY:  Clear to auscultation without rales, wheezing or rhonchi  ABDOMEN: Soft, non-tender, non-distended MUSCULOSKELETAL:  No edema; No deformity  SKIN: Warm and dry NEUROLOGIC:  Alert and oriented x 3 PSYCHIATRIC:  Normal affect   ASSESSMENT:    1. Persistent atrial fibrillation (  [place])   2. DCM (dilated cardiomyopathy) ([place])   3. CAD S/P percutaneous coronary angioplasty   4. Chronic combined systolic and diastolic CHF (congestive heart failure)  ([place])   5. Dilated aortic root ([place])   6. Benign essential HTN   7. OSA (obstructive sleep apnea)   8. Hyperlipidemia LDL goal <70    PLAN:    In order of problems listed above:  1.  Persistent atrial fibrillation - he remains in atrial fibrillation today with controlled ventricular response.  He decided not to follow through with Tikosyn load and is going to remain in atrial fibrillation since he is asymptomatic.   He is being followed in A. fib clinic.   He will continue on apixaban 5 mg twice daily and Toprol-XL 100 mg daily.    2.  DCM -EF cath to be 25-35% and by echo 35-40% with diffuse hypokinesis.  Repeat heart cath showed an ostial D2 85% lesion, occluded RPDA, 9 9% mid RCA and he underwent PCI DES to the mid RCA in March 2019.  He has not had a reassessment of LV function since then and therefore I will order a 2D echocardiogram to see if EF is improved.  Continue Entresto, spironolactone and beta-blocker.  3.  ASCAD - Cath showed an occluded RPDA which filled via left to right collaterals, 85% chronic stenosis of O D2, 99% mid RCA, 30% proximal to mid LAD, 30% proximal to distal left circumflex and moderate LV dysfunction with EF 25-35% echo showed EF 35 to 40%.Marland Kitchen  He underwent PCI with drug-eluting stent to the mid RCA.    He is not having any anginal symptoms.   Continue Plavix 75 mg daily, Toprol XL 50 mg daily and statin therapy.  He is no longer on aspirin due to being on apixaban as well.  4.  Chronic combined systolic/diastolic CHF -he does appear euvolemic on exam today with no lower extremity edema and lungs are clear.  Echo 10/20/2017 showed EF 35 to 40%.  He will continue on Lasix 20 mg daily, beta-blocker and spironolactone 12.5 mg daily.  Increase Entresto to 49-47m BID.  Check BMET in 1 week.  Creatinine was stable at 1.26 potassium 5.2 on 01/10/2018.  I will repeat an echo once I see him back in 3 months to see if LV function has improved.  5.  Dilated aortic root at 46  mm by echo 11-2017..Marland Kitchen Chest CT angio 09/30/2017 showed dilatation of the aorta at the sinuses of Valsalva at 4.4 cm and 4.2 cm in the ascending thoracic aorta.  This will need to be followed on a yearly basis.  I will repeat echo in February 2020.  His blood pressures well controlled and he will continue on statin therapy.  5.  HTN -blood pressure is well controlled on exam today.  Continue on Toprol-XL 100 mg daily.  6.  OSA - the patient is tolerating PAP therapy well without any problems. The PAP download was reviewed today and showed an AHI of 3.6/hr on 9 cm H2O with 100% compliance in using more than 4 hours nightly.  The patient has been using and benefiting from PAP use and will continue to benefit from therapy.   6.  Hyperlpidemia with LDL goal < 70.  He will continue on Crestor 40 mg daily Zetia 10 mg daily.    His LDL was 81  On 10/20/2017 ALT 27.  I am going to repeat an FLP and ALT and if this is  not improved then may need to consider PCSK 9 inhibitor.  Medication Adjustments/Labs and Tests Ordered: Current medicines are reviewed at length with the patient today.  Concerns regarding medicines are outlined above.  No orders of the defined types were placed in this encounter.  No orders of the defined types were placed in this encounter.   Signed, Fransico Him, MD  04/24/2018 1:34 PM    [facility]

## 2018-04-24 NOTE — Patient Instructions (Signed)
Medication Instructions:  Increase Entresto 49/51 mg, twice a day, by mouth  Labwork: Your physician recommends that you return for lab work in: 1 week after starting Delene Loll  Follow-Up: 3 month appointment with Dr. Radford Pax    If you need a refill on your cardiac medications before your next appointment, please call your pharmacy.

## 2018-04-26 ENCOUNTER — Telehealth: Payer: Self-pay

## 2018-04-26 NOTE — Telephone Encounter (Signed)
No answer and no way to leave a message. Will continue to call. 

## 2018-04-26 NOTE — Telephone Encounter (Signed)
Kordsmeier, Mary Sella, RN  Via, Deliah Boston, LPN        Hello,   A Dr. Radford Pax patient is starting the 49/51 dose of Entresto. He is currently in the donut hole and was asking if he could have any assistance.

## 2018-04-26 NOTE — Telephone Encounter (Signed)
**Note De-identified [name] Obfuscation** No answer and no way to leave a message. Will continue to call. 

## 2018-04-27 NOTE — Telephone Encounter (Signed)
I left patient a message that we have patient assistance applications for ENTRESTO, please call us if he would like for [place] to mail him one.

## 2018-05-01 ENCOUNTER — Other Ambulatory Visit: Payer: Self-pay | Admitting: Cardiology

## 2018-05-01 DIAGNOSIS — I493 Ventricular premature depolarization: Secondary | ICD-10-CM | POA: Diagnosis not present

## 2018-05-01 DIAGNOSIS — R9431 Abnormal electrocardiogram [ECG] [EKG]: Secondary | ICD-10-CM | POA: Diagnosis not present

## 2018-05-01 DIAGNOSIS — I10 Essential (primary) hypertension: Secondary | ICD-10-CM | POA: Diagnosis not present

## 2018-05-01 DIAGNOSIS — Z955 Presence of coronary angioplasty implant and graft: Secondary | ICD-10-CM | POA: Diagnosis not present

## 2018-05-01 DIAGNOSIS — Z87891 Personal history of nicotine dependence: Secondary | ICD-10-CM | POA: Diagnosis not present

## 2018-05-01 DIAGNOSIS — I4891 Unspecified atrial fibrillation: Secondary | ICD-10-CM | POA: Diagnosis not present

## 2018-05-01 DIAGNOSIS — Z8679 Personal history of other diseases of the circulatory system: Secondary | ICD-10-CM | POA: Diagnosis not present

## 2018-05-01 DIAGNOSIS — Z01812 Encounter for preprocedural laboratory examination: Secondary | ICD-10-CM | POA: Diagnosis not present

## 2018-05-03 ENCOUNTER — Other Ambulatory Visit: Payer: PPO

## 2018-05-04 ENCOUNTER — Other Ambulatory Visit: Payer: PPO | Admitting: *Deleted

## 2018-05-04 DIAGNOSIS — I5042 Chronic combined systolic (congestive) and diastolic (congestive) heart failure: Secondary | ICD-10-CM | POA: Diagnosis not present

## 2018-05-04 DIAGNOSIS — M25561 Pain in right knee: Secondary | ICD-10-CM | POA: Diagnosis not present

## 2018-05-04 DIAGNOSIS — M25562 Pain in left knee: Secondary | ICD-10-CM | POA: Diagnosis not present

## 2018-05-04 DIAGNOSIS — Z79899 Other long term (current) drug therapy: Secondary | ICD-10-CM

## 2018-05-04 LAB — BASIC METABOLIC PANEL
BUN/Creatinine Ratio: 21 (ref 10–24)
BUN: 25 mg/dL (ref 8–27)
CO2: 21 mmol/L (ref 20–29)
Calcium: 9.6 mg/dL (ref 8.6–10.2)
Chloride: 102 mmol/L (ref 96–106)
Creatinine, Ser: 1.2 mg/dL (ref 0.76–1.27)
GFR calc Af Amer: 66 mL/min/{1.73_m2} (ref >59)
GFR calc non Af Amer: 57 mL/min/{1.73_m2} — ABNORMAL LOW (ref >59)
GLUCOSE: 82 mg/dL (ref 65–99)
POTASSIUM: 5.2 mmol/L (ref 3.5–5.2)
SODIUM: 139 mmol/L (ref 134–144)

## 2018-05-11 DIAGNOSIS — I2510 Atherosclerotic heart disease of native coronary artery without angina pectoris: Secondary | ICD-10-CM | POA: Diagnosis not present

## 2018-05-11 DIAGNOSIS — G4733 Obstructive sleep apnea (adult) (pediatric): Secondary | ICD-10-CM | POA: Diagnosis not present

## 2018-05-11 DIAGNOSIS — I110 Hypertensive heart disease with heart failure: Secondary | ICD-10-CM | POA: Diagnosis not present

## 2018-05-11 DIAGNOSIS — E785 Hyperlipidemia, unspecified: Secondary | ICD-10-CM | POA: Diagnosis not present

## 2018-05-11 DIAGNOSIS — I4891 Unspecified atrial fibrillation: Secondary | ICD-10-CM | POA: Diagnosis not present

## 2018-05-11 DIAGNOSIS — Z7902 Long term (current) use of antithrombotics/antiplatelets: Secondary | ICD-10-CM | POA: Diagnosis not present

## 2018-05-11 DIAGNOSIS — L4050 Arthropathic psoriasis, unspecified: Secondary | ICD-10-CM | POA: Diagnosis not present

## 2018-05-11 DIAGNOSIS — Z9989 Dependence on other enabling machines and devices: Secondary | ICD-10-CM | POA: Diagnosis not present

## 2018-05-11 DIAGNOSIS — I509 Heart failure, unspecified: Secondary | ICD-10-CM | POA: Diagnosis not present

## 2018-05-11 DIAGNOSIS — H1851 Endothelial corneal dystrophy: Secondary | ICD-10-CM | POA: Diagnosis not present

## 2018-05-11 DIAGNOSIS — K219 Gastro-esophageal reflux disease without esophagitis: Secondary | ICD-10-CM | POA: Diagnosis not present

## 2018-05-11 DIAGNOSIS — Z7901 Long term (current) use of anticoagulants: Secondary | ICD-10-CM | POA: Diagnosis not present

## 2018-05-11 DIAGNOSIS — Z955 Presence of coronary angioplasty implant and graft: Secondary | ICD-10-CM | POA: Diagnosis not present

## 2018-05-15 DIAGNOSIS — H40003 Preglaucoma, unspecified, bilateral: Secondary | ICD-10-CM | POA: Diagnosis not present

## 2018-05-15 DIAGNOSIS — Z961 Presence of intraocular lens: Secondary | ICD-10-CM | POA: Diagnosis not present

## 2018-05-15 DIAGNOSIS — Z947 Corneal transplant status: Secondary | ICD-10-CM | POA: Diagnosis not present

## 2018-05-15 DIAGNOSIS — H1851 Endothelial corneal dystrophy: Secondary | ICD-10-CM | POA: Diagnosis not present

## 2018-05-30 DIAGNOSIS — I480 Paroxysmal atrial fibrillation: Secondary | ICD-10-CM | POA: Diagnosis not present

## 2018-05-30 DIAGNOSIS — I509 Heart failure, unspecified: Secondary | ICD-10-CM | POA: Diagnosis not present

## 2018-05-30 DIAGNOSIS — I10 Essential (primary) hypertension: Secondary | ICD-10-CM | POA: Diagnosis not present

## 2018-06-15 DIAGNOSIS — M25561 Pain in right knee: Secondary | ICD-10-CM | POA: Diagnosis not present

## 2018-06-15 DIAGNOSIS — M25511 Pain in right shoulder: Secondary | ICD-10-CM | POA: Diagnosis not present

## 2018-06-15 DIAGNOSIS — M25562 Pain in left knee: Secondary | ICD-10-CM | POA: Diagnosis not present

## 2018-07-21 ENCOUNTER — Encounter: Payer: PPO | Admitting: Cardiology

## 2018-07-21 ENCOUNTER — Ambulatory Visit: Payer: PPO | Admitting: Cardiology

## 2018-07-21 NOTE — Progress Notes (Signed)
This encounter was created in error - please disregard.

## 2018-07-21 NOTE — Progress Notes (Deleted)
Cardiology Office Note:    Date:  07/21/2018   ID:  [name], DOB 06/10/1935, MRN [id number]  PCP:  Lajean Manes, MD  Cardiologist:  Fransico Him, MD    Referring MD: Lajean Manes, MD   No chief complaint on file.   History of Present Illness:    [name] is a 83 y.o. male with a hx of PAF s/p DCCV 10/2014, dilated aortic root at (4.6 cm) and ascending aorta (4.0 cm). He has h/o systolic HF with echo showing EF 35-40%. He also hasASCAD by cath with30% LM at the ostium, 70-80% ostial D2, 30-40% prox LCX, 30-40% prox and 40% mid RCA and is on medical management. He is on chronic anticoagulation therapy with Eliquis. He was previously on amiodarone for his afib, however this was discontinued 10/2016 due to intolerances (visual disturbances and bilateral hand numbness).Repeat cath 10/2017 for deterioration of LVF showed  an occluded RPDA which filled via left to right collaterals, 85% chronic stenosis of oD2, 99% mid RCA, 30% proximal to mid LAD, 30% proximal to distal left circumflex and moderate LV dysfunction with EF 25-35%. He underwent PCI with drug-eluting stent to the mid RCA. He also was noted to have moderate pulmonary hypertension with an LVEDP of 29 and a pulmonary capillary wedge closure to 40. It was felt that he likely had a combination of group 2 and group 3 pulmonary hypertension secondary to obstructive sleep apnea as well as congestive heart failure with pulmonary venous hypertension.  He is here today for followup and is doing well.  He denies any chest pain or pressure, SOB, DOE, PND, orthopnea, LE edema, dizziness, palpitations or syncope. He is compliant with his meds and is tolerating meds with no SE.    Past Medical History:  Diagnosis Date  . Benign essential HTN 09/17/2014  . CAD (coronary artery disease), native coronary artery 09/20/2014   30% LM at the ostium, 70-80% ostial D2, 30-40% prox LCX, 30-40% prox and 40% mid RCA and EF 40%. 11/07/17 DES  to RCA   . Chronic lower back pain   . DCM (dilated cardiomyopathy) ([place]) 09/17/2014   a.  EF 40%; b. Echo 5/16:  EF 40-45%, diff HK, inf AK, mild AI, MAC, severe LAE  . Dilated aortic root ([place]) 09/17/2014   normal diameter on echo 2017  . DJD (degenerative joint disease)    feet, knees and back  . GERD (gastroesophageal reflux disease)    hiatal hernia  . Hepatitis 1953   "A or B; not sure which"  . High cholesterol   . History of blood transfusion 1954   "related to hip OR"  . History of hip surgery 1954   "knocked hips out of joints; had to have bone grafts; both sides"  . Hyperlipidemia LDL goal <70 10/14/2015  . Lesion of left femoral nerve    3 cm MRI 04/2010 consistent with hemangioma  . Mild aortic insufficiency   . Obesity   . OSA (obstructive sleep apnea) 10/14/2015   Mild with AHI 10.6/hr overall and 38/hr during REM sleep.  On CPAP at 9cm H2O  . OSA on CPAP   . Osteoarthritis    "all over" (11/07/2017)  . PAF (paroxysmal atrial fibrillation) ([place]) 09/17/2014  . PSA (psoriatic arthritis) ([facility])    Mild increase 3.65 on 05/2011 stable 3.68 on 09/2011  . Renal cyst, left 2010   5 cm seen on ultrasound     Past Surgical History:  Procedure Laterality  Date  . APPENDECTOMY    . BACK SURGERY    . CARDIOVERSION N/A 11/04/2014   Procedure: CARDIOVERSION;  Surgeon: Sueanne Margarita, MD;  Location: [facility] ENDOSCOPY;  Service: Cardiovascular;  Laterality: N/A;  . CARDIOVERSION N/A 01/09/2015   Procedure: CARDIOVERSION;  Surgeon: Pixie Casino, MD;  Location: [facility] ENDOSCOPY;  Service: Cardiovascular;  Laterality: N/A;  . CATARACT EXTRACTION W/ INTRAOCULAR LENS  IMPLANT, BILATERAL Bilateral   . CORONARY STENT INTERVENTION N/A 11/07/2017   Procedure: CORONARY STENT INTERVENTION;  Surgeon: Leonie Man, MD;  Location: [place] CV LAB;  Service: Cardiovascular;  Laterality: N/A;  . HERNIA REPAIR    . JOINT REPLACEMENT    . LAPAROSCOPIC CHOLECYSTECTOMY    . LEFT HEART CATHETERIZATION  WITH CORONARY ANGIOGRAM N/A 10/01/2014   Procedure: LEFT HEART CATHETERIZATION WITH CORONARY ANGIOGRAM;  Surgeon: Troy Sine, MD;  Location: [facility] CATH LAB;  Service: Cardiovascular;  Laterality: N/A;  . LUMBAR LAMINECTOMY    . REVISION TOTAL HIP ARTHROPLASTY Right   . RIGHT/LEFT HEART CATH AND CORONARY ANGIOGRAPHY N/A 11/07/2017   Procedure: RIGHT/LEFT HEART CATH AND CORONARY ANGIOGRAPHY;  Surgeon: Leonie Man, MD;  Location: [place] CV LAB;  Service: Cardiovascular;  Laterality: N/A;  . SHOULDER OPEN ROTATOR CUFF REPAIR Bilateral   . TONSILLECTOMY    . TOTAL HIP ARTHROPLASTY Bilateral   . TOTAL KNEE ARTHROPLASTY Bilateral   . UMBILICAL HERNIA REPAIR      Current Medications: No outpatient medications have been marked as taking for the 07/21/18 encounter (Appointment) with Sueanne Margarita, MD.     Allergies:   Simvastatin; Codeine phosphate; and Penicillins   Social History   Socioeconomic History  . Marital status: Married    Spouse name: Not on file  . Number of children: 4  . Years of education: Not on file  . Highest education level: Not on file  Occupational History  . Occupation: Naval architect  Social Needs  . Financial resource strain: Not on file  . Food insecurity:    Worry: Not on file    Inability: Not on file  . Transportation needs:    Medical: Not on file    Non-medical: Not on file  Tobacco Use  . Smoking status: Former Smoker    Packs/day: 1.00    Years: 30.00    Pack years: 30.00    Types: Cigarettes  . Smokeless tobacco: Never Used  . Tobacco comment: "quit smoking in the 1980s"  Substance and Sexual Activity  . Alcohol use: No  . Drug use: No  . Sexual activity: Yes  Lifestyle  . Physical activity:    Days per week: Not on file    Minutes per session: Not on file  . Stress: Not on file  Relationships  . Social connections:    Talks on phone: Not on file    Gets together: Not on file    Attends religious service: Not on file     Active member of club or organization: Not on file    Attends meetings of clubs or organizations: Not on file    Relationship status: Not on file  Other Topics Concern  . Not on file  Social History Narrative  . Not on file     Family History: The patient's family history includes Atrial fibrillation in his sister; Cancer in his mother; Diabetes in his father; Heart attack in his maternal grandmother, paternal grandfather, and paternal grandmother; Heart disease in his sister; Stroke in his  maternal grandfather.  ROS:   Please see the history of present illness.    ROS  All other systems reviewed and negative.   EKGs/Labs/Other Studies Reviewed:    The following studies were reviewed today: none  EKG:  EKG is not ordered today.   Recent Labs: 09/01/2017: TSH 1.810 10/20/2017: ALT 27; NT-Pro BNP 1,530 11/07/2017: Magnesium 1.9 11/08/2017: Hemoglobin 15.4; Platelets 168 05/04/2018: BUN 25; Creatinine, Ser 1.20; Potassium 5.2; Sodium 139   Recent Lipid Panel    Component Value Date/Time   CHOL 149 10/20/2017 1220   TRIG 96 10/20/2017 1220   HDL 49 10/20/2017 1220   CHOLHDL 3.0 10/20/2017 1220   CHOLHDL 2.5 05/10/2016 1145   VLDL 19 05/10/2016 1145   LDLCALC 81 10/20/2017 1220    Physical Exam:    VS:  There were no vitals taken for this visit.    Wt Readings from Last 3 Encounters:  04/24/18 253 lb 9.6 oz (115 kg)  11/23/17 242 lb 12.8 oz (110.1 kg)  11/08/17 245 lb 13 oz (111.5 kg)     GEN:  Well nourished, well developed in no acute distress HEENT: Normal NECK: No JVD; No carotid bruits LYMPHATICS: No lymphadenopathy CARDIAC: RRR, no murmurs, rubs, gallops RESPIRATORY:  Clear to auscultation without rales, wheezing or rhonchi  ABDOMEN: Soft, non-tender, non-distended MUSCULOSKELETAL:  No edema; No deformity  SKIN: Warm and dry NEUROLOGIC:  Alert and oriented x 3 PSYCHIATRIC:  Normal affect   ASSESSMENT:    No diagnosis found. PLAN:    In order of  problems listed above:  ***   Medication Adjustments/Labs and Tests Ordered: Current medicines are reviewed at length with the patient today.  Concerns regarding medicines are outlined above.  No orders of the defined types were placed in this encounter.  No orders of the defined types were placed in this encounter.   Signed, Fransico Him, MD  07/21/2018 9:39 AM    [place]

## 2018-07-25 ENCOUNTER — Encounter: Payer: Self-pay | Admitting: Cardiology

## 2018-07-28 ENCOUNTER — Telehealth: Payer: Self-pay | Admitting: Cardiology

## 2018-07-28 NOTE — Telephone Encounter (Signed)
Patient was schedule when the office was closed on 12/6. Rescheduled him to see Jory Sims on 12/17 11 am.

## 2018-07-28 NOTE — Telephone Encounter (Signed)
New Message:    Patient calling he would like to get a appt. There was a mix up with his appt that was on last Friday. Please call patient I didn't have anything available.

## 2018-07-31 NOTE — Progress Notes (Signed)
Cardiology Office Note   Date:  08/01/2018   ID:  [name], DOB 1935-03-31, MRN [id number]  PCP:  Lajean Manes, MD  Cardiologist:  Dr.[name] Chief Complaint  Patient presents with  . Congestive Heart Failure  . Leg Swelling     History of Present Illness: [name] is a 83 y.o. male who presents for ongoing assessment and management of CAD with most recent cardiac cath in 10/2017 revealing an occluded RPDA, which filled via left to right collaterals, 85% chronic stenosis of the ostial diagonal 2, 99% mid RCA, 30% proximal to mid LAD, 30% proximal to distal left Cx and moderate LV dysfunction with EF of 25%-35%. He subsequently has PCI with DES to the mid RCA.   He was also found to have moderate pulmonary hypertension with an LVEDP of 29 and wedge of 40. It was felt the he had a combination of group 3 and group 3 pulmonary hypertension secondary to OSA, and CHF with pulmonary venous hypertension.   He has other history of PAF on Eliquis (amiodarone intolerance), with hx of DCCV 10/2014, dilated aortic root,ans ascending aorta (4 cm) , systolic CHF, with normalization of LVEF.   He was last seen by Dr. Radford Pax on 04/24/2018 and was doing well, asymptomatic and medically compliant. Echo was to be repeated in 09/2018. No medications were changed.   He comes today having gained 9 lbs with abdominal distention and worsening LEE. He has continued to have chronic DOE.   He denies eating salty foods regularly, but did have foods at Thanksgiving that he usually does not eat.   Past Medical History:  Diagnosis Date  . Benign essential HTN 09/17/2014  . CAD (coronary artery disease), native coronary artery 09/20/2014   30% LM at the ostium, 70-80% ostial D2, 30-40% prox LCX, 30-40% prox and 40% mid RCA and EF 40%. 11/07/17 DES to RCA   . Chronic lower back pain   . DCM (dilated cardiomyopathy) ([place]) 09/17/2014   a.  EF 40%; b. Echo 5/16:  EF 40-45%, diff HK, inf AK, mild AI, MAC, severe LAE    . Dilated aortic root ([place]) 09/17/2014   normal diameter on echo 2017  . DJD (degenerative joint disease)    feet, knees and back  . GERD (gastroesophageal reflux disease)    hiatal hernia  . Hepatitis 1953   "A or B; not sure which"  . High cholesterol   . History of blood transfusion 1954   "related to hip OR"  . History of hip surgery 1954   "knocked hips out of joints; had to have bone grafts; both sides"  . Hyperlipidemia LDL goal <70 10/14/2015  . Lesion of left femoral nerve    3 cm MRI 04/2010 consistent with hemangioma  . Mild aortic insufficiency   . Obesity   . OSA (obstructive sleep apnea) 10/14/2015   Mild with AHI 10.6/hr overall and 38/hr during REM sleep.  On CPAP at 9cm H2O  . OSA on CPAP   . Osteoarthritis    "all over" (11/07/2017)  . PAF (paroxysmal atrial fibrillation) ([place]) 09/17/2014  . PSA (psoriatic arthritis) ([facility])    Mild increase 3.65 on 05/2011 stable 3.68 on 09/2011  . Renal cyst, left 2010   5 cm seen on ultrasound     Past Surgical History:  Procedure Laterality Date  . APPENDECTOMY    . BACK SURGERY    . CARDIOVERSION N/A 11/04/2014   Procedure: CARDIOVERSION;  Surgeon: Eber Hong  Radford Pax, MD;  Location: [facility];  Service: Cardiovascular;  Laterality: N/A;  . CARDIOVERSION N/A 01/09/2015   Procedure: CARDIOVERSION;  Surgeon: Pixie Casino, MD;  Location[facility] ENDOSCOPY;  Service: Cardiovascular;  Laterality: N/A;  . CATARACT EXTRACTION W/ INTRAOCULAR LENS  IMPLANT, BILATERAL Bilateral   . CORONARY STENT INTERVENTION N/A 11/07/2017   Procedure: CORONARY STENT INTERVENTION;  Surgeon: Leonie Man, MD;  Location: [facility] CV LAB;  Service: Cardiovascular;  Laterality: N/A;  . HERNIA REPAIR    . JOINT REPLACEMENT    . LAPAROSCOPIC CHOLECYSTECTOMY    . LEFT HEART CATHETERIZATION WITH CORONARY ANGIOGRAM N/A 10/01/2014   Procedure: LEFT HEART CATHETERIZATION WITH CORONARY ANGIOGRAM;  Surgeon: Troy Sine, MD;  Location[facility] CATH LAB;  Service:  Cardiovascular;  Laterality: N/A;  . LUMBAR LAMINECTOMY    . REVISION TOTAL HIP ARTHROPLASTY Right   . RIGHT/LEFT HEART CATH AND CORONARY ANGIOGRAPHY N/A 11/07/2017   Procedure: RIGHT/LEFT HEART CATH AND CORONARY ANGIOGRAPHY;  Surgeon: Leonie Man, MD;  Location: [place] CV LAB;  Service: Cardiovascular;  Laterality: N/A;  . SHOULDER OPEN ROTATOR CUFF REPAIR Bilateral   . TONSILLECTOMY    . TOTAL HIP ARTHROPLASTY Bilateral   . TOTAL KNEE ARTHROPLASTY Bilateral   . UMBILICAL HERNIA REPAIR       Current Outpatient Medications  Medication Sig Dispense Refill  . acetaminophen (TYLENOL) 650 MG CR tablet Take 650 mg by mouth every 8 (eight) hours as needed for pain.    Marland Kitchen apixaban (ELIQUIS) 5 MG TABS tablet Take 1 tablet (5 mg total) by mouth 2 (two) times daily. 180 tablet 3  . clopidogrel (PLAVIX) 75 MG tablet Take 1 tablet (75 mg total) by mouth daily with breakfast. 90 tablet 2  . ezetimibe (ZETIA) 10 MG tablet Take 1 tablet (10 mg total) by mouth daily. 90 tablet 3  . loratadine (CLARITIN) 10 MG tablet Take 10 mg by mouth daily as needed for allergies.    . metoprolol succinate (TOPROL-XL) 50 MG 24 hr tablet Take 50 mg by mouth daily. Take with or immediately following a meal.    . MULTIPLE VITAMINS PO Take 1 tablet by mouth daily.    . pantoprazole (PROTONIX) 40 MG tablet Take 1 tablet (40 mg total) by mouth daily. 30 tablet 1  . polyethylene glycol (MIRALAX / GLYCOLAX) packet Take 17 g by mouth daily as needed (constipation).     . rosuvastatin (CRESTOR) 40 MG tablet TAKE ONE TABLET BY MOUTH DAILY 90 tablet 3  . sacubitril-valsartan (ENTRESTO) 49-51 MG Take 1 tablet by mouth 2 (two) times daily. 180 tablet 3  . spironolactone (ALDACTONE) 25 MG tablet Take 0.5 tablets (12.5 mg total) by mouth daily. 45 tablet 3  . traMADol (ULTRAM) 50 MG tablet Take 50 mg by mouth every 6 (six) hours as needed for moderate pain.    Marland Kitchen torsemide (DEMADEX) 20 MG tablet Take 1 tablet (20 mg total) by  mouth daily. Today 16m, 12-18 40-am then 225mQD 65 tablet 12   No current facility-administered medications for this visit.     Allergies:   Simvastatin; Codeine phosphate; and Penicillins    Social History:  The patient  reports that he has quit smoking. His smoking use included cigarettes. He has a 30.00 pack-year smoking history. He has never used smokeless tobacco. He reports that he does not drink alcohol or use drugs.   Family History:  The patient's family history includes Atrial fibrillation in his sister; Cancer in  his mother; Diabetes in his father; Heart attack in his maternal grandmother, paternal grandfather, and paternal grandmother; Heart disease in his sister; Stroke in his maternal grandfather.    ROS: All other systems are reviewed and negative. Unless otherwise mentioned in H&P    PHYSICAL EXAM: VS:  BP 126/64   Pulse 73   Ht 6' (1.829 m)   Wt 261 lb 3.2 oz (118.5 kg)   SpO2 99%   BMI 35.43 kg/m  , BMI Body mass index is 35.43 kg/m. GEN: Well nourished, well developed, in no acute distress HEENT: normal Neck: no JVD, carotid bruits, or masses Cardiac: RRR; soft S3, no murmurs, rubs, or gallops,no edema  Respiratory:  Mild crackles in the bases. No active wheezing.  GI: soft, nontender, nondistended, + BS MS: no deformity or atrophy Skin: warm and dry, no rash Neuro:  Strength and sensation are intact Psych: euthymic mood, full affect   EKG:  Not completed this office visit.   Recent Labs: 09/01/2017: TSH 1.810 10/20/2017: ALT 27; NT-Pro BNP 1,530 11/07/2017: Magnesium 1.9 11/08/2017: Hemoglobin 15.4; Platelets 168 05/04/2018: BUN 25; Creatinine, Ser 1.20; Potassium 5.2; Sodium 139    Lipid Panel    Component Value Date/Time   CHOL 149 10/20/2017 1220   TRIG 96 10/20/2017 1220   HDL 49 10/20/2017 1220   CHOLHDL 3.0 10/20/2017 1220   CHOLHDL 2.5 05/10/2016 1145   VLDL 19 05/10/2016 1145   LDLCALC 81 10/20/2017 1220      Wt Readings from Last 3  Encounters:  08/01/18 261 lb 3.2 oz (118.5 kg)  04/24/18 253 lb 9.6 oz (115 kg)  11/23/17 242 lb 12.8 oz (110.1 kg)      Other studies Reviewed: Left ventricle: The cavity size was moderately dilated. There was   mild concentric hypertrophy. Systolic function was moderately   reduced. The estimated ejection fraction was in the range of 35%   to 40%. Diffuse hypokinesis. The study was not technically   sufficient to allow evaluation of LV diastolic dysfunction due to   atrial fibrillation. - Aortic valve: Trileaflet; mildly thickened, mildly calcified   leaflets. There was mild regurgitation. - Aortic root: The aortic root was dilated measuring 46 mm. - Ascending aorta: The ascending aorta was normal size. - Mitral valve: Calcified annulus. Mildly thickened leaflets .   There was mild regurgitation. - Left atrium: The atrium was severely dilated. - Right ventricle: Systolic function was normal. - Right atrium: The atrium was moderately dilated. - Tricuspid valve: There was mild regurgitation. - Pulmonary arteries: Systolic pressure was within the normal  Cardiac Cath 11/07/2017  RPDA lesion is 100% stenosed. -Fills via left to right collaterals as well as RPDA distal artery marginal  Ost 2nd Diag lesion is 85% stenosed. chronic  CULPRIT LESION: Mid RCA lesion is 99% stenosed.  A drug-eluting stent was successfully placed using a STENT SYNERGY DES 4X20. --> Postdilated to 4.7 mm  Post intervention, there is a 0% residual stenosis. Mid LAD lesion is 30% stenosed.  Prox LAD to Mid LAD lesion is 30% stenosed.  Prox Cx to Dist Cx lesion is 30% stenosed with 30% stenosed side branch in Ost 1st Mrg.  There is moderate to severe left ventricular systolic dysfunction. The left ventricular ejection fraction is 25-35% by visual estimate.  LV end diastolic pressure is severely elevated.  Hemodynamic findings consistent with moderate pulmonary hypertension. --> With EDP of 29, and PCWP  of closer to 40, there is likely a combined component  Severe 99% mid RCA stenosis -treated successfully with a single DES stent Otherwise stable mild disease elsewhere.  There does appear to be chronically occluded PDA that fills from collaterals in the distal RV marginal branch.  Severe cardiomyopathy with reduced ejection fraction and elevated LVEDP.  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  ASSESSMENT AND PLAN:  1. Acute on chronic mixed CHF: He has gained 9 lbs with evidence of volume overload on examination. I will stop lasix 20 mg as he was taking extra doses with no improvement in symptoms. I will change to torsemide 20 mg daily, He will take a dose this afternoon, 40 mg in the am and 20 mg daily thereafter. He is to weigh himself daily and monitor for worsening edema. He will have follow up labs in 3 days for evaluation of his kidney function. Continue spironolactone and Entresto.  2. PAF: Heart rate is regular and well controlled today. He will continue his current regimen with Eliquis for anticoagulation.    3. CAD: Cath completed in 10/2017 demonstrated multivessel disease, and required a DES to the mid RCA. Continue Plavix and Eliquis    Current medicines are reviewed at length with the patient today.    Labs/ tests ordered today include: BMET  Phill Myron. West Pugh, ANP, AACC   08/01/2018 12:38 PM    [address] Office [phone] Fax [phone]

## 2018-08-01 ENCOUNTER — Ambulatory Visit: Payer: PPO | Admitting: Adult Health

## 2018-08-01 ENCOUNTER — Encounter (INDEPENDENT_AMBULATORY_CARE_PROVIDER_SITE_OTHER): Payer: Self-pay

## 2018-08-01 ENCOUNTER — Encounter: Payer: Self-pay | Admitting: Adult Health

## 2018-08-01 VITALS — BP 126/64 | HR 73 | Ht 72.0 in | Wt 261.2 lb

## 2018-08-01 DIAGNOSIS — I480 Paroxysmal atrial fibrillation: Secondary | ICD-10-CM

## 2018-08-01 DIAGNOSIS — I5042 Chronic combined systolic (congestive) and diastolic (congestive) heart failure: Secondary | ICD-10-CM | POA: Diagnosis not present

## 2018-08-01 DIAGNOSIS — I2510 Atherosclerotic heart disease of native coronary artery without angina pectoris: Secondary | ICD-10-CM | POA: Diagnosis not present

## 2018-08-01 DIAGNOSIS — Z79899 Other long term (current) drug therapy: Secondary | ICD-10-CM

## 2018-08-01 MED ORDER — TORSEMIDE 20 MG PO TABS: 20.0000 mg | ORAL_TABLET | Freq: Every day | ORAL | 12 refills | Status: DC

## 2018-08-01 NOTE — Patient Instructions (Signed)
Medication Instructions:  STOP LASIX  START TORSEMIDE 20MG : TAKE 20MG  TODAY, TOMORROW 40MG  (2TABS) THEN 20MG  (1TAB) DAILY THEREAFTER  If you need a refill on your cardiac medications before your next appointment, please call your pharmacy.  Labwork: BMET Friday (12-20) HERE IN OUR OFFICE AT [facility]  You will NOT need to fast .  Take the provided lab slips with you to the lab for your blood draw.    When you have your labs (blood work) drawn today and your tests are completely normal, you will receive your results only by MyChart Message (if you have MyChart) -OR-  A paper copy in the mail.  If you have any lab test that is abnormal or we need to change your treatment, we will call you to review these results.  Follow-Up: You will need a follow up appointment in 2 weeks.  Please call our office 2 months in advance to schedule this appointment.  You may see Fransico Him, MD, Jory Sims, DNP, AACC  or one of the following Advanced Practice Providers on your designated Care Team: Lyda Jester, PA-C   Dayna Dunn, PA-C   Ermalinda Barrios, PA-C    At [facility], you and your health needs are our priority.  As part of our continuing mission to provide you with exceptional heart care, we have created designated Provider Care Teams.  These Care Teams include your primary Cardiologist (physician) and Advanced Practice Providers (APPs -  Physician Assistants and Nurse Practitioners) who all work together to provide you with the care you need, when you need it.  Thank you for choosing [facility] at [facility]!!

## 2018-08-04 ENCOUNTER — Other Ambulatory Visit: Payer: PPO

## 2018-08-04 DIAGNOSIS — Z79899 Other long term (current) drug therapy: Secondary | ICD-10-CM | POA: Diagnosis not present

## 2018-08-04 DIAGNOSIS — I5042 Chronic combined systolic (congestive) and diastolic (congestive) heart failure: Secondary | ICD-10-CM | POA: Diagnosis not present

## 2018-08-04 LAB — BASIC METABOLIC PANEL
BUN/Creatinine Ratio: 25 — ABNORMAL HIGH (ref 10–24)
BUN: 39 mg/dL — ABNORMAL HIGH (ref 8–27)
CALCIUM: 9.3 mg/dL (ref 8.6–10.2)
CO2: 22 mmol/L (ref 20–29)
CREATININE: 1.55 mg/dL — AB (ref 0.76–1.27)
Chloride: 99 mmol/L (ref 96–106)
GFR calc Af Amer: 48 mL/min/{1.73_m2} — ABNORMAL LOW (ref >59)
GFR calc non Af Amer: 41 mL/min/{1.73_m2} — ABNORMAL LOW (ref >59)
GLUCOSE: 104 mg/dL — AB (ref 65–99)
POTASSIUM: 4.9 mmol/L (ref 3.5–5.2)
SODIUM: 136 mmol/L (ref 134–144)

## 2018-08-07 ENCOUNTER — Other Ambulatory Visit: Payer: Self-pay

## 2018-08-07 DIAGNOSIS — I5042 Chronic combined systolic (congestive) and diastolic (congestive) heart failure: Secondary | ICD-10-CM

## 2018-08-07 DIAGNOSIS — Z79899 Other long term (current) drug therapy: Secondary | ICD-10-CM

## 2018-08-07 NOTE — Progress Notes (Signed)
BMET D/W KL SHE STATES TO HAVE PT GO TO THE ER IF STILL SWOLLEN AND DOE.

## 2018-08-12 NOTE — Progress Notes (Signed)
Cardiology Office Note   Date:  08/14/2018   ID:  MORLEY GAUMER, DOB June 10, 1935, MRN [id number]  PCP:  Lajean Manes, MD  Cardiologist: Dr. Radford Pax No chief complaint on file.    History of Present Illness: [name] is a 83 y.o. male who presents for ongoing assessment and management of coronary artery disease, most recent cardiac catheterization March 2019 which demonstrated an occluded RPDA which filled via left to right collaterals, 85% chronic stenosis of the ostial diagonal 2, 99% mid RCA stenosis, 30% proximal to mid LAD stenosis, 30% proximal to distal left circumflex stenosis, moderate LV dysfunction with an EF of 25% to 35%, with ultimate PCI with drug-eluting stent to the mid RCA 11/07/2017.  Other history includes moderate pulmonary hypertension with LVEDP of 29 and wedge of 40, OSA, systolic CHF, PAF on Eliquis (amiodarone intolerance) with DCCV on 10/2014, and dilated aortic root (4 cm).  He was last seen in the office by me on 08/01/2018 with complaints of lower extremity edema, abdominal distention, and chronic dyspnea on exertion.  On that visit he had gained 9 pounds and did have evidence of fluid overload.  He was changed to torsemide 20 mg daily from Lasix 20 mg daily as he was taking extra doses with no improvement in his symptoms.  He initially was given 40 mg in the a.m. and 20 mg in the p.m. for couple of days and then to continue 20 mg daily thereafter.  He was continued on spironolactone and Entresto with follow-up labs.  The patient called our office on 08/07/2018 with continued lower extremity edema and was advised to go to the emergency room as he may need IV diuresis.  The patient refused ER evaluation as he states he is chronically dyspneic, and has ongoing edema despite his medications. Review of labs revealed a creatinine of 1.55, potassium of 4.9, BUN of 39.  He is here today without further complaints. He has lost 6 lbs and has no further complaints of LEE. He  continues to be active and has been riding his bike more. Today he rode 8 miles. He tries to ride everyday at least 5 miles a day but further if he feels well.   Past Medical History:  Diagnosis Date  . Benign essential HTN 09/17/2014  . CAD (coronary artery disease), native coronary artery 09/20/2014   30% LM at the ostium, 70-80% ostial D2, 30-40% prox LCX, 30-40% prox and 40% mid RCA and EF 40%. 11/07/17 DES to RCA   . Chronic lower back pain   . DCM (dilated cardiomyopathy) ([place]) 09/17/2014   a.  EF 40%; b. Echo 5/16:  EF 40-45%, diff HK, inf AK, mild AI, MAC, severe LAE  . Dilated aortic root ([place]) 09/17/2014   normal diameter on echo 2017  . DJD (degenerative joint disease)    feet, knees and back  . GERD (gastroesophageal reflux disease)    hiatal hernia  . Hepatitis 1953   "A or B; not sure which"  . High cholesterol   . History of blood transfusion 1954   "related to hip OR"  . History of hip surgery 1954   "knocked hips out of joints; had to have bone grafts; both sides"  . Hyperlipidemia LDL goal <70 10/14/2015  . Lesion of left femoral nerve    3 cm MRI 04/2010 consistent with hemangioma  . Mild aortic insufficiency   . Obesity   . OSA (obstructive sleep apnea) 10/14/2015   Mild with  AHI 10.6/hr overall and 38/hr during REM sleep.  On CPAP at 9cm H2O  . OSA on CPAP   . Osteoarthritis    "all over" (11/07/2017)  . PAF (paroxysmal atrial fibrillation) ([place]) 09/17/2014  . PSA (psoriatic arthritis) ([facility])    Mild increase 3.65 on 05/2011 stable 3.68 on 09/2011  . Renal cyst, left 2010   5 cm seen on ultrasound     Past Surgical History:  Procedure Laterality Date  . APPENDECTOMY    . BACK SURGERY    . CARDIOVERSION N/A 11/04/2014   Procedure: CARDIOVERSION;  Surgeon: Sueanne Margarita, MD;  Location: [facility] ENDOSCOPY;  Service: Cardiovascular;  Laterality: N/A;  . CARDIOVERSION N/A 01/09/2015   Procedure: CARDIOVERSION;  Surgeon: Pixie Casino, MD;  Location: [facility] ENDOSCOPY;   Service: Cardiovascular;  Laterality: N/A;  . CATARACT EXTRACTION W/ INTRAOCULAR LENS  IMPLANT, BILATERAL Bilateral   . CORONARY STENT INTERVENTION N/A 11/07/2017   Procedure: CORONARY STENT INTERVENTION;  Surgeon: Leonie Man, MD;  Location: [place] CV LAB;  Service: Cardiovascular;  Laterality: N/A;  . HERNIA REPAIR    . JOINT REPLACEMENT    . LAPAROSCOPIC CHOLECYSTECTOMY    . LEFT HEART CATHETERIZATION WITH CORONARY ANGIOGRAM N/A 10/01/2014   Procedure: LEFT HEART CATHETERIZATION WITH CORONARY ANGIOGRAM;  Surgeon: Troy Sine, MD;  Location: [facility] CATH LAB;  Service: Cardiovascular;  Laterality: N/A;  . LUMBAR LAMINECTOMY    . REVISION TOTAL HIP ARTHROPLASTY Right   . RIGHT/LEFT HEART CATH AND CORONARY ANGIOGRAPHY N/A 11/07/2017   Procedure: RIGHT/LEFT HEART CATH AND CORONARY ANGIOGRAPHY;  Surgeon: Leonie Man, MD;  Location: [place] CV LAB;  Service: Cardiovascular;  Laterality: N/A;  . SHOULDER OPEN ROTATOR CUFF REPAIR Bilateral   . TONSILLECTOMY    . TOTAL HIP ARTHROPLASTY Bilateral   . TOTAL KNEE ARTHROPLASTY Bilateral   . UMBILICAL HERNIA REPAIR       Current Outpatient Medications  Medication Sig Dispense Refill  . acetaminophen (TYLENOL) 650 MG CR tablet Take 650 mg by mouth every 8 (eight) hours as needed for pain.    Marland Kitchen apixaban (ELIQUIS) 5 MG TABS tablet Take 1 tablet (5 mg total) by mouth 2 (two) times daily. 180 tablet 3  . clopidogrel (PLAVIX) 75 MG tablet Take 1 tablet (75 mg total) by mouth daily with breakfast. 90 tablet 2  . ezetimibe (ZETIA) 10 MG tablet Take 1 tablet (10 mg total) by mouth daily. 90 tablet 3  . metoprolol succinate (TOPROL-XL) 50 MG 24 hr tablet Take 50 mg by mouth daily. Take with or immediately following a meal.    . MULTIPLE VITAMINS PO Take 1 tablet by mouth daily.    . pantoprazole (PROTONIX) 40 MG tablet Take 1 tablet (40 mg total) by mouth daily. 30 tablet 1  . rosuvastatin (CRESTOR) 40 MG tablet TAKE ONE TABLET BY MOUTH DAILY  90 tablet 3  . sacubitril-valsartan (ENTRESTO) 49-51 MG Take 1 tablet by mouth 2 (two) times daily. 180 tablet 3  . spironolactone (ALDACTONE) 25 MG tablet Take 0.5 tablets (12.5 mg total) by mouth daily. 45 tablet 3  . torsemide (DEMADEX) 20 MG tablet Take 1 tablet (20 mg total) by mouth daily. Today 77m, 12-18 40-am then 255mQD 65 tablet 12  . traMADol (ULTRAM) 50 MG tablet Take 50 mg by mouth every 6 (six) hours as needed for moderate pain.     No current facility-administered medications for this visit.     Allergies:   Simvastatin; Codeine  phosphate; and Penicillins    Social History:  The patient  reports that he has quit smoking. His smoking use included cigarettes. He has a 30.00 pack-year smoking history. He has never used smokeless tobacco. He reports that he does not drink alcohol or use drugs.   Family History:  The patient's family history includes Atrial fibrillation in his sister; Cancer in his mother; Diabetes in his father; Heart attack in his maternal grandmother, paternal grandfather, and paternal grandmother; Heart disease in his sister; Stroke in his maternal grandfather.    ROS: All other systems are reviewed and negative. Unless otherwise mentioned in H&P    PHYSICAL EXAM: VS:  BP 100/67   Pulse (!) 48   Ht 6' (1.829 m)   Wt 255 lb 4.8 oz (115.8 kg)   BMI 34.62 kg/m  , BMI Body mass index is 34.62 kg/m. GEN: Well nourished, well developed, in no acute distress HEENT: normal Neck: no JVD, carotid bruits, or masses Cardiac: RRR; no murmurs, rubs, or gallops,no edema  Respiratory:  Clear to auscultation bilaterally, normal work of breathing GI: soft, nontender, nondistended, + BS. Abdominal obesity  MS: no deformity or atrophy Skin: warm and dry, no rash Neuro:  Strength and sensation are intact Psych: euthymic mood, full affect   EKG: Recent Labs: 09/01/2017: TSH 1.810 10/20/2017: ALT 27; NT-Pro BNP 1,530 11/07/2017: Magnesium 1.9 11/08/2017: Hemoglobin  15.4; Platelets 168 08/04/2018: BUN 39; Creatinine, Ser 1.55; Potassium 4.9; Sodium 136    Lipid Panel    Component Value Date/Time   CHOL 149 10/20/2017 1220   TRIG 96 10/20/2017 1220   HDL 49 10/20/2017 1220   CHOLHDL 3.0 10/20/2017 1220   CHOLHDL 2.5 05/10/2016 1145   VLDL 19 05/10/2016 1145   LDLCALC 81 10/20/2017 1220      Wt Readings from Last 3 Encounters:  08/14/18 255 lb 4.8 oz (115.8 kg)  08/01/18 261 lb 3.2 oz (118.5 kg)  04/24/18 253 lb 9.6 oz (115 kg)      Other studies Reviewed: Left ventricle: The cavity size was moderately dilated. There was mild concentric hypertrophy. Systolic function was moderately reduced. The estimated ejection fraction was in the range of 35% to 40%. Diffuse hypokinesis. The study was not technically sufficient to allow evaluation of LV diastolic dysfunction due to atrial fibrillation. - Aortic valve: Trileaflet; mildly thickened, mildly calcified leaflets. There was mild regurgitation. - Aortic root: The aortic root was dilated measuring 46 mm. - Ascending aorta: The ascending aorta was normal size. - Mitral valve: Calcified annulus. Mildly thickened leaflets . There was mild regurgitation. - Left atrium: The atrium was severely dilated. - Right ventricle: Systolic function was normal. - Right atrium: The atrium was moderately dilated. - Tricuspid valve: There was mild regurgitation. - Pulmonary arteries: Systolic pressure was within the normal  Cardiac Cath 11/07/2017  RPDA lesion is 100% stenosed. -Fills via left to right collaterals as well as RPDA distal artery marginal  Ost 2nd Diag lesion is 85% stenosed. chronic  CULPRIT LESION: Mid RCA lesion is 99% stenosed.  A drug-eluting stent was successfully placed using a STENT SYNERGY DES 4X20. --> Postdilated to 4.7 mm  Post intervention, there is a 0% residual stenosis. Mid LAD lesion is 30% stenosed.  Prox LAD to Mid LAD lesion is 30% stenosed.  Prox Cx  to Dist Cx lesion is 30% stenosed with 30% stenosed side branch in Ost 1st Mrg.  There is moderate to severe left ventricular systolic dysfunction. The left ventricular ejection  fraction is 25-35% by visual estimate.  LV end diastolic pressure is severely elevated.  Hemodynamic findings consistent with moderate pulmonary hypertension. --> With EDP of 29, and PCWP of closer to 40, there is likely a combined component  Severe 99% mid RCA stenosis -treated successfully with a single DES stent Otherwise stable mild disease elsewhere. There does appear to be chronically occluded PDA that fills from collaterals in the distal RV marginal branch.  Severe cardiomyopathy with reduced ejection fraction and elevated LVEDP. 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  ASSESSMENT AND PLAN:  1.  Chronic ICM: EF of 35%-40%: He remains on Entresto, spironolactone, and torsemide. The torsemide appears to be working much better for him, having allowed for better diureses. He has lost 6 lbs since being seen last. He will continue on current regimen. Follow up labs have been checked as above. He is to call for weight gain or fluid excess. He is to avoid salt.   2. CAD: S/P DES to RCA 10/2017. He is doing well and becoming more active. He is riding his bike daily and has more energy. He is advised to continue exercise and weight loss regimen.   3. Hypercholesterolemia: Continue statin therapy. He will have fasting lipids and LFTs.   4. PAF: Continue Eliquis for anticoagulation and stroke prevention.    Current medicines are reviewed at length with the patient today.    Labs/ tests ordered today include: Lipids and LFT's.   Phill Myron. West Pugh, ANP, AACC   08/14/2018 3:57 PM    [address] Office [phone] Fax [phone]

## 2018-08-14 ENCOUNTER — Encounter: Payer: Self-pay | Admitting: Adult Health

## 2018-08-14 ENCOUNTER — Ambulatory Visit: Payer: PPO | Admitting: Adult Health

## 2018-08-14 VITALS — BP 100/67 | HR 48 | Ht 72.0 in | Wt 255.3 lb

## 2018-08-14 DIAGNOSIS — E7800 Pure hypercholesterolemia, unspecified: Secondary | ICD-10-CM | POA: Diagnosis not present

## 2018-08-14 DIAGNOSIS — I480 Paroxysmal atrial fibrillation: Secondary | ICD-10-CM

## 2018-08-14 DIAGNOSIS — I5042 Chronic combined systolic (congestive) and diastolic (congestive) heart failure: Secondary | ICD-10-CM | POA: Diagnosis not present

## 2018-08-14 DIAGNOSIS — I2510 Atherosclerotic heart disease of native coronary artery without angina pectoris: Secondary | ICD-10-CM | POA: Diagnosis not present

## 2018-08-14 DIAGNOSIS — Z9861 Coronary angioplasty status: Secondary | ICD-10-CM | POA: Diagnosis not present

## 2018-08-14 NOTE — Patient Instructions (Signed)
Follow-Up: You will need a follow up appointment in 6 months.  Please call our office 2 months  (April 2020)   in advance to schedule this appointment  (June 2020).  You may see Fransico Him, MD Jory Sims, DNP, AACC  or one of the following Advanced Practice Providers on your designated Care Team:  Lyda Jester, PA-C  Dayna Dunn, PA-C  Ermalinda Barrios, PA-C  Medication Instructions:  NO CHANGES- Your physician recommends that you continue on your current medications as directed. Please refer to the Current Medication list given to you today. If you need a refill on your cardiac medications before your next appointment, please call your pharmacy.  Labwork: When you have labs (blood work) and your tests are completely normal, you will receive your results ONLY by [place] (if you have MyChart) -OR- A paper copy in the mail.  At [facility], you and your health needs are our priority.  As part of our continuing mission to provide you with exceptional heart care, we have created designated Provider Care Teams.  These Care Teams include your primary Cardiologist (physician) and Advanced Practice Providers (APPs -  Physician Assistants and Nurse Practitioners) who all work together to provide you with the care you need, when you need it.  Thank you for choosing [facility] at [facility]!!

## 2018-08-23 ENCOUNTER — Telehealth: Payer: Self-pay

## 2018-08-23 NOTE — Telephone Encounter (Signed)
**Note De-Identified Sherrel Ploch Obfuscation** I have done a Entresto PA through covermymeds. KTC:[id number]

## 2018-08-28 NOTE — Telephone Encounter (Addendum)
Letter received from [facility] stating that the coverage determination for Entresto already has an approval in effect.  I called [facility] (the pharmacy we sent the pts RX to be filled) and was advised that the pt transferred his prescriptions to another pharmacy and that they never filled this RX. The pharmacist offered to run the RX to see if a PA is needed on the pts Entresto. I requested that she run it. Per the pharmacist the pt purchased his last refill on 08/22/2018 (unknown pharmacy) and the message she is receiving is that it is too soon to refill.  I then called [facility] and s/w Cloyde Reams who states that they are the pharmacy that filled the pts last Entresto on 08/22/2018 and that a PA is not required at this time.

## 2018-09-11 DIAGNOSIS — I480 Paroxysmal atrial fibrillation: Secondary | ICD-10-CM | POA: Diagnosis not present

## 2018-09-11 DIAGNOSIS — I10 Essential (primary) hypertension: Secondary | ICD-10-CM | POA: Diagnosis not present

## 2018-09-11 DIAGNOSIS — I509 Heart failure, unspecified: Secondary | ICD-10-CM | POA: Diagnosis not present

## 2018-09-13 DIAGNOSIS — Z79899 Other long term (current) drug therapy: Secondary | ICD-10-CM | POA: Diagnosis not present

## 2018-09-13 DIAGNOSIS — G5603 Carpal tunnel syndrome, bilateral upper limbs: Secondary | ICD-10-CM | POA: Diagnosis not present

## 2018-09-13 DIAGNOSIS — Z0000 Encounter for general adult medical examination without abnormal findings: Secondary | ICD-10-CM | POA: Diagnosis not present

## 2018-09-13 DIAGNOSIS — K219 Gastro-esophageal reflux disease without esophagitis: Secondary | ICD-10-CM | POA: Diagnosis not present

## 2018-09-13 DIAGNOSIS — H6122 Impacted cerumen, left ear: Secondary | ICD-10-CM | POA: Diagnosis not present

## 2018-09-13 DIAGNOSIS — I480 Paroxysmal atrial fibrillation: Secondary | ICD-10-CM | POA: Diagnosis not present

## 2018-09-13 DIAGNOSIS — E7800 Pure hypercholesterolemia, unspecified: Secondary | ICD-10-CM | POA: Diagnosis not present

## 2018-09-13 DIAGNOSIS — Z1389 Encounter for screening for other disorder: Secondary | ICD-10-CM | POA: Diagnosis not present

## 2018-09-13 DIAGNOSIS — I10 Essential (primary) hypertension: Secondary | ICD-10-CM | POA: Diagnosis not present

## 2018-09-13 DIAGNOSIS — I509 Heart failure, unspecified: Secondary | ICD-10-CM | POA: Diagnosis not present

## 2018-09-15 ENCOUNTER — Telehealth: Payer: Self-pay | Admitting: *Deleted

## 2018-09-15 NOTE — Telephone Encounter (Signed)
Informed patient of compliancePatient is aware and agreeable to AHI being within range at 4.3. Patient is aware and agreeable to improving in compliance with machine usage

## 2018-09-15 NOTE — Telephone Encounter (Signed)
-----   Message from Sueanne Margarita, MD sent at 09/14/2018  2:22 PM EST ----- Good AHI on PAP but needs to improve compliance

## 2018-09-20 DIAGNOSIS — H1851 Endothelial corneal dystrophy: Secondary | ICD-10-CM | POA: Diagnosis not present

## 2018-09-20 DIAGNOSIS — Z961 Presence of intraocular lens: Secondary | ICD-10-CM | POA: Diagnosis not present

## 2018-09-20 DIAGNOSIS — Z947 Corneal transplant status: Secondary | ICD-10-CM | POA: Diagnosis not present

## 2018-09-20 DIAGNOSIS — H40009 Preglaucoma, unspecified, unspecified eye: Secondary | ICD-10-CM | POA: Diagnosis not present

## 2018-09-27 ENCOUNTER — Ambulatory Visit (HOSPITAL_COMMUNITY): Payer: PPO | Attending: Cardiology

## 2018-09-27 DIAGNOSIS — E669 Obesity, unspecified: Secondary | ICD-10-CM | POA: Insufficient documentation

## 2018-09-27 DIAGNOSIS — I4891 Unspecified atrial fibrillation: Secondary | ICD-10-CM | POA: Insufficient documentation

## 2018-09-27 DIAGNOSIS — I509 Heart failure, unspecified: Secondary | ICD-10-CM | POA: Diagnosis not present

## 2018-09-27 DIAGNOSIS — Z87891 Personal history of nicotine dependence: Secondary | ICD-10-CM | POA: Insufficient documentation

## 2018-09-27 DIAGNOSIS — I2510 Atherosclerotic heart disease of native coronary artery without angina pectoris: Secondary | ICD-10-CM | POA: Diagnosis not present

## 2018-09-27 DIAGNOSIS — I77810 Thoracic aortic ectasia: Secondary | ICD-10-CM | POA: Diagnosis not present

## 2018-09-27 DIAGNOSIS — I110 Hypertensive heart disease with heart failure: Secondary | ICD-10-CM | POA: Insufficient documentation

## 2018-09-27 DIAGNOSIS — E785 Hyperlipidemia, unspecified: Secondary | ICD-10-CM | POA: Insufficient documentation

## 2018-09-28 ENCOUNTER — Encounter: Payer: Self-pay | Admitting: Cardiology

## 2018-09-29 ENCOUNTER — Telehealth: Payer: Self-pay

## 2018-09-29 DIAGNOSIS — I77810 Thoracic aortic ectasia: Secondary | ICD-10-CM

## 2018-09-29 NOTE — Telephone Encounter (Signed)
-----   Message from Sueanne Margarita, MD sent at 09/28/2018 10:52 PM EST ----- Echo showed mildly reduced LVF with EF 40-45%, trivial AR and moderate calcification of the AV with no AS and mildly dilated aortic root - repeat echo in 1 year for dilated aorta

## 2018-09-29 NOTE — Telephone Encounter (Signed)
Spoke with the patient, he expressed understanding and had no further questions. Repeat echo ordered for around 09/30/19.

## 2018-10-04 DIAGNOSIS — G5601 Carpal tunnel syndrome, right upper limb: Secondary | ICD-10-CM | POA: Diagnosis not present

## 2018-10-04 DIAGNOSIS — G5603 Carpal tunnel syndrome, bilateral upper limbs: Secondary | ICD-10-CM | POA: Diagnosis not present

## 2018-10-04 DIAGNOSIS — S56419A Strain of extensor muscle, fascia and tendon of finger, unspecified finger at forearm level, initial encounter: Secondary | ICD-10-CM | POA: Diagnosis not present

## 2018-10-04 DIAGNOSIS — G5602 Carpal tunnel syndrome, left upper limb: Secondary | ICD-10-CM | POA: Diagnosis not present

## 2018-10-11 DIAGNOSIS — H6122 Impacted cerumen, left ear: Secondary | ICD-10-CM | POA: Diagnosis not present

## 2018-10-13 DIAGNOSIS — E7800 Pure hypercholesterolemia, unspecified: Secondary | ICD-10-CM | POA: Diagnosis not present

## 2018-10-13 DIAGNOSIS — I10 Essential (primary) hypertension: Secondary | ICD-10-CM | POA: Diagnosis not present

## 2018-10-13 DIAGNOSIS — I480 Paroxysmal atrial fibrillation: Secondary | ICD-10-CM | POA: Diagnosis not present

## 2018-10-13 DIAGNOSIS — I509 Heart failure, unspecified: Secondary | ICD-10-CM | POA: Diagnosis not present

## 2018-11-03 DIAGNOSIS — I10 Essential (primary) hypertension: Secondary | ICD-10-CM | POA: Diagnosis not present

## 2018-11-03 DIAGNOSIS — I509 Heart failure, unspecified: Secondary | ICD-10-CM | POA: Diagnosis not present

## 2018-11-03 DIAGNOSIS — I480 Paroxysmal atrial fibrillation: Secondary | ICD-10-CM | POA: Diagnosis not present

## 2018-11-23 ENCOUNTER — Other Ambulatory Visit: Payer: Self-pay | Admitting: Cardiology

## 2018-11-23 MED ORDER — CLOPIDOGREL BISULFATE 75 MG PO TABS: 75.0000 mg | ORAL_TABLET | Freq: Every day | ORAL | 0 refills | Status: DC

## 2018-11-23 NOTE — Telephone Encounter (Signed)
Per phar call stated need CLOPIDOGREL 75 mg refilled please.  Give them a call with any questions.

## 2018-11-27 DIAGNOSIS — I480 Paroxysmal atrial fibrillation: Secondary | ICD-10-CM | POA: Diagnosis not present

## 2018-11-27 DIAGNOSIS — I10 Essential (primary) hypertension: Secondary | ICD-10-CM | POA: Diagnosis not present

## 2018-11-27 DIAGNOSIS — I509 Heart failure, unspecified: Secondary | ICD-10-CM | POA: Diagnosis not present

## 2019-01-12 DIAGNOSIS — I509 Heart failure, unspecified: Secondary | ICD-10-CM | POA: Diagnosis not present

## 2019-01-12 DIAGNOSIS — I10 Essential (primary) hypertension: Secondary | ICD-10-CM | POA: Diagnosis not present

## 2019-01-12 DIAGNOSIS — I480 Paroxysmal atrial fibrillation: Secondary | ICD-10-CM | POA: Diagnosis not present

## 2019-01-24 DIAGNOSIS — Z961 Presence of intraocular lens: Secondary | ICD-10-CM | POA: Diagnosis not present

## 2019-01-24 DIAGNOSIS — Z947 Corneal transplant status: Secondary | ICD-10-CM | POA: Diagnosis not present

## 2019-01-24 DIAGNOSIS — H1851 Endothelial corneal dystrophy: Secondary | ICD-10-CM | POA: Diagnosis not present

## 2019-01-24 DIAGNOSIS — H40003 Preglaucoma, unspecified, bilateral: Secondary | ICD-10-CM | POA: Diagnosis not present

## 2019-01-26 DIAGNOSIS — I480 Paroxysmal atrial fibrillation: Secondary | ICD-10-CM | POA: Diagnosis not present

## 2019-01-26 DIAGNOSIS — I509 Heart failure, unspecified: Secondary | ICD-10-CM | POA: Diagnosis not present

## 2019-01-26 DIAGNOSIS — I10 Essential (primary) hypertension: Secondary | ICD-10-CM | POA: Diagnosis not present

## 2019-02-21 ENCOUNTER — Other Ambulatory Visit: Payer: Self-pay | Admitting: Cardiology

## 2019-02-22 ENCOUNTER — Other Ambulatory Visit: Payer: Self-pay | Admitting: *Deleted

## 2019-02-22 MED ORDER — CLOPIDOGREL BISULFATE 75 MG PO TABS: 75.0000 mg | ORAL_TABLET | Freq: Every day | ORAL | 0 refills | Status: DC

## 2019-02-22 MED ORDER — METOPROLOL SUCCINATE ER 50 MG PO TB24: 50.0000 mg | ORAL_TABLET | Freq: Every day | ORAL | 0 refills | Status: DC

## 2019-02-26 DIAGNOSIS — I10 Essential (primary) hypertension: Secondary | ICD-10-CM | POA: Diagnosis not present

## 2019-02-26 DIAGNOSIS — I480 Paroxysmal atrial fibrillation: Secondary | ICD-10-CM | POA: Diagnosis not present

## 2019-02-26 DIAGNOSIS — I509 Heart failure, unspecified: Secondary | ICD-10-CM | POA: Diagnosis not present

## 2019-03-19 DIAGNOSIS — I480 Paroxysmal atrial fibrillation: Secondary | ICD-10-CM | POA: Diagnosis not present

## 2019-03-19 DIAGNOSIS — I2510 Atherosclerotic heart disease of native coronary artery without angina pectoris: Secondary | ICD-10-CM | POA: Diagnosis not present

## 2019-03-19 DIAGNOSIS — I10 Essential (primary) hypertension: Secondary | ICD-10-CM | POA: Diagnosis not present

## 2019-03-19 DIAGNOSIS — I509 Heart failure, unspecified: Secondary | ICD-10-CM | POA: Diagnosis not present

## 2019-04-16 ENCOUNTER — Ambulatory Visit: Payer: PPO | Admitting: Cardiology

## 2019-04-20 DIAGNOSIS — I509 Heart failure, unspecified: Secondary | ICD-10-CM | POA: Diagnosis not present

## 2019-04-20 DIAGNOSIS — I129 Hypertensive chronic kidney disease with stage 1 through stage 4 chronic kidney disease, or unspecified chronic kidney disease: Secondary | ICD-10-CM | POA: Diagnosis not present

## 2019-04-20 DIAGNOSIS — I480 Paroxysmal atrial fibrillation: Secondary | ICD-10-CM | POA: Diagnosis not present

## 2019-04-20 DIAGNOSIS — I2510 Atherosclerotic heart disease of native coronary artery without angina pectoris: Secondary | ICD-10-CM | POA: Diagnosis not present

## 2019-05-01 DIAGNOSIS — I509 Heart failure, unspecified: Secondary | ICD-10-CM | POA: Diagnosis not present

## 2019-05-01 DIAGNOSIS — I10 Essential (primary) hypertension: Secondary | ICD-10-CM | POA: Diagnosis not present

## 2019-05-01 DIAGNOSIS — Z79899 Other long term (current) drug therapy: Secondary | ICD-10-CM | POA: Diagnosis not present

## 2019-05-01 DIAGNOSIS — E7800 Pure hypercholesterolemia, unspecified: Secondary | ICD-10-CM | POA: Diagnosis not present

## 2019-05-01 DIAGNOSIS — I480 Paroxysmal atrial fibrillation: Secondary | ICD-10-CM | POA: Diagnosis not present

## 2019-05-01 DIAGNOSIS — D6869 Other thrombophilia: Secondary | ICD-10-CM | POA: Diagnosis not present

## 2019-05-17 DIAGNOSIS — Z79899 Other long term (current) drug therapy: Secondary | ICD-10-CM | POA: Diagnosis not present

## 2019-05-17 DIAGNOSIS — E7800 Pure hypercholesterolemia, unspecified: Secondary | ICD-10-CM | POA: Diagnosis not present

## 2019-05-17 DIAGNOSIS — Z23 Encounter for immunization: Secondary | ICD-10-CM | POA: Diagnosis not present

## 2019-05-21 ENCOUNTER — Other Ambulatory Visit: Payer: Self-pay | Admitting: Cardiology

## 2019-05-21 ENCOUNTER — Ambulatory Visit: Payer: PPO | Admitting: Cardiology

## 2019-05-21 MED ORDER — CLOPIDOGREL BISULFATE 75 MG PO TABS: 75.0000 mg | ORAL_TABLET | Freq: Every day | ORAL | 0 refills | Status: DC

## 2019-05-21 NOTE — Telephone Encounter (Signed)
°*  STAT* If patient is at the pharmacy, call can be transferred to refill team.   1. Which medications need to be refilled? (please list name of each medication and dose if known) clopidogrel (PLAVIX) 75 MG tablet  2. Which pharmacy/location (including street and city if local pharmacy) is medication to be sent to? [facility] - [place], [place] - [address]  3. Do they need a 30 day or 90 day supply? 90  

## 2019-05-21 NOTE — Telephone Encounter (Signed)
Pt's medication was sent to pt's pharmacy as requested. Confirmation received.  °

## 2019-05-22 ENCOUNTER — Telehealth: Payer: Self-pay | Admitting: Cardiology

## 2019-05-22 ENCOUNTER — Other Ambulatory Visit: Payer: Self-pay | Admitting: Cardiology

## 2019-05-22 MED ORDER — METOPROLOL SUCCINATE ER 50 MG PO TB24: 50.0000 mg | ORAL_TABLET | Freq: Every day | ORAL | 0 refills | Status: DC

## 2019-05-22 NOTE — Telephone Encounter (Signed)
Ok to refill Toprol.  Ok to set up appt in person with next available

## 2019-05-22 NOTE — Telephone Encounter (Signed)
Outpatient Medication Detail   Disp Refills Start End   metoprolol succinate (TOPROL-XL) 50 MG 24 hr tablet 90 tablet 0 05/22/2019    Sig - Route: Take 1 tablet (50 mg total) by mouth daily. Take with or immediately following a meal. Please make yearly appt with Dr. Radford Pax for December. 1st attempt - Oral   Sent to pharmacy as: metoprolol succinate (TOPROL-XL) 50 MG 24 hr tablet   Notes to Pharmacy: Please call our office to schedule an yearly appointment with Dr. Radford Pax for December before anymore refills. [phone]. Thank you 1st attempt   E-Prescribing Status: Receipt confirmed by pharmacy (05/22/2019 8:30 AM EDT)   Pharmacy  [place], [address]

## 2019-05-22 NOTE — Telephone Encounter (Signed)
Pt's medication was sent to pt's pharmacy as requested. Confirmation received.  °

## 2019-05-22 NOTE — Telephone Encounter (Signed)
[facility] Rep calling stating that pt is requesting a refill on metoprolol 25 mg tablet. This strength is not on pt's medication list. Pt states that taking metoprolol 50 mg tablets daily is making his blood pressure drop, so pt has been taking 25 mg daily. Would Dr. Radford Pax like to change this medication? Please address

## 2019-05-22 NOTE — Telephone Encounter (Signed)
I spoke to the patient and gave him Dr Theodosia Blender recommendation to see him in office at the next available time.

## 2019-05-22 NOTE — Telephone Encounter (Signed)
Followed up with patient who reports that he is taking Toprol 50 mg daily.  States he has not decreased this medication. Rx sent in, from our office, before returning pt call.  Pt reports his disappointment in trying to get an OV w/ Dr. Radford Pax.  States that the past few times he has had an appt with her it has been cancelled. He states that he wants to see Dr. Radford Pax in-office on a day that he won't be cancelled/rescheduled. Pt aware I will forward to [name] to review and advise.   Dr. Heron Nay, please forward response to triage.

## 2019-05-23 DIAGNOSIS — I480 Paroxysmal atrial fibrillation: Secondary | ICD-10-CM | POA: Diagnosis not present

## 2019-05-23 DIAGNOSIS — I129 Hypertensive chronic kidney disease with stage 1 through stage 4 chronic kidney disease, or unspecified chronic kidney disease: Secondary | ICD-10-CM | POA: Diagnosis not present

## 2019-05-23 DIAGNOSIS — I509 Heart failure, unspecified: Secondary | ICD-10-CM | POA: Diagnosis not present

## 2019-05-23 DIAGNOSIS — I2510 Atherosclerotic heart disease of native coronary artery without angina pectoris: Secondary | ICD-10-CM | POA: Diagnosis not present

## 2019-05-23 DIAGNOSIS — E7800 Pure hypercholesterolemia, unspecified: Secondary | ICD-10-CM | POA: Diagnosis not present

## 2019-06-20 DIAGNOSIS — H18519 Endothelial corneal dystrophy, unspecified eye: Secondary | ICD-10-CM | POA: Diagnosis not present

## 2019-06-20 DIAGNOSIS — Z961 Presence of intraocular lens: Secondary | ICD-10-CM | POA: Diagnosis not present

## 2019-06-20 DIAGNOSIS — H40003 Preglaucoma, unspecified, bilateral: Secondary | ICD-10-CM | POA: Diagnosis not present

## 2019-06-20 DIAGNOSIS — Z947 Corneal transplant status: Secondary | ICD-10-CM | POA: Diagnosis not present

## 2019-06-21 ENCOUNTER — Other Ambulatory Visit: Payer: Self-pay | Admitting: Cardiology

## 2019-06-21 NOTE — Telephone Encounter (Signed)
Pt's pharmacy is requesting a refill on Entresto. This medication is no longer on pt's medication list. I called pt and pt stated that he still takes this medication. Would Dr. Radford Pax like to prescribe this medication? Please address

## 2019-07-03 ENCOUNTER — Ambulatory Visit: Payer: PPO | Admitting: Cardiology

## 2019-07-04 ENCOUNTER — Encounter (INDEPENDENT_AMBULATORY_CARE_PROVIDER_SITE_OTHER): Payer: Self-pay

## 2019-07-04 ENCOUNTER — Other Ambulatory Visit: Payer: Self-pay

## 2019-07-04 ENCOUNTER — Encounter: Payer: Self-pay | Admitting: Cardiology

## 2019-07-04 ENCOUNTER — Ambulatory Visit: Payer: PPO | Admitting: Cardiology

## 2019-07-04 VITALS — BP 118/68 | HR 68 | Ht 72.0 in | Wt 265.6 lb

## 2019-07-04 DIAGNOSIS — I77819 Aortic ectasia, unspecified site: Secondary | ICD-10-CM

## 2019-07-04 DIAGNOSIS — I4819 Other persistent atrial fibrillation: Secondary | ICD-10-CM

## 2019-07-04 DIAGNOSIS — I420 Dilated cardiomyopathy: Secondary | ICD-10-CM | POA: Diagnosis not present

## 2019-07-04 DIAGNOSIS — G4733 Obstructive sleep apnea (adult) (pediatric): Secondary | ICD-10-CM

## 2019-07-04 DIAGNOSIS — E785 Hyperlipidemia, unspecified: Secondary | ICD-10-CM

## 2019-07-04 DIAGNOSIS — I5042 Chronic combined systolic (congestive) and diastolic (congestive) heart failure: Secondary | ICD-10-CM | POA: Diagnosis not present

## 2019-07-04 DIAGNOSIS — I10 Essential (primary) hypertension: Secondary | ICD-10-CM | POA: Diagnosis not present

## 2019-07-04 DIAGNOSIS — I25119 Atherosclerotic heart disease of native coronary artery with unspecified angina pectoris: Secondary | ICD-10-CM

## 2019-07-04 NOTE — Patient Instructions (Signed)
Medication Instructions:  Your provider recommends that you continue on your current medications as directed. Please refer to the Current Medication list given to you today.   *If you need a refill on your cardiac medications before your next appointment, please call your pharmacy*  Testing/Procedures: Your physician has requested that you have an echocardiogram in February, 2021. Echocardiography is a painless test that uses sound waves to create images of your heart. It provides your doctor with information about the size and shape of your heart and how well your heart's chambers and valves are working. This procedure takes approximately one hour. There are no restrictions for this procedure.  Follow-Up: At [facility], you and your health needs are our priority.  As part of our continuing mission to provide you with exceptional heart care, we have created designated Provider Care Teams.  These Care Teams include your primary Cardiologist (physician) and Advanced Practice Providers (APPs -  Physician Assistants and Nurse Practitioners) who all work together to provide you with the care you need, when you need it. Your next appointment:   6 month(s) The format for your next appointment:   In Person Provider:   You may see Fransico Him, MD or one of the following Advanced Practice Providers on your designated Care Team:    Melina Copa, PA-C  Ermalinda Barrios, PA-C

## 2019-07-04 NOTE — Progress Notes (Signed)
Cardiology Office Note:    Date:  07/04/2019   ID:  [name], DOB [birth year], MRN [id number]  PCP:  Lajean Manes, MD  Cardiologist:  Fransico Him, MD    Referring MD: Lajean Manes, MD   No chief complaint on file.   History of Present Illness:    [name] ORTEZ is a 84 y.o. male with a hx of  PAF s/p DCCV 10/2014,dilated aortic root at (4.4 cm) and ascending aorta (4.0 cm)on most recent assessment. He has h/o systolic HF, ASCAD by cath with30% LM at the ostium, 70-80% ostial D2, 30-40% prox LCX, 30-40% prox and 40% mid RCA and is on medical management. He is on chronic anticoagulation therapy with Eliquis. He was previously on amiodarone for his afib, however this was discontinued 10/2016 due to intolerances (visual disturbances and bilateral hand numbness) and now has permanent atrial fibrillation.   2D echo 10/2017 showed a decline in EF with moderate reduced LVF with EF 35-40% and moderately dilated aortic root at 46 mm.  He underwent cath showing an occluded RPDA which filled via left to right collaterals, 85% chronic stenosis of oD2, 99% mid RCA, 30% proximal to mid LAD, 30% proximal to distal left circumflex and moderate LV dysfunction with EF 25-35%. He underwent PCI with drug-eluting stent to the mid RCA. He also was noted to have moderate pulmonary hypertension with an LVEDP of 29 and a pulmonary capillary wedge closure to 40. It was felt that he likely had a combination of group 2 and group 3 pulmonary hypertension secondary to obstructive sleep apnea as well as congestive heart failure with pulmonary venous hypertension.  Repeat echo 09/2018 showed an improvement in LVF with E 40-45% with mild to moderate diffuse LV dysfunction and 41m aortic root at sinuses of Valsalva.  Chest CTA the year before showed 48mSinus of Valsalva.    He is here today for followup and is doing well.  He denies any chest pain or pressure, PND, orthopnea, LE edema, dizziness, palpitations or  syncope. He has some mild DOE at times but has gained 10lbs since I saw him last.  He is compliant with his meds and is tolerating meds with no SE.  He is doing well with his PAP therapy.  He feels rested for the most part in the am but does nap some.  He has no significant problems with mouth dryness.    Past Medical History:  Diagnosis Date  . Benign essential HTN 09/17/2014  . CAD (coronary artery disease), native coronary artery 09/20/2014   30% LM at the ostium, 70-80% ostial D2, 30-40% prox LCX, 30-40% prox and 40% mid RCA and EF 40%. 11/07/17 DES to RCA   . Chronic lower back pain   . DCM (dilated cardiomyopathy) ([facility]2/09/2014   a.  EF 40%; b. Echo 5/16:  EF 40-45%, diff HK, inf AK, mild AI, MAC, severe LAE  . Dilated aortic root ([facility]/09/2014   4175mt SInus of Valsalva on echo 09/2018  . DJD (degenerative joint disease)    feet, knees and back  . GERD (gastroesophageal reflux disease)    hiatal hernia  . Hepatitis 1953   "A or B; not sure which"  . High cholesterol   . History of blood transfusion 1954   "related to hip OR"  . History of hip surgery 1954   "knocked hips out of joints; had to have bone grafts; both sides"  . Hyperlipidemia LDL goal <70 10/14/2015  .  Lesion of left femoral nerve    3 cm MRI 04/2010 consistent with hemangioma  . Mild aortic insufficiency   . Obesity   . OSA (obstructive sleep apnea) 10/14/2015   Mild with AHI 10.6/hr overall and 38/hr during REM sleep.  On CPAP at 9cm H2O  . OSA on CPAP   . Osteoarthritis    "all over" (11/07/2017)  . PAF (paroxysmal atrial fibrillation) ([place]) 09/17/2014  . PSA (psoriatic arthritis) (HCC)    Mild increase 3.65 on 05/2011 stable 3.68 on 09/2011  . Renal cyst, left 2010   5 cm seen on ultrasound     Past Surgical History:  Procedure Laterality Date  . APPENDECTOMY    . BACK SURGERY    . CARDIOVERSION N/A 11/04/2014   Procedure: CARDIOVERSION;  Surgeon: Sueanne Margarita, MD;  Location[facility] ENDOSCOPY;   Service: Cardiovascular;  Laterality: N/A;  . CARDIOVERSION N/A 01/09/2015   Procedure: CARDIOVERSION;  Surgeon: Pixie Casino, MD;  Location: [facility] ENDOSCOPY;  Service: Cardiovascular;  Laterality: N/A;  . CATARACT EXTRACTION W/ INTRAOCULAR LENS  IMPLANT, BILATERAL Bilateral   . CORONARY STENT INTERVENTION N/A 11/07/2017   Procedure: CORONARY STENT INTERVENTION;  Surgeon: Leonie Man, MD;  Location: [place] CV LAB;  Service: Cardiovascular;  Laterality: N/A;  . HERNIA REPAIR    . JOINT REPLACEMENT    . LAPAROSCOPIC CHOLECYSTECTOMY    . LEFT HEART CATHETERIZATION WITH CORONARY ANGIOGRAM N/A 10/01/2014   Procedure: LEFT HEART CATHETERIZATION WITH CORONARY ANGIOGRAM;  Surgeon: Troy Sine, MD;  Location[facility] CATH LAB;  Service: Cardiovascular;  Laterality: N/A;  . LUMBAR LAMINECTOMY    . REVISION TOTAL HIP ARTHROPLASTY Right   . RIGHT/LEFT HEART CATH AND CORONARY ANGIOGRAPHY N/A 11/07/2017   Procedure: RIGHT/LEFT HEART CATH AND CORONARY ANGIOGRAPHY;  Surgeon: Leonie Man, MD;  Location: [place] CV LAB;  Service: Cardiovascular;  Laterality: N/A;  . SHOULDER OPEN ROTATOR CUFF REPAIR Bilateral   . TONSILLECTOMY    . TOTAL HIP ARTHROPLASTY Bilateral   . TOTAL KNEE ARTHROPLASTY Bilateral   . UMBILICAL HERNIA REPAIR      Current Medications: Current Meds  Medication Sig  . acetaminophen (TYLENOL) 650 MG CR tablet Take 650 mg by mouth every 8 (eight) hours as needed for pain.  Marland Kitchen apixaban (ELIQUIS) 5 MG TABS tablet Take 1 tablet (5 mg total) by mouth 2 (two) times daily.  . clopidogrel (PLAVIX) 75 MG tablet Take 1 tablet (75 mg total) by mouth daily with breakfast. Please make yearly appt with Dr. Radford Pax for December before anymore refills. 1st attempt  . ENTRESTO 49-51 MG TAKE 1 TABLET BY MOUTH TWICE DAILY  . ezetimibe (ZETIA) 10 MG tablet Take 1 tablet (10 mg total) by mouth daily.  . metoprolol succinate (TOPROL-XL) 50 MG 24 hr tablet Take 1 tablet (50 mg total) by mouth daily.  Take with or immediately following a meal. Please make yearly appt with Dr. Radford Pax for December. 1st attempt  . MULTIPLE VITAMINS PO Take 1 tablet by mouth daily.  . pantoprazole (PROTONIX) 40 MG tablet Take 1 tablet (40 mg total) by mouth daily.  . rosuvastatin (CRESTOR) 40 MG tablet TAKE ONE TABLET BY MOUTH DAILY  . spironolactone (ALDACTONE) 25 MG tablet Take 0.5 tablets (12.5 mg total) by mouth daily.  Marland Kitchen torsemide (DEMADEX) 20 MG tablet Take 20 mg by mouth daily.  . traMADol (ULTRAM) 50 MG tablet Take 50 mg by mouth every 6 (six) hours as needed for moderate pain.  Marland Kitchen  triamcinolone cream (KENALOG) 0.1 %      Allergies:   Simvastatin, Codeine phosphate, and Penicillins   Social History   Socioeconomic History  . Marital status: Married    Spouse name: Not on file  . Number of children: 4  . Years of education: Not on file  . Highest education level: Not on file  Occupational History  . Occupation: Naval architect  Social Needs  . Financial resource strain: Not on file  . Food insecurity    Worry: Not on file    Inability: Not on file  . Transportation needs    Medical: Not on file    Non-medical: Not on file  Tobacco Use  . Smoking status: Former Smoker    Packs/day: 1.00    Years: 30.00    Pack years: 30.00    Types: Cigarettes  . Smokeless tobacco: Never Used  . Tobacco comment: "quit smoking in the 1980s"  Substance and Sexual Activity  . Alcohol use: No  . Drug use: No  . Sexual activity: Yes  Lifestyle  . Physical activity    Days per week: Not on file    Minutes per session: Not on file  . Stress: Not on file  Relationships  . Social Herbalist on phone: Not on file    Gets together: Not on file    Attends religious service: Not on file    Active member of club or organization: Not on file    Attends meetings of clubs or organizations: Not on file    Relationship status: Not on file  Other Topics Concern  . Not on file  Social History  Narrative  . Not on file     Family History: The patient's family history includes Atrial fibrillation in his sister; Cancer in his mother; Diabetes in his father; Heart attack in his maternal grandmother, paternal grandfather, and paternal grandmother; Heart disease in his sister; Stroke in his maternal grandfather.  ROS:   Please see the history of present illness.    ROS  All other systems reviewed and negative.   EKGs/Labs/Other Studies Reviewed:    The following studies were reviewed today: none  EKG:  EKG is  ordered today.  The ekg ordered today demonstrates atrial fibrillation with CVR and PVCs   Recent Labs: 08/04/2018: BUN 39; Creatinine, Ser 1.55; Potassium 4.9; Sodium 136   Recent Lipid Panel    Component Value Date/Time   CHOL 149 10/20/2017 1220   TRIG 96 10/20/2017 1220   HDL 49 10/20/2017 1220   CHOLHDL 3.0 10/20/2017 1220   CHOLHDL 2.5 05/10/2016 1145   VLDL 19 05/10/2016 1145   LDLCALC 81 10/20/2017 1220    Physical Exam:    VS:  BP 118/68   Pulse 68   Ht 6' (1.829 m)   Wt 265 lb 9.6 oz (120.5 kg)   SpO2 98%   BMI 36.02 kg/m     Wt Readings from Last 3 Encounters:  07/04/19 265 lb 9.6 oz (120.5 kg)  08/14/18 255 lb 4.8 oz (115.8 kg)  08/01/18 261 lb 3.2 oz (118.5 kg)     GEN:  Well nourished, well developed in no acute distress HEENT: Normal NECK: No JVD; No carotid bruits LYMPHATICS: No lymphadenopathy CARDIAC: RRR, no murmurs, rubs, gallops RESPIRATORY:  Clear to auscultation without rales, wheezing or rhonchi  ABDOMEN: Soft, non-tender, non-distended MUSCULOSKELETAL:  No edema; No deformity  SKIN: Warm and dry NEUROLOGIC:  Alert and oriented x  3 PSYCHIATRIC:  Normal affect   ASSESSMENT:    1. Persistent atrial fibrillation ([place])   2. DCM (dilated cardiomyopathy) ([place])   3. Coronary artery disease involving native coronary artery of native heart with angina pectoris ([place])   4. Chronic combined systolic and diastolic CHF  (congestive heart failure) ([place])   5. Dilatation of aorta (HCC)   6. Benign essential HTN   7. OSA (obstructive sleep apnea)   8. Hyperlipidemia LDL goal <70    PLAN:    In order of problems listed above:  1.Permanent atrial fibrillation -he remains in atrial fibrillation today with controlled ventricular response.   -he denies any palpitations and HR is well controlled on Toprol XL 11m daily -he is currently on apixaban 576mBID but weight>60kg,creatinine 1.54 and age > 8052 -I will repeat BMET and if Creatinine remains > 1.5 then decrease Eliquis to 2.74m41mID. -he has not had any significant bleeding problems and Hbg was 24.5 last month -creatinine stable at 1.54  2. DCM -echo 2019 showed decline in EF to 35-40% prompting cath -Repeat heart cath showed an ostial D2 85% lesion, occluded RPDA, 99% mid RCA and he underwent PCI DES to the mid RCA in March 2019.   -repeat 2D echo 09/2018 showed improvement in LVF with EF 40-45% -Continue Entresto 49-74m3mD, spironolactone 12.74mg 49mly, Torsemide 20mg 68my and Toprol XL 50mg d74m.   3. ASCAD -Cath showed an occluded RPDA which filled via left to right collaterals, 85% chronic stenosis of ostial D2, 99% mid RCA, 30% proximal to mid LAD, 30% proximal to distal left circumflex and moderate LV dysfunction with EF 25-35% echo showed EF 35 to 40%. -s/p PCI with drug-eluting stent to the mid RCA.   -denies any anginal  symptoms.  -no ASA due to DOAC -continue Plavix 774mg da574m BB and statin  4.Chronic combined systolic/diastolic CHF -he appears euvolemic on exam today -Echo 10/20/2017 showed EF 35 to 40% and improvement to 40-45% on echo 09/2018.  -continue Torsemide 20mg dai42mbeta-blocker, Entresto and spironolactone 12.5 mg daily.    5.Dilated aortic root -dilated at sinuses of Valsalvaat 41mm by e71m2/2020 - Chest CT angio 09/30/2017 showed dilatation of the aorta at the sinuses of Valsalva at 4.4 cm and 4.2 cm in  the ascending thoracic aorta.  -repeat chest CTA in 09/2019 to reassess -Continue statin  5. HTN  -blood pressure is well controlled on exam today.  -Continue on Toprol-XL 50 mg daily, Entresto and spiro -he is going to go home and verify the dose of Toprol he is taking now since his BPs have been soft at times and will call us to let us know thKoreadose  6.OSA -the patient is tolerating PAP therapy well without any problems.  -The PAP download was reviewed today and showed an AHI of 11/hr on 9 cm H2O with 100% compliance in using more than 4 hours nightly.  The patient has been using and benefiting from PAP use and will continue to benefit from therapy.  -He has gained 10lbs since I saw him last and likely explains the increased AHI -will place him on auto CPAP form 4 to 20cm H2O and get a download in 4 weeks  6. Hyperlpidemia with LDL goal < 70. -He will continue on Crestor 40 mg daily Zetia 10 mg daily.    -His LDL was 44 last month and  ALT 13.   I have spent a total of 35 minutes with patient reviewing  chest CT, 2D echo , telemetry, EKGs, labs and examining patient as well as establishing an assessment and plan that was discussed with the patient.  > 50% of time was spent in direct patient care.     Medication Adjustments/Labs and Tests Ordered: Current medicines are reviewed at length with the patient today.  Concerns regarding medicines are outlined above.  Orders Placed This Encounter  Procedures  . For home use only DME continuous positive airway pressure (CPAP)  . EKG 12-Lead  . ECHO LIMITED   No orders of the defined types were placed in this encounter.   Signed, Fransico Him, MD  07/04/2019 8:20 PM    [place]

## 2019-07-05 ENCOUNTER — Telehealth: Payer: Self-pay | Admitting: Cardiology

## 2019-07-05 NOTE — Telephone Encounter (Signed)
New message     Patient calling to report he takes Metoprolol 25mg  daily

## 2019-07-05 NOTE — Telephone Encounter (Signed)
Called and made patient aware or recommendations below.

## 2019-07-05 NOTE — Telephone Encounter (Signed)
Called and spoke to patient. He was calling to report that he was only taking metoprolol succinate (Toprol-XL) 25 mg QD instead of 50 mg QD. Med list has been updated. Patient seen yesterday and BP was 118/68 HR 68. Made patient aware that we would call with any changes, otherwise continue current regimen. Will forward to Dr. Radford Pax as Juluis Rainier.

## 2019-07-05 NOTE — Telephone Encounter (Signed)
Please have him check his BP and HR daily for a week around lunch daily and call with results

## 2019-07-11 ENCOUNTER — Other Ambulatory Visit: Payer: Self-pay | Admitting: Cardiology

## 2019-07-11 ENCOUNTER — Telehealth: Payer: Self-pay | Admitting: Cardiology

## 2019-07-11 NOTE — Telephone Encounter (Signed)
Patient called stating he was told to keep a record of his blood pressure for a week and call with the readings.  11/19 110/66 pr 54 11/20  95/51  Pr 64 11/21  97/52  Pr 65 11/23 95/58  Pr 76 11/24 100/69  Pr 55 11/25 111/86  Pr 75 Didn't take blood pressure on 11/22

## 2019-07-13 NOTE — Telephone Encounter (Signed)
Decrease Toprol XL to 12.5mg  daily and check BP and HR daily for a week and call with results

## 2019-07-16 DIAGNOSIS — I2510 Atherosclerotic heart disease of native coronary artery without angina pectoris: Secondary | ICD-10-CM | POA: Diagnosis not present

## 2019-07-16 DIAGNOSIS — I129 Hypertensive chronic kidney disease with stage 1 through stage 4 chronic kidney disease, or unspecified chronic kidney disease: Secondary | ICD-10-CM | POA: Diagnosis not present

## 2019-07-16 DIAGNOSIS — I480 Paroxysmal atrial fibrillation: Secondary | ICD-10-CM | POA: Diagnosis not present

## 2019-07-16 DIAGNOSIS — E7800 Pure hypercholesterolemia, unspecified: Secondary | ICD-10-CM | POA: Diagnosis not present

## 2019-07-16 DIAGNOSIS — I509 Heart failure, unspecified: Secondary | ICD-10-CM | POA: Diagnosis not present

## 2019-07-16 MED ORDER — METOPROLOL SUCCINATE ER 25 MG PO TB24: 12.5000 mg | ORAL_TABLET | Freq: Every day | ORAL | 3 refills | Status: DC

## 2019-07-16 NOTE — Telephone Encounter (Signed)
I spoke to the patient with Dr Theodosia Blender recommendation to reduce Metoprolol Succinate to 12.5 mg Daily.  He verbalized understanding.

## 2019-07-23 ENCOUNTER — Telehealth: Payer: Self-pay | Admitting: Cardiology

## 2019-07-23 NOTE — Telephone Encounter (Signed)
Patient called to report BP readings after reducing his dose of Toprol to 12.5 mg daily. Patient has been feeling fine and has no complaints. Will send to Dr. Radford Pax to review.

## 2019-07-23 NOTE — Telephone Encounter (Signed)
OK.  Please have patient call if he has any dizziness, syncope or weakness and continue to followup BP and call if SBP consisently below 42mmHg.

## 2019-07-23 NOTE — Telephone Encounter (Signed)
Pt c/o BP issue: STAT if pt c/o blurred vision, one-sided weakness or slurred speech  1. What are your last 5 BP readings?  07/17/19 96/78 HR 71 1202/20 116/73 HR 77 07/19/19 95/58 HR 77 07/20/19 95/67 HR 77 07/21/19 110/68 HR 81 07/22/19 103/58 HR 61 07/23/19 112/66 HR 75  2. Are you having any other symptoms (ex. Dizziness, headache, blurred vision, passed out)? No  3. What is your BP issue? Patient called stating Dr. Radford Pax had requested patient to keep log of BP readings for the past week. Please Advise.

## 2019-07-23 NOTE — Telephone Encounter (Signed)
BP still on the low side. Decrease Entresto to 24-26mg  BID and check BP and HR daily for a week and call with results

## 2019-07-23 NOTE — Telephone Encounter (Signed)
Patient declined to reduce his dose of Entresto. He just got a 6 month supply of his current dose and it was very expensive.

## 2019-07-23 NOTE — Telephone Encounter (Signed)
Gave patient Dr. Theodosia Blender recommendation and he verbalized understanding. He will call if he experiences any symptoms and will continue to monitor his BP and let us know if systolicis consistently below 95.

## 2019-07-24 ENCOUNTER — Telehealth: Payer: Self-pay

## 2019-07-24 NOTE — Telephone Encounter (Signed)
Letter received from [place] [facility] program stating that they have approved the pt for pt asst with his Eliquis. Approval is good until 08/16/2019.  Application Case#: [id number]

## 2019-07-27 ENCOUNTER — Other Ambulatory Visit: Payer: Self-pay

## 2019-07-27 DIAGNOSIS — Z20822 Contact with and (suspected) exposure to covid-19: Secondary | ICD-10-CM

## 2019-07-28 LAB — NOVEL CORONAVIRUS, NAA: SARS-CoV-2, NAA: NOT DETECTED

## 2019-07-31 ENCOUNTER — Telehealth: Payer: Self-pay

## 2019-07-31 NOTE — Telephone Encounter (Signed)
Given COVID 19 results, verbalizes understanding. 

## 2019-08-09 ENCOUNTER — Telehealth: Payer: Self-pay | Admitting: *Deleted

## 2019-08-09 DIAGNOSIS — G4733 Obstructive sleep apnea (adult) (pediatric): Secondary | ICD-10-CM

## 2019-08-09 NOTE — Telephone Encounter (Signed)
-----   Message from Sueanne Margarita, MD sent at 08/07/2019  6:28 PM EST ----- Regarding: RE: Please advise Please order a ResMed auto CPAP set at 4 to 20cm H2O and get a download in 2 weeks  Fransico Him ----- Message ----- From: Freada Bergeron, CMA Sent: 08/07/2019   5:59 PM EST To: Sueanne Margarita, MD Subject: Please advise                                  [name] DOB: [birth year] Received: Today Message Contents  Lucile Crater, CMA; Stenson, Melissa Good Morning Gae Bon, we received an order to change this patient's CPAP to an auto setting and unfortunately, the patient has a CPAP that is no auto capable.   We will need:  1. Order for set pressure setting  OR  2. Order for 2 week auto w/pressure setting  Please advise when order is available and I will pull it.  Thank you  Sonia Baller

## 2019-08-09 NOTE — Telephone Encounter (Signed)
Order placed to [facility]

## 2019-08-14 ENCOUNTER — Telehealth: Payer: Self-pay | Admitting: Cardiology

## 2019-08-14 ENCOUNTER — Other Ambulatory Visit: Payer: Self-pay

## 2019-08-14 DIAGNOSIS — I480 Paroxysmal atrial fibrillation: Secondary | ICD-10-CM | POA: Diagnosis not present

## 2019-08-14 DIAGNOSIS — I2510 Atherosclerotic heart disease of native coronary artery without angina pectoris: Secondary | ICD-10-CM | POA: Diagnosis not present

## 2019-08-14 DIAGNOSIS — I129 Hypertensive chronic kidney disease with stage 1 through stage 4 chronic kidney disease, or unspecified chronic kidney disease: Secondary | ICD-10-CM | POA: Diagnosis not present

## 2019-08-14 DIAGNOSIS — I509 Heart failure, unspecified: Secondary | ICD-10-CM | POA: Diagnosis not present

## 2019-08-14 DIAGNOSIS — E7800 Pure hypercholesterolemia, unspecified: Secondary | ICD-10-CM | POA: Diagnosis not present

## 2019-08-14 MED ORDER — TORSEMIDE 20 MG PO TABS: 20.0000 mg | ORAL_TABLET | Freq: Every day | ORAL | 6 refills | Status: DC

## 2019-08-14 NOTE — Telephone Encounter (Signed)
*  STAT* If patient is at the pharmacy, call can be transferred to refill team.   1. Which medications need to be refilled? (please list name of each medication and dose if known) torsemide (DEMADEX) 20 MG tablet  2. Which pharmacy/location (including street and city if local pharmacy) is medication to be sent to? [facility] - [place], [place] - [place] [address]  3. Do they need a 30 day or 90 day supply? 30 day  Patient needs a new prescription sent to [facility]. He is expecting his medication in the next couple of days.

## 2019-08-31 DIAGNOSIS — G4733 Obstructive sleep apnea (adult) (pediatric): Secondary | ICD-10-CM | POA: Diagnosis not present

## 2019-09-12 DIAGNOSIS — I129 Hypertensive chronic kidney disease with stage 1 through stage 4 chronic kidney disease, or unspecified chronic kidney disease: Secondary | ICD-10-CM | POA: Diagnosis not present

## 2019-09-12 DIAGNOSIS — I509 Heart failure, unspecified: Secondary | ICD-10-CM | POA: Diagnosis not present

## 2019-09-12 DIAGNOSIS — I480 Paroxysmal atrial fibrillation: Secondary | ICD-10-CM | POA: Diagnosis not present

## 2019-09-12 DIAGNOSIS — E7800 Pure hypercholesterolemia, unspecified: Secondary | ICD-10-CM | POA: Diagnosis not present

## 2019-09-13 ENCOUNTER — Ambulatory Visit: Payer: PPO

## 2019-09-20 ENCOUNTER — Ambulatory Visit: Payer: PPO | Attending: Internal Medicine

## 2019-09-20 DIAGNOSIS — Z23 Encounter for immunization: Secondary | ICD-10-CM | POA: Insufficient documentation

## 2019-09-20 NOTE — Progress Notes (Signed)
   Covid-19 Vaccination Clinic  Name:  [name]    MRN: [id number] DOB: [birth year]  09/20/2019  [name] was observed post Covid-19 immunization for 30 minutes based on pre-vaccination screening without incidence. He was provided with Vaccine Information Sheet and instruction to access the V-Safe system.   [name] was instructed to call 911 with any severe reactions post vaccine: Marland Kitchen Difficulty breathing  . Swelling of your face and throat  . A fast heartbeat  . A bad rash all over your body  . Dizziness and weakness    Immunizations Administered    Name Date Dose VIS Date Route   Pfizer COVID-19 Vaccine 09/20/2019 12:53 PM 0.3 mL 07/27/2019 Intramuscular   Manufacturer: [place]   Lot: [id number]   [place]: [id number]

## 2019-09-25 ENCOUNTER — Other Ambulatory Visit (HOSPITAL_COMMUNITY): Payer: PPO

## 2019-09-25 DIAGNOSIS — Z0000 Encounter for general adult medical examination without abnormal findings: Secondary | ICD-10-CM | POA: Diagnosis not present

## 2019-09-25 DIAGNOSIS — Z79899 Other long term (current) drug therapy: Secondary | ICD-10-CM | POA: Diagnosis not present

## 2019-09-25 DIAGNOSIS — E7800 Pure hypercholesterolemia, unspecified: Secondary | ICD-10-CM | POA: Diagnosis not present

## 2019-09-25 DIAGNOSIS — N1831 Chronic kidney disease, stage 3a: Secondary | ICD-10-CM | POA: Diagnosis not present

## 2019-09-25 DIAGNOSIS — I129 Hypertensive chronic kidney disease with stage 1 through stage 4 chronic kidney disease, or unspecified chronic kidney disease: Secondary | ICD-10-CM | POA: Diagnosis not present

## 2019-09-25 DIAGNOSIS — Z1389 Encounter for screening for other disorder: Secondary | ICD-10-CM | POA: Diagnosis not present

## 2019-09-25 DIAGNOSIS — K219 Gastro-esophageal reflux disease without esophagitis: Secondary | ICD-10-CM | POA: Diagnosis not present

## 2019-09-25 DIAGNOSIS — B372 Candidiasis of skin and nail: Secondary | ICD-10-CM | POA: Diagnosis not present

## 2019-09-25 DIAGNOSIS — D6869 Other thrombophilia: Secondary | ICD-10-CM | POA: Diagnosis not present

## 2019-09-25 DIAGNOSIS — I509 Heart failure, unspecified: Secondary | ICD-10-CM | POA: Diagnosis not present

## 2019-09-25 DIAGNOSIS — I480 Paroxysmal atrial fibrillation: Secondary | ICD-10-CM | POA: Diagnosis not present

## 2019-09-25 DIAGNOSIS — K9089 Other intestinal malabsorption: Secondary | ICD-10-CM | POA: Diagnosis not present

## 2019-09-26 DIAGNOSIS — H40003 Preglaucoma, unspecified, bilateral: Secondary | ICD-10-CM | POA: Diagnosis not present

## 2019-09-26 DIAGNOSIS — H18519 Endothelial corneal dystrophy, unspecified eye: Secondary | ICD-10-CM | POA: Diagnosis not present

## 2019-09-26 DIAGNOSIS — Z947 Corneal transplant status: Secondary | ICD-10-CM | POA: Diagnosis not present

## 2019-09-26 DIAGNOSIS — Z961 Presence of intraocular lens: Secondary | ICD-10-CM | POA: Diagnosis not present

## 2019-09-27 ENCOUNTER — Other Ambulatory Visit: Payer: Self-pay

## 2019-09-27 ENCOUNTER — Ambulatory Visit (HOSPITAL_COMMUNITY): Payer: PPO | Attending: Cardiology

## 2019-09-27 DIAGNOSIS — I10 Essential (primary) hypertension: Secondary | ICD-10-CM | POA: Diagnosis not present

## 2019-09-27 DIAGNOSIS — E785 Hyperlipidemia, unspecified: Secondary | ICD-10-CM | POA: Diagnosis not present

## 2019-09-27 DIAGNOSIS — I080 Rheumatic disorders of both mitral and aortic valves: Secondary | ICD-10-CM | POA: Diagnosis not present

## 2019-09-27 DIAGNOSIS — I77819 Aortic ectasia, unspecified site: Secondary | ICD-10-CM

## 2019-09-27 DIAGNOSIS — I420 Dilated cardiomyopathy: Secondary | ICD-10-CM | POA: Insufficient documentation

## 2019-09-27 DIAGNOSIS — I2510 Atherosclerotic heart disease of native coronary artery without angina pectoris: Secondary | ICD-10-CM | POA: Diagnosis not present

## 2019-09-27 DIAGNOSIS — I77810 Thoracic aortic ectasia: Secondary | ICD-10-CM | POA: Insufficient documentation

## 2019-09-27 DIAGNOSIS — G4730 Sleep apnea, unspecified: Secondary | ICD-10-CM | POA: Diagnosis not present

## 2019-10-02 ENCOUNTER — Telehealth: Payer: Self-pay

## 2019-10-02 DIAGNOSIS — I712 Thoracic aortic aneurysm, without rupture, unspecified: Secondary | ICD-10-CM

## 2019-10-02 NOTE — Telephone Encounter (Signed)
.  The patient has been notified of the result and verbalized understanding.  All questions (if any) were answered. Antonieta Iba, RN 10/02/2019 5:05 PM

## 2019-10-02 NOTE — Telephone Encounter (Signed)
-----   Message from Sueanne Margarita, MD sent at 09/28/2019  7:28 PM EST ----- 2D echo showed stable mild LV dysfunction with EF 40-45% with mildly leaky MV, AV and TV.  The aortic root is moderately dilated at 74mm.  This is similar to 2019 CT at 16mm.  He has CKD with worsening Cr at 1.55.  Please order a non contrasted Chest CT to assess aortic size

## 2019-10-09 ENCOUNTER — Other Ambulatory Visit: Payer: Self-pay | Admitting: *Deleted

## 2019-10-09 DIAGNOSIS — Z01812 Encounter for preprocedural laboratory examination: Secondary | ICD-10-CM

## 2019-10-11 DIAGNOSIS — I129 Hypertensive chronic kidney disease with stage 1 through stage 4 chronic kidney disease, or unspecified chronic kidney disease: Secondary | ICD-10-CM | POA: Diagnosis not present

## 2019-10-11 DIAGNOSIS — I509 Heart failure, unspecified: Secondary | ICD-10-CM | POA: Diagnosis not present

## 2019-10-11 DIAGNOSIS — I480 Paroxysmal atrial fibrillation: Secondary | ICD-10-CM | POA: Diagnosis not present

## 2019-10-11 DIAGNOSIS — E7800 Pure hypercholesterolemia, unspecified: Secondary | ICD-10-CM | POA: Diagnosis not present

## 2019-10-16 ENCOUNTER — Ambulatory Visit: Payer: PPO | Attending: Internal Medicine

## 2019-10-16 DIAGNOSIS — Z23 Encounter for immunization: Secondary | ICD-10-CM | POA: Insufficient documentation

## 2019-10-16 NOTE — Progress Notes (Signed)
   Covid-19 Vaccination Clinic  Name:  [name]    MRN: [id number] DOB: 1934-11-27  10/16/2019  [name] was observed post Covid-19 immunization for 15 minutes without incident. He was provided with Vaccine Information Sheet and instruction to access the V-Safe system.   [name] was instructed to call 911 with any severe reactions post vaccine: Marland Kitchen Difficulty breathing  . Swelling of face and throat  . A fast heartbeat  . A bad rash all over body  . Dizziness and weakness   Immunizations Administered    Name Date Dose VIS Date Route   Pfizer COVID-19 Vaccine 10/16/2019 12:15 PM 0.3 mL 07/27/2019 Intramuscular   Manufacturer: [place]   Lot: [id number]   [place]: [id number]

## 2019-10-19 ENCOUNTER — Other Ambulatory Visit: Payer: Self-pay

## 2019-10-19 ENCOUNTER — Ambulatory Visit (INDEPENDENT_AMBULATORY_CARE_PROVIDER_SITE_OTHER)
Admission: RE | Admit: 2019-10-19 | Discharge: 2019-10-19 | Disposition: A | Discharge status: Home / Self Care | Payer: PPO | Source: Ambulatory Visit | Attending: Cardiology | Admitting: Cardiology

## 2019-10-19 DIAGNOSIS — I712 Thoracic aortic aneurysm, without rupture, unspecified: Secondary | ICD-10-CM

## 2019-10-19 MED ORDER — IOHEXOL 350 MG/ML SOLN
80.0000 mL | Freq: Once | INTRAVENOUS | Status: AC | PRN
  Administered 2019-10-19: 14:00:00 80 mL via INTRAVENOUS

## 2019-10-22 ENCOUNTER — Encounter: Payer: Self-pay | Admitting: Cardiology

## 2019-10-22 ENCOUNTER — Telehealth: Payer: Self-pay

## 2019-10-22 DIAGNOSIS — I712 Thoracic aortic aneurysm, without rupture, unspecified: Secondary | ICD-10-CM

## 2019-10-22 NOTE — Telephone Encounter (Signed)
The patient has been notified of the result and verbalized understanding.  All questions (if any) were answered. Antonieta Iba, RN 10/22/2019 3:42 PM

## 2019-10-22 NOTE — Telephone Encounter (Signed)
-----   Message from Sueanne Margarita, MD sent at 10/22/2019  8:22 AM EST ----- Stable CT with mildly dilated aorta at the sinuses of Valsalva (26mm) and ascending aorta (34mm) with aortic atherosclerosis.  There is a 11mm liver hemangioma - MR of abdomen recommended for followup in 3-6 months and stable 62mm right adrenal nodule felt likely benign - please forward this to PCP to followup on  the liver hemangioma and adrenal nodule.  Repeat Chest CTA in 1 year to followup on aortic aneurysm

## 2019-11-08 DIAGNOSIS — I480 Paroxysmal atrial fibrillation: Secondary | ICD-10-CM | POA: Diagnosis not present

## 2019-11-08 DIAGNOSIS — I509 Heart failure, unspecified: Secondary | ICD-10-CM | POA: Diagnosis not present

## 2019-11-08 DIAGNOSIS — I129 Hypertensive chronic kidney disease with stage 1 through stage 4 chronic kidney disease, or unspecified chronic kidney disease: Secondary | ICD-10-CM | POA: Diagnosis not present

## 2019-11-08 DIAGNOSIS — E7800 Pure hypercholesterolemia, unspecified: Secondary | ICD-10-CM | POA: Diagnosis not present

## 2019-11-08 DIAGNOSIS — N1831 Chronic kidney disease, stage 3a: Secondary | ICD-10-CM | POA: Diagnosis not present

## 2019-12-09 DIAGNOSIS — I480 Paroxysmal atrial fibrillation: Secondary | ICD-10-CM | POA: Diagnosis not present

## 2019-12-09 DIAGNOSIS — E7800 Pure hypercholesterolemia, unspecified: Secondary | ICD-10-CM | POA: Diagnosis not present

## 2019-12-09 DIAGNOSIS — I129 Hypertensive chronic kidney disease with stage 1 through stage 4 chronic kidney disease, or unspecified chronic kidney disease: Secondary | ICD-10-CM | POA: Diagnosis not present

## 2019-12-09 DIAGNOSIS — N1831 Chronic kidney disease, stage 3a: Secondary | ICD-10-CM | POA: Diagnosis not present

## 2019-12-09 DIAGNOSIS — I509 Heart failure, unspecified: Secondary | ICD-10-CM | POA: Diagnosis not present

## 2020-01-07 DIAGNOSIS — N1831 Chronic kidney disease, stage 3a: Secondary | ICD-10-CM | POA: Diagnosis not present

## 2020-01-07 DIAGNOSIS — I480 Paroxysmal atrial fibrillation: Secondary | ICD-10-CM | POA: Diagnosis not present

## 2020-01-07 DIAGNOSIS — I129 Hypertensive chronic kidney disease with stage 1 through stage 4 chronic kidney disease, or unspecified chronic kidney disease: Secondary | ICD-10-CM | POA: Diagnosis not present

## 2020-01-07 DIAGNOSIS — E7800 Pure hypercholesterolemia, unspecified: Secondary | ICD-10-CM | POA: Diagnosis not present

## 2020-01-07 DIAGNOSIS — I509 Heart failure, unspecified: Secondary | ICD-10-CM | POA: Diagnosis not present

## 2020-01-09 ENCOUNTER — Ambulatory Visit: Payer: PPO | Admitting: Cardiology

## 2020-02-01 DIAGNOSIS — I509 Heart failure, unspecified: Secondary | ICD-10-CM | POA: Diagnosis not present

## 2020-02-01 DIAGNOSIS — I480 Paroxysmal atrial fibrillation: Secondary | ICD-10-CM | POA: Diagnosis not present

## 2020-02-01 DIAGNOSIS — N1831 Chronic kidney disease, stage 3a: Secondary | ICD-10-CM | POA: Diagnosis not present

## 2020-02-01 DIAGNOSIS — I129 Hypertensive chronic kidney disease with stage 1 through stage 4 chronic kidney disease, or unspecified chronic kidney disease: Secondary | ICD-10-CM | POA: Diagnosis not present

## 2020-02-01 DIAGNOSIS — E7800 Pure hypercholesterolemia, unspecified: Secondary | ICD-10-CM | POA: Diagnosis not present

## 2020-02-06 ENCOUNTER — Other Ambulatory Visit: Payer: Self-pay | Admitting: Geriatric Medicine

## 2020-02-06 DIAGNOSIS — K769 Liver disease, unspecified: Secondary | ICD-10-CM

## 2020-02-21 ENCOUNTER — Other Ambulatory Visit: Payer: Self-pay | Admitting: Cardiology

## 2020-02-25 DIAGNOSIS — I480 Paroxysmal atrial fibrillation: Secondary | ICD-10-CM | POA: Diagnosis not present

## 2020-02-25 DIAGNOSIS — I509 Heart failure, unspecified: Secondary | ICD-10-CM | POA: Diagnosis not present

## 2020-02-25 DIAGNOSIS — I129 Hypertensive chronic kidney disease with stage 1 through stage 4 chronic kidney disease, or unspecified chronic kidney disease: Secondary | ICD-10-CM | POA: Diagnosis not present

## 2020-02-25 DIAGNOSIS — N1831 Chronic kidney disease, stage 3a: Secondary | ICD-10-CM | POA: Diagnosis not present

## 2020-02-25 DIAGNOSIS — E7800 Pure hypercholesterolemia, unspecified: Secondary | ICD-10-CM | POA: Diagnosis not present

## 2020-03-13 ENCOUNTER — Ambulatory Visit: Payer: PPO | Admitting: Cardiology

## 2020-03-13 ENCOUNTER — Other Ambulatory Visit: Payer: Self-pay

## 2020-03-13 ENCOUNTER — Encounter: Payer: Self-pay | Admitting: Cardiology

## 2020-03-13 VITALS — BP 124/70 | HR 72 | Ht 72.0 in | Wt 265.0 lb

## 2020-03-13 DIAGNOSIS — I5042 Chronic combined systolic (congestive) and diastolic (congestive) heart failure: Secondary | ICD-10-CM | POA: Diagnosis not present

## 2020-03-13 DIAGNOSIS — Z9861 Coronary angioplasty status: Secondary | ICD-10-CM | POA: Diagnosis not present

## 2020-03-13 DIAGNOSIS — E785 Hyperlipidemia, unspecified: Secondary | ICD-10-CM

## 2020-03-13 DIAGNOSIS — I4821 Permanent atrial fibrillation: Secondary | ICD-10-CM

## 2020-03-13 DIAGNOSIS — I2510 Atherosclerotic heart disease of native coronary artery without angina pectoris: Secondary | ICD-10-CM

## 2020-03-13 DIAGNOSIS — I77810 Thoracic aortic ectasia: Secondary | ICD-10-CM | POA: Diagnosis not present

## 2020-03-13 DIAGNOSIS — I420 Dilated cardiomyopathy: Secondary | ICD-10-CM | POA: Diagnosis not present

## 2020-03-13 DIAGNOSIS — G4733 Obstructive sleep apnea (adult) (pediatric): Secondary | ICD-10-CM

## 2020-03-13 NOTE — Patient Instructions (Addendum)
Medication Instructions:  Your physician recommends that you continue on your current medications as directed. Please refer to the Current Medication list given to you today.  *If you need a refill on your cardiac medications before your next appointment, please call your pharmacy*  Lab Work: TODAY: BMET, CBC If you have labs (blood work) drawn today and your tests are completely normal, you will receive your results only by: Marland Kitchen MyChart Message (if you have MyChart) OR . A paper copy in the mail If you have any lab test that is abnormal or we need to change your treatment, we will call you to review the results.   Testing/Procedures: Your provider recommends that you have a Chest CT scan done in February 2022.    Follow-Up: At [facility], you and your health needs are our priority.  As part of our continuing mission to provide you with exceptional heart care, we have created designated Provider Care Teams.  These Care Teams include your primary Cardiologist (physician) and Advanced Practice Providers (APPs -  Physician Assistants and Nurse Practitioners) who all work together to provide you with the care you need, when you need it.  We recommend signing up for the patient portal called "MyChart".  Sign up information is provided on this After Visit Summary.  MyChart is used to connect with patients for Virtual Visits (Telemedicine).  Patients are able to view lab/test results, encounter notes, upcoming appointments, etc.  Non-urgent messages can be sent to your provider as well.   To learn more about what you can do with MyChart, go to [email or web address].    Your next appointment:   6 month(s)  The format for your next appointment:   In Person or virtual visit   Provider:   Melina Copa, PA-C or Ermalinda Barrios, PA-C   Follow up with Dr. Radford Pax in one year.

## 2020-03-13 NOTE — Addendum Note (Signed)
Addended by: Antonieta Iba on: 03/13/2020 01:17 PM   Modules accepted: Orders

## 2020-03-13 NOTE — Progress Notes (Signed)
Cardiology Office Note:    Date:  03/13/2020   ID:  AZURE BARRALES, DOB 1935-08-04, MRN [id number]  PCP:  Lajean Manes, MD  Cardiologist:  Fransico Him, MD    Referring MD: Lajean Manes, MD   Chief Complaint  Patient presents with  . Atrial Fibrillation  . Coronary Artery Disease  . Hyperlipidemia  . Hypertension  . Sleep Apnea    History of Present Illness:    [name] is a 85 y.o. male with a hx of  PAF s/p DCCV 10/2014,dilated aortic root at (4.4 cm) and ascending aorta (4.0 cm)on most recent assessment. He has h/o systolic HF, ASCAD by cath with30% LM at the ostium, 70-80% ostial D2, 30-40% prox LCX, 30-40% prox and 40% mid RCA and is on medical management. He is on chronic anticoagulation therapy with Eliquis. He was previously on amiodarone for his afib, however this was discontinued 10/2016 due to intolerances (visual disturbances and bilateral hand numbness) and now has permanent atrial fibrillation.   2D echo 10/2017 showed a decline in EF with moderate reduced LVF with EF 35-40% and moderately dilated aortic root at 46 mm.  He underwent cath showing an occluded RPDA which filled via left to right collaterals, 85% chronic stenosis of oD2, 99% mid RCA, 30% proximal to mid LAD, 30% proximal to distal left circumflex and moderate LV dysfunction with EF 25-35%. He underwent PCI with drug-eluting stent to the mid RCA. He also was noted to have moderate pulmonary hypertension with an LVEDP of 29 and a pulmonary capillary wedge closure to 40. It was felt that he likely had a combination of group 2 and group 3 pulmonary hypertension secondary to obstructive sleep apnea as well as congestive heart failure with pulmonary venous hypertension.  Repeat echo 09/2018 showed an improvement in LVF with E 40-45% with mild to moderate diffuse LV dysfunction and 80m aortic root at sinuses of Valsalva.  Chest CTA the year before showed 460mSinus of Valsalva.    He is here today for  followup and is doing well.  He denies any chest pain or pressure, PND, orthopnea, dizziness, palpitations or syncope. Occasionally he will have some mild LE edema.  He has some mild DOE at times related to obesity.  He is compliant with his meds and is tolerating meds with no SE.    He is doing well with his CPAP device and thinks that he has gotten used to it.  He tolerates the mask and feels the pressure is adequate.  Since going on CPAP he feels rested in the am and has no significant daytime sleepiness.  He denies any significant mouth or nasal dryness or nasal congestion.  He does not think that he snores.     Past Medical History:  Diagnosis Date  . Aortic aneurysm (HCC)    4658mt sinuses of Valsalva and 25m75mcending aorta by chest CTA 10/2019  . Benign essential HTN 09/17/2014  . CAD (coronary artery disease), native coronary artery 09/20/2014   30% LM at the ostium, 70-80% ostial D2, 30-40% prox LCX, 30-40% prox and 40% mid RCA and EF 40%. 11/07/17 DES to RCA   . Chronic lower back pain   . DCM (dilated cardiomyopathy) (HCC)[place]/2016   a.  EF 40%; b. Echo 5/16:  EF 40-45%, diff HK, inf AK, mild AI, MAC, severe LAE  . Dilated aortic root (HCC)[place]/09/2014   41mm77mSInus of Valsalva on echo 09/2018  . DJD (degenerative  joint disease)    feet, knees and back  . GERD (gastroesophageal reflux disease)    hiatal hernia  . Hepatitis 1953   "A or B; not sure which"  . High cholesterol   . History of blood transfusion 1954   "related to hip OR"  . History of hip surgery 1954   "knocked hips out of joints; had to have bone grafts; both sides"  . Hyperlipidemia LDL goal <70 10/14/2015  . Lesion of left femoral nerve    3 cm MRI 04/2010 consistent with hemangioma  . Mild aortic insufficiency   . Obesity   . OSA (obstructive sleep apnea) 10/14/2015   Mild with AHI 10.6/hr overall and 38/hr during REM sleep.  On CPAP at 9cm H2O  . OSA on CPAP   . Osteoarthritis    "all over" (11/07/2017)    . PAF (paroxysmal atrial fibrillation) ([place]) 09/17/2014  . PSA (psoriatic arthritis) (HCC)    Mild increase 3.65 on 05/2011 stable 3.68 on 09/2011  . Renal cyst, left 2010   5 cm seen on ultrasound     Past Surgical History:  Procedure Laterality Date  . APPENDECTOMY    . BACK SURGERY    . CARDIOVERSION N/A 11/04/2014   Procedure: CARDIOVERSION;  Surgeon: Sueanne Margarita, MD;  Location: [facility] ENDOSCOPY;  Service: Cardiovascular;  Laterality: N/A;  . CARDIOVERSION N/A 01/09/2015   Procedure: CARDIOVERSION;  Surgeon: Pixie Casino, MD;  Location: [facility] ENDOSCOPY;  Service: Cardiovascular;  Laterality: N/A;  . CATARACT EXTRACTION W/ INTRAOCULAR LENS  IMPLANT, BILATERAL Bilateral   . CORONARY STENT INTERVENTION N/A 11/07/2017   Procedure: CORONARY STENT INTERVENTION;  Surgeon: Leonie Man, MD;  Location: [place] CV LAB;  Service: Cardiovascular;  Laterality: N/A;  . HERNIA REPAIR    . JOINT REPLACEMENT    . LAPAROSCOPIC CHOLECYSTECTOMY    . LEFT HEART CATHETERIZATION WITH CORONARY ANGIOGRAM N/A 10/01/2014   Procedure: LEFT HEART CATHETERIZATION WITH CORONARY ANGIOGRAM;  Surgeon: Troy Sine, MD;  Location: [facility] CATH LAB;  Service: Cardiovascular;  Laterality: N/A;  . LUMBAR LAMINECTOMY    . REVISION TOTAL HIP ARTHROPLASTY Right   . RIGHT/LEFT HEART CATH AND CORONARY ANGIOGRAPHY N/A 11/07/2017   Procedure: RIGHT/LEFT HEART CATH AND CORONARY ANGIOGRAPHY;  Surgeon: Leonie Man, MD;  Location: [place] CV LAB;  Service: Cardiovascular;  Laterality: N/A;  . SHOULDER OPEN ROTATOR CUFF REPAIR Bilateral   . TONSILLECTOMY    . TOTAL HIP ARTHROPLASTY Bilateral   . TOTAL KNEE ARTHROPLASTY Bilateral   . UMBILICAL HERNIA REPAIR      Current Medications: Current Meds  Medication Sig  . acetaminophen (TYLENOL) 650 MG CR tablet Take 650 mg by mouth every 8 (eight) hours as needed for pain.  Marland Kitchen apixaban (ELIQUIS) 5 MG TABS tablet Take 1 tablet (5 mg total) by mouth 2 (two) times daily.  .  clopidogrel (PLAVIX) 75 MG tablet TAKE ONE TABLET BY MOUTH EVERY MORNING  . ENTRESTO 49-51 MG TAKE 1 TABLET BY MOUTH TWICE DAILY  . ezetimibe (ZETIA) 10 MG tablet Take 1 tablet (10 mg total) by mouth daily.  . metoprolol succinate (TOPROL-XL) 25 MG 24 hr tablet Take 0.5 tablets (12.5 mg total) by mouth daily. Take with or immediately following a meal.  . MULTIPLE VITAMINS PO Take 1 tablet by mouth daily.  . pantoprazole (PROTONIX) 40 MG tablet Take 1 tablet (40 mg total) by mouth daily.  . rosuvastatin (CRESTOR) 40 MG tablet TAKE ONE TABLET BY MOUTH  DAILY  . spironolactone (ALDACTONE) 25 MG tablet Take 0.5 tablets (12.5 mg total) by mouth daily.  Marland Kitchen torsemide (DEMADEX) 20 MG tablet TAKE ONE TABLET BY MOUTH EVERY MORNING  . traMADol (ULTRAM) 50 MG tablet Take 50 mg by mouth every 6 (six) hours as needed for moderate pain.     Allergies:   Simvastatin, Codeine phosphate, and Penicillins   Social History   Socioeconomic History  . Marital status: Married    Spouse name: Not on file  . Number of children: 4  . Years of education: Not on file  . Highest education level: Not on file  Occupational History  . Occupation: Naval architect  Tobacco Use  . Smoking status: Former Smoker    Packs/day: 1.00    Years: 30.00    Pack years: 30.00    Types: Cigarettes  . Smokeless tobacco: Never Used  . Tobacco comment: "quit smoking in the 1980s"  Vaping Use  . Vaping Use: Never used  Substance and Sexual Activity  . Alcohol use: No  . Drug use: No  . Sexual activity: Yes  Other Topics Concern  . Not on file  Social History Narrative  . Not on file   Social Determinants of Health   Financial Resource Strain:   . Difficulty of Paying Living Expenses:   Food Insecurity:   . Worried About Charity fundraiser in the Last Year:   . Arboriculturist in the Last Year:   Transportation Needs:   . Film/video editor (Medical):   Marland Kitchen Lack of Transportation (Non-Medical):   Physical  Activity:   . Days of Exercise per Week:   . Minutes of Exercise per Session:   Stress:   . Feeling of Stress :   Social Connections:   . Frequency of Communication with Friends and Family:   . Frequency of Social Gatherings with Friends and Family:   . Attends Religious Services:   . Active Member of Clubs or Organizations:   . Attends Archivist Meetings:   Marland Kitchen Marital Status:      Family History: The patient's family history includes Atrial fibrillation in his sister; Cancer in his mother; Diabetes in his father; Heart attack in his maternal grandmother, paternal grandfather, and paternal grandmother; Heart disease in his sister; Stroke in his maternal grandfather.  ROS:   Please see the history of present illness.    ROS  All other systems reviewed and negative.   EKGs/Labs/Other Studies Reviewed:    The following studies were reviewed today: none  EKG:  EKG is  ordered today.  The ekg ordered today demonstrates atrial fibrillation with CVR and PVCs   Recent Labs: No results found for requested labs within last 8760 hours.   Recent Lipid Panel    Component Value Date/Time   CHOL 149 10/20/2017 1220   TRIG 96 10/20/2017 1220   HDL 49 10/20/2017 1220   CHOLHDL 3.0 10/20/2017 1220   CHOLHDL 2.5 05/10/2016 1145   VLDL 19 05/10/2016 1145   LDLCALC 81 10/20/2017 1220    Physical Exam:    VS:  BP 124/70 (BP Location: Right Arm, Patient Position: Sitting, Cuff Size: Normal)   Pulse 72   Ht 6' (1.829 m)   Wt (!) 265 lb (120.2 kg)   SpO2 95%   BMI 35.94 kg/m     Wt Readings from Last 3 Encounters:  03/13/20 (!) 265 lb (120.2 kg)  07/04/19 265 lb 9.6 oz (120.5 kg)  08/14/18 255 lb 4.8 oz (115.8 kg)     GEN:  Well nourished, well developed in no acute distress HEENT: Normal NECK: No JVD; No carotid bruits LYMPHATICS: No lymphadenopathy CARDIAC: RRR, no murmurs, rubs, gallops RESPIRATORY:  Clear to auscultation without rales, wheezing or rhonchi    ABDOMEN: Soft, non-tender, non-distended MUSCULOSKELETAL:  No edema; No deformity  SKIN: Warm and dry NEUROLOGIC:  Alert and oriented x 3 PSYCHIATRIC:  Normal affect   ASSESSMENT:    1. Permanent atrial fibrillation ([place])   2. DCM (dilated cardiomyopathy) ([place])   3. CAD S/P percutaneous coronary angioplasty   4. Chronic combined systolic and diastolic CHF (congestive heart failure) ([place])   5. Dilated aortic root ([place])   6. OSA (obstructive sleep apnea)   7. Hyperlipidemia LDL goal <70    PLAN:    In order of problems listed above:  1.Permanent atrial fibrillation -his HR is well controlled on exam -he has not had any palpitations -denies any bleeding problems on DOAC -continue apixaban to 71m BID but repeat BMET and if Creatinine > 1.5 then change to 2.526mBID -I will repeat BMET and if Creatinine remains > 1.5 then decrease Eliquis to 2.48m49mID.  2. DCM -echo 2019 showed decline in EF to 35-40% prompting cath -Repeat heart cath showed an ostial D2 85% lesion, occluded RPDA, 99% mid RCA and he underwent PCI DES to the mid RCA in March 2019.   -repeat 2D echo 09/2018 showed improvement in LVF with EF 40-45% -Continue Entresto 49-18m39mD, spironolactone 12.48mg 20mly, Torsemide 20mg 27my and Toprol XL 50mg d13m.  -Check BMET  3. ASCAD -Cath showed an occluded RPDA which filled via left to right collaterals, 85% chronic stenosis of ostial D2, 99% mid RCA, 30% proximal to mid LAD, 30% proximal to distal left circumflex and moderate LV dysfunction with EF 25-35% echo showed EF 35 to 40%. -s/p PCI with drug-eluting stent to the mid RCA.   -he had not had any anginal symptoms -no ASA due to DOAC -continue Plavix 748mg da86m BB and statin  4.Chronic combined systolic/diastolic CHF -he appears euvolemic on exam today -Echo 10/20/2017 showed EF 35 to 40% and improvement to 40-45% on echo 09/2018.  -continue Torsemide 20mg dai39mbeta-blocker, Entresto and spironolactone  12.5 mg daily.    5.Dilated aortic root -dilated at sinuses of Valsalvaat 46mm by C22m CTA 10/2019 -repeat chest CTA in 09/2020 to reassess -Continue statin -BP controlled  5. HTN  -blood pressure is well controlled on exam today.  -Continue on Toprol-XL 50 mg daily, Entresto and spiro  6.OSA - The patient is tolerating PAP therapy well without any problems. The PAP download was reviewed today and showed an AHI of 7.7/hr on 9 cm H2O with 97% compliance in using more than 4 hours nightly.  The patient has been using and benefiting from PAP use and will continue to benefit from therapy.  -increase CPAP to 10cm H2O and get a download in 4 weeks  6. Hyperlpidemia with LDL goal < 70. -He will continue on Crestor 40 mg daily Zetia 10 mg daily.    -His LDL was 54 in Feb 2021    Medication Adjustments/Labs and Tests Ordered: Current medicines are reviewed at length with the patient today.  Concerns regarding medicines are outlined above.  No orders of the defined types were placed in this encounter.  No orders of the defined types were placed in this encounter.   Signed, Maleah Rabago TurnFransico Him/2021 1:02  PM    [facility]

## 2020-03-14 ENCOUNTER — Telehealth: Payer: Self-pay | Admitting: *Deleted

## 2020-03-14 DIAGNOSIS — G4733 Obstructive sleep apnea (adult) (pediatric): Secondary | ICD-10-CM

## 2020-03-14 LAB — BASIC METABOLIC PANEL
BUN/Creatinine Ratio: 19 (ref 10–24)
BUN: 30 mg/dL — ABNORMAL HIGH (ref 8–27)
CO2: 24 mmol/L (ref 20–29)
Calcium: 9.2 mg/dL (ref 8.6–10.2)
Chloride: 100 mmol/L (ref 96–106)
Creatinine, Ser: 1.61 mg/dL — ABNORMAL HIGH (ref 0.76–1.27)
GFR calc Af Amer: 45 mL/min/{1.73_m2} — ABNORMAL LOW (ref >59)
GFR calc non Af Amer: 39 mL/min/{1.73_m2} — ABNORMAL LOW (ref >59)
Glucose: 91 mg/dL (ref 65–99)
Potassium: 5 mmol/L (ref 3.5–5.2)
Sodium: 137 mmol/L (ref 134–144)

## 2020-03-14 LAB — CBC
Hematocrit: 46.1 % (ref 37.5–51.0)
Hemoglobin: 15 g/dL (ref 13.0–17.7)
MCH: 27.4 pg (ref 26.6–33.0)
MCHC: 32.5 g/dL (ref 31.5–35.7)
MCV: 84 fL (ref 79–97)
Platelets: 219 10*3/uL (ref 150–450)
RBC: 5.48 x10E6/uL (ref 4.14–5.80)
RDW: 13.8 % (ref 11.6–15.4)
WBC: 8.9 10*3/uL (ref 3.4–10.8)

## 2020-03-14 NOTE — Telephone Encounter (Signed)
Order placed to [employer] [facility] via community message. 

## 2020-03-14 NOTE — Telephone Encounter (Signed)
-----   Message from Antonieta Iba, RN sent at 03/13/2020  1:26 PM EDT ----- Per Dr. Radford Pax: increase CPAP to 10 cm H2O and get download in 4 weeks.

## 2020-03-17 ENCOUNTER — Telehealth: Payer: Self-pay

## 2020-03-17 ENCOUNTER — Inpatient Hospital Stay: Admission: RE | Admit: 2020-03-17 | Payer: PPO | Source: Ambulatory Visit

## 2020-03-17 DIAGNOSIS — I10 Essential (primary) hypertension: Secondary | ICD-10-CM

## 2020-03-17 NOTE — Telephone Encounter (Signed)
-----   Message from Sueanne Margarita, MD sent at 03/14/2020  6:38 PM EDT ----- Creatinine too high>>stop demadex and repeat BMET in 1 week.  Please have patient call if he develops any LE edema, SOB or weight gain>>weight daily and call if he gains > 3lbs in 1 day and 5lbs in 1 week

## 2020-03-21 ENCOUNTER — Telehealth: Payer: Self-pay | Admitting: Cardiology

## 2020-03-21 NOTE — Telephone Encounter (Signed)
Pt c/o swelling: STAT is pt has developed SOB within 24 hours  1) How much weight have you gained and in what time span? 5 lbs in 1 week  2) If swelling, where is the swelling located? Foot swelling  3) Are you currently taking a fluid pill? Yes   4) Are you currently SOB? No  5) Do you have a log of your daily weights (if so, list)? 260 lbs - no additional readings available  6) Have you gained 3 pounds in a day or 5 pounds in a week? Patient states he gained 5 lbs in about a week  7) Have you traveled recently? No

## 2020-03-21 NOTE — Telephone Encounter (Signed)
Spoke with the patient who states that he has gained about 5 pounds in a week. He was around 255 and is up to 260 now. He is having some swelling in his feet.  Torsemide was stopped 08/02. He is having repeat blood work on 08/09 to recheck kidney function. Continues spironolactone 12.5 daily. He states that he is not having any increased SOB. Denies chest pain. Blood pressure this morning was 106/50s.

## 2020-03-21 NOTE — Telephone Encounter (Signed)
Please have him use compression hose for LE edema.  Will await lab work on Monday before restarting diuretics

## 2020-03-24 ENCOUNTER — Other Ambulatory Visit: Payer: PPO | Admitting: *Deleted

## 2020-03-24 ENCOUNTER — Other Ambulatory Visit: Payer: Self-pay

## 2020-03-24 DIAGNOSIS — I10 Essential (primary) hypertension: Secondary | ICD-10-CM | POA: Diagnosis not present

## 2020-03-25 LAB — BASIC METABOLIC PANEL
BUN/Creatinine Ratio: 17 (ref 10–24)
BUN: 23 mg/dL (ref 8–27)
CO2: 23 mmol/L (ref 20–29)
Calcium: 9.5 mg/dL (ref 8.6–10.2)
Chloride: 105 mmol/L (ref 96–106)
Creatinine, Ser: 1.36 mg/dL — ABNORMAL HIGH (ref 0.76–1.27)
GFR calc Af Amer: 56 mL/min/{1.73_m2} — ABNORMAL LOW (ref >59)
GFR calc non Af Amer: 48 mL/min/{1.73_m2} — ABNORMAL LOW (ref >59)
Glucose: 114 mg/dL — ABNORMAL HIGH (ref 65–99)
Potassium: 4.9 mmol/L (ref 3.5–5.2)
Sodium: 141 mmol/L (ref 134–144)

## 2020-03-26 DIAGNOSIS — I129 Hypertensive chronic kidney disease with stage 1 through stage 4 chronic kidney disease, or unspecified chronic kidney disease: Secondary | ICD-10-CM | POA: Diagnosis not present

## 2020-03-26 DIAGNOSIS — I509 Heart failure, unspecified: Secondary | ICD-10-CM | POA: Diagnosis not present

## 2020-03-26 DIAGNOSIS — I480 Paroxysmal atrial fibrillation: Secondary | ICD-10-CM | POA: Diagnosis not present

## 2020-03-26 DIAGNOSIS — N1831 Chronic kidney disease, stage 3a: Secondary | ICD-10-CM | POA: Diagnosis not present

## 2020-03-26 NOTE — Telephone Encounter (Signed)
Sueanne Margarita, MD  Antonieta Iba, RN Cc: Lajean Manes, MD Stable labs - continue current meds and forward to PCP  The patient has been notified of the result and verbalized understanding.  All questions (if any) were answered. Antonieta Iba, RN 03/26/2020 11:06 AM   Patient states that he is still having swelling. Advised him to use compression hose. He states that he has lost a couple pounds but is not back at his baseline. Still having SOB but no more than usual.

## 2020-03-26 NOTE — Telephone Encounter (Signed)
Please have patient take Torsemide 20mg  PRN for weight > 3lbs in a day or 5lbs in a week and let us know if weight normalizes

## 2020-03-27 MED ORDER — TORSEMIDE 20 MG PO TABS
ORAL_TABLET | ORAL | 3 refills | Status: DC
Start: 2020-03-27 — End: 2021-04-06

## 2020-03-27 NOTE — Telephone Encounter (Signed)
Spoke with the patient and advised him that he can take torsemide 20 mg as needed for weight gain per Dr. Theodosia Blender instructions. Patient verbalized understanding and will let us know if weight gets back to baseline.

## 2020-04-02 DIAGNOSIS — H18519 Endothelial corneal dystrophy, unspecified eye: Secondary | ICD-10-CM | POA: Diagnosis not present

## 2020-04-02 DIAGNOSIS — Z947 Corneal transplant status: Secondary | ICD-10-CM | POA: Diagnosis not present

## 2020-04-02 DIAGNOSIS — Z961 Presence of intraocular lens: Secondary | ICD-10-CM | POA: Diagnosis not present

## 2020-04-12 ENCOUNTER — Other Ambulatory Visit: Payer: Self-pay

## 2020-04-12 ENCOUNTER — Ambulatory Visit
Admission: RE | Admit: 2020-04-12 | Discharge: 2020-04-12 | Disposition: A | Discharge status: Home / Self Care | Payer: PPO | Source: Ambulatory Visit | Attending: Geriatric Medicine | Admitting: Geriatric Medicine

## 2020-04-12 DIAGNOSIS — Q631 Lobulated, fused and horseshoe kidney: Secondary | ICD-10-CM | POA: Diagnosis not present

## 2020-04-12 DIAGNOSIS — K759 Inflammatory liver disease, unspecified: Secondary | ICD-10-CM | POA: Diagnosis not present

## 2020-04-12 DIAGNOSIS — D3501 Benign neoplasm of right adrenal gland: Secondary | ICD-10-CM | POA: Diagnosis not present

## 2020-04-12 DIAGNOSIS — I700 Atherosclerosis of aorta: Secondary | ICD-10-CM | POA: Diagnosis not present

## 2020-04-12 DIAGNOSIS — K769 Liver disease, unspecified: Secondary | ICD-10-CM

## 2020-04-12 MED ORDER — GADOBENATE DIMEGLUMINE 529 MG/ML IV SOLN
20.0000 mL | Freq: Once | INTRAVENOUS | Status: AC | PRN
  Administered 2020-04-12: 20 mL via INTRAVENOUS

## 2020-04-14 DIAGNOSIS — I480 Paroxysmal atrial fibrillation: Secondary | ICD-10-CM | POA: Diagnosis not present

## 2020-04-14 DIAGNOSIS — I129 Hypertensive chronic kidney disease with stage 1 through stage 4 chronic kidney disease, or unspecified chronic kidney disease: Secondary | ICD-10-CM | POA: Diagnosis not present

## 2020-04-14 DIAGNOSIS — N1831 Chronic kidney disease, stage 3a: Secondary | ICD-10-CM | POA: Diagnosis not present

## 2020-04-14 DIAGNOSIS — E7800 Pure hypercholesterolemia, unspecified: Secondary | ICD-10-CM | POA: Diagnosis not present

## 2020-04-14 DIAGNOSIS — I509 Heart failure, unspecified: Secondary | ICD-10-CM | POA: Diagnosis not present

## 2020-05-09 DIAGNOSIS — I509 Heart failure, unspecified: Secondary | ICD-10-CM | POA: Diagnosis not present

## 2020-05-09 DIAGNOSIS — N1831 Chronic kidney disease, stage 3a: Secondary | ICD-10-CM | POA: Diagnosis not present

## 2020-05-09 DIAGNOSIS — I129 Hypertensive chronic kidney disease with stage 1 through stage 4 chronic kidney disease, or unspecified chronic kidney disease: Secondary | ICD-10-CM | POA: Diagnosis not present

## 2020-05-09 DIAGNOSIS — I480 Paroxysmal atrial fibrillation: Secondary | ICD-10-CM | POA: Diagnosis not present

## 2020-05-09 DIAGNOSIS — E7800 Pure hypercholesterolemia, unspecified: Secondary | ICD-10-CM | POA: Diagnosis not present

## 2020-06-05 DIAGNOSIS — I129 Hypertensive chronic kidney disease with stage 1 through stage 4 chronic kidney disease, or unspecified chronic kidney disease: Secondary | ICD-10-CM | POA: Diagnosis not present

## 2020-06-05 DIAGNOSIS — I480 Paroxysmal atrial fibrillation: Secondary | ICD-10-CM | POA: Diagnosis not present

## 2020-06-05 DIAGNOSIS — N1831 Chronic kidney disease, stage 3a: Secondary | ICD-10-CM | POA: Diagnosis not present

## 2020-06-05 DIAGNOSIS — I509 Heart failure, unspecified: Secondary | ICD-10-CM | POA: Diagnosis not present

## 2020-06-05 DIAGNOSIS — E7800 Pure hypercholesterolemia, unspecified: Secondary | ICD-10-CM | POA: Diagnosis not present

## 2020-06-24 DIAGNOSIS — I509 Heart failure, unspecified: Secondary | ICD-10-CM | POA: Diagnosis not present

## 2020-06-24 DIAGNOSIS — N1831 Chronic kidney disease, stage 3a: Secondary | ICD-10-CM | POA: Diagnosis not present

## 2020-06-24 DIAGNOSIS — I480 Paroxysmal atrial fibrillation: Secondary | ICD-10-CM | POA: Diagnosis not present

## 2020-06-24 DIAGNOSIS — Z23 Encounter for immunization: Secondary | ICD-10-CM | POA: Diagnosis not present

## 2020-06-24 DIAGNOSIS — I129 Hypertensive chronic kidney disease with stage 1 through stage 4 chronic kidney disease, or unspecified chronic kidney disease: Secondary | ICD-10-CM | POA: Diagnosis not present

## 2020-06-24 NOTE — Patient Outreach (Signed)
[facility]) Care Management  06/24/2020  [name] 15-Sep-1934 [id number]   Received referral from Dr. Felipa Eth (scanned in media section), patient [employer] insurance and is attributed to [facility]. Per their protocol send referral to [email or web address], ordering provider was notified.   [name] under supervision of [place] Management Assistant [phone]

## 2020-07-07 DIAGNOSIS — E7800 Pure hypercholesterolemia, unspecified: Secondary | ICD-10-CM | POA: Diagnosis not present

## 2020-07-07 DIAGNOSIS — K219 Gastro-esophageal reflux disease without esophagitis: Secondary | ICD-10-CM | POA: Diagnosis not present

## 2020-07-07 DIAGNOSIS — N1831 Chronic kidney disease, stage 3a: Secondary | ICD-10-CM | POA: Diagnosis not present

## 2020-07-07 DIAGNOSIS — I129 Hypertensive chronic kidney disease with stage 1 through stage 4 chronic kidney disease, or unspecified chronic kidney disease: Secondary | ICD-10-CM | POA: Diagnosis not present

## 2020-07-07 DIAGNOSIS — I480 Paroxysmal atrial fibrillation: Secondary | ICD-10-CM | POA: Diagnosis not present

## 2020-07-07 DIAGNOSIS — I509 Heart failure, unspecified: Secondary | ICD-10-CM | POA: Diagnosis not present

## 2020-07-28 ENCOUNTER — Other Ambulatory Visit: Payer: Self-pay | Admitting: Cardiology

## 2020-07-31 ENCOUNTER — Other Ambulatory Visit: Payer: Self-pay

## 2020-07-31 DIAGNOSIS — K219 Gastro-esophageal reflux disease without esophagitis: Secondary | ICD-10-CM | POA: Diagnosis not present

## 2020-07-31 DIAGNOSIS — I480 Paroxysmal atrial fibrillation: Secondary | ICD-10-CM | POA: Diagnosis not present

## 2020-07-31 DIAGNOSIS — I509 Heart failure, unspecified: Secondary | ICD-10-CM | POA: Diagnosis not present

## 2020-07-31 DIAGNOSIS — N1831 Chronic kidney disease, stage 3a: Secondary | ICD-10-CM | POA: Diagnosis not present

## 2020-07-31 DIAGNOSIS — I129 Hypertensive chronic kidney disease with stage 1 through stage 4 chronic kidney disease, or unspecified chronic kidney disease: Secondary | ICD-10-CM | POA: Diagnosis not present

## 2020-07-31 DIAGNOSIS — E7800 Pure hypercholesterolemia, unspecified: Secondary | ICD-10-CM | POA: Diagnosis not present

## 2020-07-31 MED ORDER — METOPROLOL SUCCINATE ER 25 MG PO TB24: 12.5000 mg | ORAL_TABLET | Freq: Every day | ORAL | 2 refills | Status: DC

## 2020-07-31 NOTE — Telephone Encounter (Signed)
Pt's medication was sent to pt's pharmacy as requested. Confirmation received.  °

## 2020-08-07 DIAGNOSIS — Z03818 Encounter for observation for suspected exposure to other biological agents ruled out: Secondary | ICD-10-CM | POA: Diagnosis not present

## 2020-08-07 DIAGNOSIS — Z20822 Contact with and (suspected) exposure to covid-19: Secondary | ICD-10-CM | POA: Diagnosis not present

## 2020-09-04 DIAGNOSIS — E7800 Pure hypercholesterolemia, unspecified: Secondary | ICD-10-CM | POA: Diagnosis not present

## 2020-09-04 DIAGNOSIS — I480 Paroxysmal atrial fibrillation: Secondary | ICD-10-CM | POA: Diagnosis not present

## 2020-09-04 DIAGNOSIS — I509 Heart failure, unspecified: Secondary | ICD-10-CM | POA: Diagnosis not present

## 2020-09-04 DIAGNOSIS — K219 Gastro-esophageal reflux disease without esophagitis: Secondary | ICD-10-CM | POA: Diagnosis not present

## 2020-09-04 DIAGNOSIS — I129 Hypertensive chronic kidney disease with stage 1 through stage 4 chronic kidney disease, or unspecified chronic kidney disease: Secondary | ICD-10-CM | POA: Diagnosis not present

## 2020-09-04 DIAGNOSIS — N1831 Chronic kidney disease, stage 3a: Secondary | ICD-10-CM | POA: Diagnosis not present

## 2020-09-11 DIAGNOSIS — H00015 Hordeolum externum left lower eyelid: Secondary | ICD-10-CM | POA: Diagnosis not present

## 2020-09-15 ENCOUNTER — Other Ambulatory Visit: Payer: Self-pay | Admitting: Dermatology

## 2020-09-19 DIAGNOSIS — H00015 Hordeolum externum left lower eyelid: Secondary | ICD-10-CM | POA: Diagnosis not present

## 2020-09-22 NOTE — Progress Notes (Signed)
Cardiology Office Note    Date:  09/24/2020   ID:  ALEKSI BRUMMET, DOB March 06, 1935, MRN [id number]  PCP:  Lajean Manes, MD  Cardiologist: Fransico Him, MD EPS: None  No chief complaint on file.   History of Present Illness:  MURL GOLLADAY is a 86 y.o. male with a hx of permanent Afib s/p DCCV 10/2014 on Eliquis. He was previously on amiodarone for his afib, however this was discontinued 10/2016 due to intolerances (visual disturbances and bilateral hand numbness) and now has permanent atrial fibrillation.  dilated aortic root at (4.4 cm) and ascending aorta (4.0 cm) on most recent assessment.   2D echo 10/2017 showed a decline in EF with moderate reduced LVF with EF 35-40% and moderately dilated aortic root at 46 mm.  He underwent cath showing an occluded RPDA which filled via left to right collaterals, 85% chronic stenosis of oD2, 99% mid RCA, 30% proximal to mid LAD, 30% proximal to distal left circumflex and moderate LV dysfunction with EF 25-35%.  He underwent PCI with drug-eluting stent to the mid RCA.  He also was noted to have moderate pulmonary hypertension with an LVEDP of 29 and a pulmonary capillary wedge closure to 40.  It was felt that he likely had a combination of group 2 and group 3 pulmonary hypertension secondary to obstructive sleep apnea as well as congestive heart failure with pulmonary venous hypertension.   Repeat echo 09/2019 showed an improvement in LVEF 40-45% with mild to moderate diffuse LV dysfunction and 33m aortic root at sinuses of Valsalva.  Chest CTA 10/2019  stable 46 point 6 aortic diameter at the sinuses of Valsalva.  Patient comes in for f/u. Chronic DOE from weight. Has arthritis so can't exercise. Denies chest pain, dizzinesss. Takes occasional lasix for mild edema but rarely takes. Counting calories to lose weight.    Past Medical History:  Diagnosis Date  . Aortic aneurysm (HCC)    453mat sinuses of Valsalva and 4283mscending aorta by chest CTA  10/2019  . Benign essential HTN 09/17/2014  . CAD (coronary artery disease), native coronary artery 09/20/2014   30% LM at the ostium, 70-80% ostial D2, 30-40% prox LCX, 30-40% prox and 40% mid RCA and EF 40%. 11/07/17 DES to RCA   . Chronic lower back pain   . DCM [place]dilated [place]) ([place]/09/2014   a.  EF 40%; b. Echo 5/16:  EF 40-45%, diff HK, inf AK, mild AI, MAC, severe LAE  . Dilated aortic root ([place]/09/2014   33m71m SInus of Valsalva on echo 09/2018  . DJD (degenerative joint disease)    feet, knees and back  . GERD (gastroesophageal reflux disease)    hiatal hernia  . Hepatitis 1953   "A or B; not sure which"  . High cholesterol   . History of blood transfusion 1954   "related to hip OR"  . History of hip surgery 1954   "knocked hips out of joints; had to have bone grafts; both sides"  . Hyperlipidemia LDL goal <70 10/14/2015  . Lesion of left femoral nerve    3 cm MRI 04/2010 consistent with hemangioma  . Mild aortic insufficiency   . Obesity   . OSA (obstructive sleep apnea) 10/14/2015   Mild with AHI 10.6/hr overall and 38/hr during REM sleep.  On CPAP at 9cm H2O  . OSA on CPAP   . Osteoarthritis    "all over" (11/07/2017)  . PAF (paroxysmal atrial fibrillation) (HCC)[place]  09/17/2014  . PSA (psoriatic arthritis) (HCC)    Mild increase 3.65 on 05/2011 stable 3.68 on 09/2011  . Renal cyst, left 2010   5 cm seen on ultrasound     Past Surgical History:  Procedure Laterality Date  . APPENDECTOMY    . BACK SURGERY    . CARDIOVERSION N/A 11/04/2014   Procedure: CARDIOVERSION;  Surgeon: Sueanne Margarita, MD;  Location[facility] ENDOSCOPY;  Service: Cardiovascular;  Laterality: N/A;  . CARDIOVERSION N/A 01/09/2015   Procedure: CARDIOVERSION;  Surgeon: Pixie Casino, MD;  Location: [facility] ENDOSCOPY;  Service: Cardiovascular;  Laterality: N/A;  . CATARACT EXTRACTION W/ INTRAOCULAR LENS  IMPLANT, BILATERAL Bilateral   . CORONARY STENT INTERVENTION N/A 11/07/2017   Procedure: CORONARY  STENT INTERVENTION;  Surgeon: Leonie Man, MD;  Location: [facility] CV LAB;  Service: Cardiovascular;  Laterality: N/A;  . HERNIA REPAIR    . JOINT REPLACEMENT    . LAPAROSCOPIC CHOLECYSTECTOMY    . LEFT HEART CATHETERIZATION WITH CORONARY ANGIOGRAM N/A 10/01/2014   Procedure: LEFT HEART CATHETERIZATION WITH CORONARY ANGIOGRAM;  Surgeon: Troy Sine, MD;  Location: [facility] CATH LAB;  Service: Cardiovascular;  Laterality: N/A;  . LUMBAR LAMINECTOMY    . REVISION TOTAL HIP ARTHROPLASTY Right   . RIGHT/LEFT HEART CATH AND CORONARY ANGIOGRAPHY N/A 11/07/2017   Procedure: RIGHT/LEFT HEART CATH AND CORONARY ANGIOGRAPHY;  Surgeon: Leonie Man, MD;  Location: [facility] CV LAB;  Service: Cardiovascular;  Laterality: N/A;  . SHOULDER OPEN ROTATOR CUFF REPAIR Bilateral   . TONSILLECTOMY    . TOTAL HIP ARTHROPLASTY Bilateral   . TOTAL KNEE ARTHROPLASTY Bilateral   . UMBILICAL HERNIA REPAIR      Current Medications: Current Meds  Medication Sig  . acetaminophen (TYLENOL) 650 MG CR tablet Take 650 mg by mouth every 8 (eight) hours as needed for pain.  Marland Kitchen apixaban (ELIQUIS) 5 MG TABS tablet Take 1 tablet (5 mg total) by mouth 2 (two) times daily.  . clopidogrel (PLAVIX) 75 MG tablet TAKE ONE TABLET BY MOUTH EVERY MORNING  . ENTRESTO 49-51 MG TAKE 1 TABLET BY MOUTH TWICE DAILY  . ezetimibe (ZETIA) 10 MG tablet Take 1 tablet (10 mg total) by mouth daily.  . metoprolol succinate (TOPROL-XL) 25 MG 24 hr tablet Take 0.5 tablets (12.5 mg total) by mouth daily. Take with or immediately following a meal.  . MULTIPLE VITAMINS PO Take 1 tablet by mouth daily.  . pantoprazole (PROTONIX) 40 MG tablet Take 1 tablet (40 mg total) by mouth daily.  . rosuvastatin (CRESTOR) 40 MG tablet TAKE ONE TABLET BY MOUTH DAILY  . spironolactone (ALDACTONE) 25 MG tablet Take 0.5 tablets (12.5 mg total) by mouth daily.  Marland Kitchen torsemide (DEMADEX) 20 MG tablet Take 1 tablet (20 mg total) as needed for weight gain of 3 lbs in  one day or 5 lbs in one week.  . traMADol (ULTRAM) 50 MG tablet Take 50 mg by mouth every 6 (six) hours as needed for moderate pain.     Allergies:   Simvastatin, Codeine phosphate, and Penicillins   Social History   Socioeconomic History  . Marital status: Married    Spouse name: Not on file  . Number of children: 4  . Years of education: Not on file  . Highest education level: Not on file  Occupational History  . Occupation: Naval architect  Tobacco Use  . Smoking status: Former Smoker    Packs/day: 1.00    Years: 30.00  Pack years: 30.00    Types: Cigarettes  . Smokeless tobacco: Never Used  . Tobacco comment: "quit smoking in the 1980s"  Vaping Use  . Vaping Use: Never used  Substance and Sexual Activity  . Alcohol use: No  . Drug use: No  . Sexual activity: Yes  Other Topics Concern  . Not on file  Social History Narrative  . Not on file   Social Determinants of Health   Financial Resource Strain: Not on file  Food Insecurity: Not on file  Transportation Needs: Not on file  Physical Activity: Not on file  Stress: Not on file  Social Connections: Not on file     Family History:  The patient's   family history includes Atrial fibrillation in his sister; Cancer in his mother; Diabetes in his father; Heart attack in his maternal grandmother, paternal grandfather, and paternal grandmother; Heart disease in his sister; Stroke in his maternal grandfather.   ROS:   Please see the history of present illness.    ROS All other systems reviewed and are negative.   PHYSICAL EXAM:   VS:  BP 128/74   Pulse 90   Ht 6' (1.829 m)   Wt 257 lb 12.8 oz (116.9 kg)   SpO2 98%   BMI 34.96 kg/m   Physical Exam  GEN: Obese, in no acute distress  Neck: no JVD, carotid bruits, or masses Cardiac: irreg irreg 1/6 systolic murmur LSB Respiratory:  clear to auscultation bilaterally, normal work of breathing GI: soft, nontender, nondistended, + BS Ext: trace edema otherwise  without cyanosis, clubbing,  Good distal pulses bilaterally Neuro:  Alert and Oriented x 3 Psych: euthymic mood, full affect  Wt Readings from Last 3 Encounters:  09/24/20 257 lb 12.8 oz (116.9 kg)  03/13/20 (!) 265 lb (120.2 kg)  07/04/19 265 lb 9.6 oz (120.5 kg)      Studies/Labs Reviewed:   EKG:  EKG is  ordered today.  The ekg Afib at 90/m PVC with nonspecific ST changes. Recent Labs: 03/13/2020: Hemoglobin 15.0; Platelets 219 03/24/2020: BUN 23; Creatinine, Ser 1.36; Potassium 4.9; Sodium 141   Lipid Panel    Component Value Date/Time   CHOL 149 10/20/2017 1220   TRIG 96 10/20/2017 1220   HDL 49 10/20/2017 1220   CHOLHDL 3.0 10/20/2017 1220   CHOLHDL 2.5 05/10/2016 1145   VLDL 19 05/10/2016 1145   LDLCALC 81 10/20/2017 1220    Additional studies/ records that were reviewed today include:  Echo 09/27/2019  IMPRESSIONS     1. Mild to moderate global reduction in LV systolic function; mild LVH;  mild to moderate dilatation of aortic root (45 mm; suggest CTA or MRA to  further assess); mild AI, MR and TR.   2. Left ventricular ejection fraction, by estimation, is 40 to 45%. The  left ventricle has mild to moderately decreased function. The left  ventrical demonstrates global hypokinesis. There is mildly increased left  ventricular hypertrophy. Left  ventricular diastolic function could not be evaluated.   3. Right ventricular systolic function is normal. The right ventricular  size is normal.   4. The mitral valve is normal in structure and function. Mild mitral  valve regurgitation. No evidence of mitral stenosis.   5. The aortic valve is tricuspid. Aortic valve regurgitation is mild.  Mild to moderate aortic valve sclerosis/calcification is present, without  any evidence of aortic stenosis.   6. Aortic dilatation noted. There is mild to moderate dilatation of the  aortic  root measuring 45 mm.   FINDINGS   Left Ventricle: Left ventricular ejection fraction, by  estimation, is 40  to 45%. The left ventricle has mild to moderately decreased function. The  left ventricle demonstrates global hypokinesis. The left ventricular  internal cavity size was normal in  size. There is mildly increased left ventricular hypertrophy. The left  ventricular diastology could not be evaluated due to atrial fibrillation.   Right Ventricle: The right ventricular size is normal. Right ventricular  systolic function is normal.   Left Atrium: Left atrial size was normal in size.   Right Atrium: Right atrial size was normal in size.   Pericardium: There is no evidence of pericardial effusion.   Mitral Valve: The mitral valve is normal in structure and function. Normal  mobility of the mitral valve leaflets. Mild mitral annular calcification.  Mild mitral valve regurgitation. No evidence of mitral valve stenosis.   Tricuspid Valve: The tricuspid valve is normal in structure. Tricuspid  valve regurgitation is trivial. No evidence of tricuspid stenosis.   Aortic Valve: The aortic valve is tricuspid. Aortic valve regurgitation is  mild. Aortic regurgitation PHT measures 669 msec. Mild to moderate aortic  valve sclerosis/calcification is present, without any evidence of aortic  stenosis.   Pulmonic Valve: The pulmonic valve was not well visualized. Pulmonic valve  regurgitation is not visualized. No evidence of pulmonic stenosis.   Aorta: Aortic dilatation noted. There is mild to moderate dilatation of  the aortic root measuring 45 mm.   Venous: The inferior vena cava was not well visualized.   IAS/Shunts: No atrial level shunt detected by color flow Doppler.   Additional Comments: Mild to moderate global reduction in LV systolic  function; mild LVH; mild to moderate dilatation of aortic root (45 mm;  suggest CTA or MRA to further assess); mild AI, MR and TR.     LEFT VENTRICLE  PLAX 2D  LVIDd:         4.33 cm  LVIDs:         3.48 cm  LV PW:         1.30 cm   LV IVS:        1.17 cm  LVOT diam:     2.50 cm  LV SV Index:   13.62  LVOT Area:     4.91 cm   CTA 10/19/2019  FINDINGS: Cardiovascular: Trivalvular aortic root without valvular thickening or calcification. Mild and moderate four-vessel coronary calcification and normal heart size. Normal contrast enhancement of the thoracic aorta lumen without dissection or intimal injury. Aberrant right subclavian artery with small 2 cm diverticulum of Kommerell. Mild calcified atherosclerosis of the thoracic aortic arch and great vessel origins. Similar 46.6 mm aortic diameter at the sinuses of Valsalva; 42.0 mm at the tubular aorta; 36.5 mm at the proximal arch; 31.0 mm at the distal arch; and 27.0 mm of the distal descending aorta.    IMPRESSION: Stable mild fusiform aneurysmal dilatation of the thoracic aorta at the sinuses of Valsalva and tubular ascending segment compared to February 2019. Thoracic aorta mild calcified atherosclerosis. Recommend annual imaging followup by CTA or MRA. This recommendation follows 2010 ACCF/AHA/AATS/ACR/ASA/SCA/SCAI/SIR/STS/SVM Guidelines for the Diagnosis and Management of Patients with Thoracic Aortic Disease. Circulation. 2010; 121: J188-C166. Aortic aneurysm NOS (ICD10-I71.9)   Stable aberrant right subclavian artery.   Four-vessel coronary calcification.   A 7 mm hepatic hypervascular lesion, possibly a benign hemangioma, not previously imaged. Given the patient history of hepatitis, surveillance MR abdomen  liver protocol recommended in 3-6 months.   Stable size of a 17 mm right adrenal nodule incompletely characterized and measuring 20 Hounsfield units on unenhanced imaging, probably a benign adenoma given the 62-monthstability. Normal left adrenal gland.     Electronically Signed   By: RRevonda Humphrey  On: 10/19/2019 23:51   Risk Assessment/Calculations:    CHA2DS2-VASc Score = 5  This indicates a 7.2% annual risk of stroke. The  patient's score is based upon: CHF History: Yes HTN History: Yes Diabetes History: No Stroke History: No Vascular Disease History: Yes Age Score: 2 Gender Score: 0        ASSESSMENT:    1. Permanent atrial fibrillation ([place]   2. Coronary artery disease involving native coronary artery of native heart without angina pectoris   3. Ischemic cardiomyopathy   4. Chronic combined systolic and diastolic CHF (congestive heart failure) ([place]   5. Thoracic aortic aneurysm without rupture (HBertram   6. Essential hypertension   7. Hyperlipidemia, unspecified hyperlipidemia type   8. OSA (obstructive sleep apnea)      PLAN:  In order of problems listed above:  Permanent atrial fibrillation on Eliquis 5 mg bid and toprol xl. No bleeding problems. For blood work in 2 weeks with PCP  CAD status post DES to the mid RCA 10/2017 with 85% ostial D2, occluded RPDA no aspirin due to DOAC continue Plavix beta-blocker and statin-no angina  Dilated cardiomyopathy ejection fraction 35 to 40% on echo 2019 improved to 40 to 45% on echo 09/2019 on Entresto, spironolactone, torsemide-prn and Toprol. compensated  Chronic combined systolic and diastolic CHF compensated. Rarely takes demadex  Dilated aortic root dilated sinuses of Valsalva 46 mm on chest CTA 3/20219 needs repeat 09/2020-will schedule after bmet checked in 2 weeks.  Hypertension BP controlled  Hyperlipidemia LDL goal less than 70 on Crestor and Zetia-due for repeat labs  OSA on CPAP managed by Dr. TRadford Pax    Shared Decision Making/Informed Consent        Medication Adjustments/Labs and Tests Ordered: Current medicines are reviewed at length with the patient today.  Concerns regarding medicines are outlined above.  Medication changes, Labs and Tests ordered today are listed in the Patient Instructions below. Patient Instructions  Medication Instructions:  Your physician recommends that you continue on your current medications as  directed. Please refer to the Current Medication list given to you today.  *If you need a refill on your cardiac medications before your next appointment, please call your pharmacy*   Lab Work: None today If you have labs (blood work) drawn today and your tests are completely normal, you will receive your results only by: .Marland KitchenMyChart Message (if you have MyChart) OR . A paper copy in the mail If you have any lab test that is abnormal or we need to change your treatment, we will call you to review the results.   Follow-Up: [facility] you and your health needs are our priority.  As part of our continuing mission to provide you with exceptional heart care, we have created designated Provider Care Teams.  These Care Teams include your primary Cardiologist (physician) and Advanced Practice Providers (APPs -  Physician Assistants and Nurse Practitioners) who all work together to provide you with the care you need, when you need it.  We recommend signing up for the patient portal called "MyChart".  Sign up information is provided on this After Visit Summary.  MyChart is used to connect with  patients for Virtual Visits (Telemedicine).  Patients are able to view lab/test results, encounter notes, upcoming appointments, etc.  Non-urgent messages can be sent to your provider as well.   To learn more about what you can do with MyChart, go to [email or web address].    Your next appointment:   6 month(s)  The format for your next appointment:   In Person  Provider:   Gwyndolyn Kaufman, MD       Signed, Ermalinda Barrios, PA-C  09/24/2020 1:44 PM    [address], [place], [place]  [address] Phone: [phone]; Fax: [phone]

## 2020-09-24 ENCOUNTER — Encounter: Payer: Self-pay | Admitting: Physician Assistant

## 2020-09-24 ENCOUNTER — Ambulatory Visit: Payer: PPO | Admitting: Physician Assistant

## 2020-09-24 ENCOUNTER — Encounter (INDEPENDENT_AMBULATORY_CARE_PROVIDER_SITE_OTHER): Payer: Self-pay

## 2020-09-24 ENCOUNTER — Other Ambulatory Visit: Payer: Self-pay

## 2020-09-24 VITALS — BP 128/74 | HR 90 | Ht 72.0 in | Wt 257.8 lb

## 2020-09-24 DIAGNOSIS — I5042 Chronic combined systolic (congestive) and diastolic (congestive) heart failure: Secondary | ICD-10-CM

## 2020-09-24 DIAGNOSIS — I10 Essential (primary) hypertension: Secondary | ICD-10-CM

## 2020-09-24 DIAGNOSIS — I2510 Atherosclerotic heart disease of native coronary artery without angina pectoris: Secondary | ICD-10-CM | POA: Diagnosis not present

## 2020-09-24 DIAGNOSIS — E785 Hyperlipidemia, unspecified: Secondary | ICD-10-CM

## 2020-09-24 DIAGNOSIS — I255 Ischemic cardiomyopathy: Secondary | ICD-10-CM

## 2020-09-24 DIAGNOSIS — I712 Thoracic aortic aneurysm, without rupture, unspecified: Secondary | ICD-10-CM

## 2020-09-24 DIAGNOSIS — G4733 Obstructive sleep apnea (adult) (pediatric): Secondary | ICD-10-CM | POA: Diagnosis not present

## 2020-09-24 DIAGNOSIS — I4821 Permanent atrial fibrillation: Secondary | ICD-10-CM | POA: Diagnosis not present

## 2020-09-24 NOTE — Patient Instructions (Signed)
Medication Instructions:  Your physician recommends that you continue on your current medications as directed. Please refer to the Current Medication list given to you today.  *If you need a refill on your cardiac medications before your next appointment, please call your pharmacy*   Lab Work: None today If you have labs (blood work) drawn today and your tests are completely normal, you will receive your results only by: Marland Kitchen MyChart Message (if you have MyChart) OR . A paper copy in the mail If you have any lab test that is abnormal or we need to change your treatment, we will call you to review the results.   Follow-Up: At [facility], you and your health needs are our priority.  As part of our continuing mission to provide you with exceptional heart care, we have created designated Provider Care Teams.  These Care Teams include your primary Cardiologist (physician) and Advanced Practice Providers (APPs -  Physician Assistants and Nurse Practitioners) who all work together to provide you with the care you need, when you need it.  We recommend signing up for the patient portal called "MyChart".  Sign up information is provided on this After Visit Summary.  MyChart is used to connect with patients for Virtual Visits (Telemedicine).  Patients are able to view lab/test results, encounter notes, upcoming appointments, etc.  Non-urgent messages can be sent to your provider as well.   To learn more about what you can do with MyChart, go to [email or web address].    Your next appointment:   6 month(s)  The format for your next appointment:   In Person  Provider:   Gwyndolyn Kaufman, MD

## 2020-09-30 DIAGNOSIS — I480 Paroxysmal atrial fibrillation: Secondary | ICD-10-CM | POA: Diagnosis not present

## 2020-09-30 DIAGNOSIS — Z79899 Other long term (current) drug therapy: Secondary | ICD-10-CM | POA: Diagnosis not present

## 2020-09-30 DIAGNOSIS — I509 Heart failure, unspecified: Secondary | ICD-10-CM | POA: Diagnosis not present

## 2020-09-30 DIAGNOSIS — E7800 Pure hypercholesterolemia, unspecified: Secondary | ICD-10-CM | POA: Diagnosis not present

## 2020-09-30 DIAGNOSIS — Z0000 Encounter for general adult medical examination without abnormal findings: Secondary | ICD-10-CM | POA: Diagnosis not present

## 2020-09-30 DIAGNOSIS — D692 Other nonthrombocytopenic purpura: Secondary | ICD-10-CM | POA: Diagnosis not present

## 2020-09-30 DIAGNOSIS — K219 Gastro-esophageal reflux disease without esophagitis: Secondary | ICD-10-CM | POA: Diagnosis not present

## 2020-09-30 DIAGNOSIS — M5442 Lumbago with sciatica, left side: Secondary | ICD-10-CM | POA: Diagnosis not present

## 2020-09-30 DIAGNOSIS — N1831 Chronic kidney disease, stage 3a: Secondary | ICD-10-CM | POA: Diagnosis not present

## 2020-09-30 DIAGNOSIS — I129 Hypertensive chronic kidney disease with stage 1 through stage 4 chronic kidney disease, or unspecified chronic kidney disease: Secondary | ICD-10-CM | POA: Diagnosis not present

## 2020-09-30 DIAGNOSIS — G8929 Other chronic pain: Secondary | ICD-10-CM | POA: Diagnosis not present

## 2020-09-30 DIAGNOSIS — D6869 Other thrombophilia: Secondary | ICD-10-CM | POA: Diagnosis not present

## 2020-10-10 ENCOUNTER — Telehealth: Payer: Self-pay

## 2020-10-10 MED ORDER — APIXABAN 2.5 MG PO TABS: 2.5000 mg | ORAL_TABLET | Freq: Two times a day (BID) | ORAL | 3 refills | Status: DC

## 2020-10-10 NOTE — Telephone Encounter (Signed)
Spoke with the patient and he will decrease Eliquis to 2.5mg  BID.

## 2020-10-10 NOTE — Telephone Encounter (Signed)
Patient returning call.

## 2020-10-10 NOTE — Telephone Encounter (Signed)
Left message with patient's wife for patient to call back.  Lab results were received from patient's PCP and per Dr. Radford Pax he needs to change Eliquis to 2.5 mg twice daily due to Scr> 1.5 and age> 82.

## 2020-10-13 ENCOUNTER — Telehealth: Payer: Self-pay | Admitting: Cardiology

## 2020-10-13 DIAGNOSIS — N1831 Chronic kidney disease, stage 3a: Secondary | ICD-10-CM | POA: Diagnosis not present

## 2020-10-13 DIAGNOSIS — I129 Hypertensive chronic kidney disease with stage 1 through stage 4 chronic kidney disease, or unspecified chronic kidney disease: Secondary | ICD-10-CM | POA: Diagnosis not present

## 2020-10-13 DIAGNOSIS — G8929 Other chronic pain: Secondary | ICD-10-CM | POA: Diagnosis not present

## 2020-10-13 DIAGNOSIS — E7800 Pure hypercholesterolemia, unspecified: Secondary | ICD-10-CM | POA: Diagnosis not present

## 2020-10-13 DIAGNOSIS — K219 Gastro-esophageal reflux disease without esophagitis: Secondary | ICD-10-CM | POA: Diagnosis not present

## 2020-10-13 DIAGNOSIS — I509 Heart failure, unspecified: Secondary | ICD-10-CM | POA: Diagnosis not present

## 2020-10-13 DIAGNOSIS — I480 Paroxysmal atrial fibrillation: Secondary | ICD-10-CM | POA: Diagnosis not present

## 2020-10-13 MED ORDER — ENTRESTO 49-51 MG PO TABS: 1.0000 | ORAL_TABLET | Freq: Two times a day (BID) | ORAL | 3 refills | Status: DC

## 2020-10-13 NOTE — Telephone Encounter (Signed)
Spoke with Aldona Bar who states they are working on re-enrolling the patient for PA for [employer]. She states that his pharmacy will continue to fill until he is approved as long as they have a prescription. Prescription needs to be sent to [facility].

## 2020-10-13 NOTE — Telephone Encounter (Signed)
**Note De-Identified Shawnmichael Parenteau Obfuscation** Entresto refill was e-scribed to [facility] earlier today. I called to s/w Samantha at [facility] but got no answer so I left a VM asking her to call me back.

## 2020-10-13 NOTE — Telephone Encounter (Signed)
Pt c/o medication issue:  1. Name of Medication: ENTRESTO 49-51 MG  2. How are you currently taking this medication (dosage and times per day)? 1 tablet twice a day  3. Are you having a reaction (difficulty breathing--STAT)? no  4. What is your medication issue? Samantha from [facility] states they have been trying to get patient assistance for the medication for the patient. She states patient assist is not approving until the end of March due to being behind. She states they will be able to get him the medication as long as the prescription can be sent to the patient's pharmacy, [facility]. She would like a call back at: [phone]

## 2020-10-15 ENCOUNTER — Encounter: Payer: Self-pay | Admitting: *Deleted

## 2020-10-15 DIAGNOSIS — I712 Thoracic aortic aneurysm, without rupture, unspecified: Secondary | ICD-10-CM

## 2020-10-16 ENCOUNTER — Telehealth: Payer: Self-pay | Admitting: Cardiology

## 2020-10-16 MED ORDER — ENTRESTO 49-51 MG PO TABS: 1.0000 | ORAL_TABLET | Freq: Two times a day (BID) | ORAL | 3 refills | Status: DC

## 2020-10-16 NOTE — Telephone Encounter (Signed)
Spoke with Alyse Low at [facility] who states that they need the prescription for [facility] sent over to [facility]. Advised that prescription was sent in 2/28.  She states that there is a note from her assistant that she spoke with [facility] on 3/2 and they had not received an Rx.  Asked if she could provide me with the phone number of [facility] so that I could ensure medication was being sent to the proper place. She states she did not have their number but she would get it from her assistant and call back with their number and fax.

## 2020-10-16 NOTE — Telephone Encounter (Signed)
Samantha from [facility] called in and would like to speak with Percival Spanish ,   [facility]  Phone number [phone] Fax number - [phone]  This is where the Delene Loll needs to go Samantha's direct number if there are any questions [phone]

## 2020-10-16 NOTE — Telephone Encounter (Signed)
Prescription for Delene Loll has been sent in to the correct pharmacy.

## 2020-10-16 NOTE — Telephone Encounter (Signed)
New Message:    Pt says he is trying to get Pt Assistance for his Entresto. Christy at [facility] on [address], is helping him. She told him to get a prescription for Entresto from Dr Radford Pax and fax it to her please. He did not know the fax number.    *STAT* If patient is at the pharmacy, call can be transferred to refill team.   1. Which medications need to be refilled? (please list name of each medication and dose if known) a written prescription for Entresto for Pt Assiatance Program  2. Which pharmacy/location (including street and city if local pharmacy) is medication to be sent to? Chrsty at [facility] at [place]   3. Do they need a 30 day or 90 day supply?*

## 2020-10-22 ENCOUNTER — Other Ambulatory Visit: Payer: Self-pay

## 2020-10-22 ENCOUNTER — Ambulatory Visit (INDEPENDENT_AMBULATORY_CARE_PROVIDER_SITE_OTHER)
Admission: RE | Admit: 2020-10-22 | Discharge: 2020-10-22 | Disposition: A | Discharge status: Home / Self Care | Payer: PPO | Source: Ambulatory Visit | Attending: Cardiology | Admitting: Cardiology

## 2020-10-22 DIAGNOSIS — Z9049 Acquired absence of other specified parts of digestive tract: Secondary | ICD-10-CM | POA: Diagnosis not present

## 2020-10-22 DIAGNOSIS — K449 Diaphragmatic hernia without obstruction or gangrene: Secondary | ICD-10-CM | POA: Diagnosis not present

## 2020-10-22 DIAGNOSIS — I712 Thoracic aortic aneurysm, without rupture, unspecified: Secondary | ICD-10-CM

## 2020-10-22 DIAGNOSIS — I517 Cardiomegaly: Secondary | ICD-10-CM | POA: Diagnosis not present

## 2020-10-22 MED ORDER — IOHEXOL 350 MG/ML SOLN
75.0000 mL | Freq: Once | INTRAVENOUS | Status: AC | PRN
  Administered 2020-10-22: 75 mL via INTRAVENOUS

## 2020-10-24 ENCOUNTER — Telehealth: Payer: Self-pay | Admitting: *Deleted

## 2020-10-24 DIAGNOSIS — I712 Thoracic aortic aneurysm, without rupture, unspecified: Secondary | ICD-10-CM

## 2020-10-24 NOTE — Telephone Encounter (Signed)
-----   Message from Sueanne Margarita, MD sent at 10/24/2020  9:34 AM EST ----- Chest CTA showed stable thoracic aorta at the Sinuses of Valsalva at 4.6cm and  4.2cm in the ascending aorta.  there was coronary calcifications which corresponds to his known CAD.  There is also a stable right adrenal adenoma - please forward to PCP regarding followup of adrenal adenoma.  Repeat chest CTA in 1 year and please refer to Dr. Cyndia Bent to follow yearly

## 2020-10-24 NOTE — Telephone Encounter (Signed)
Two bottles of Entresto 49/51 to be left at front desk for patient.  I placed call to patient and left message that samples would be at front desk for him to pick up

## 2020-10-24 NOTE — Telephone Encounter (Signed)
I spoke with patient and reviewed results with him. Patient reports he has been reapproved for [employer].   He does not know when medicine will arrive and only has medicine to last through Monday.  He is asking if there are any samples in the office.  If not he will contact his pharmacy and fill a short term supply.  If he is not available he said it is OK to leave a message

## 2020-10-28 DIAGNOSIS — Z79899 Other long term (current) drug therapy: Secondary | ICD-10-CM | POA: Diagnosis not present

## 2020-10-29 DIAGNOSIS — Z947 Corneal transplant status: Secondary | ICD-10-CM | POA: Diagnosis not present

## 2020-10-29 DIAGNOSIS — H40003 Preglaucoma, unspecified, bilateral: Secondary | ICD-10-CM | POA: Diagnosis not present

## 2020-10-29 DIAGNOSIS — Z961 Presence of intraocular lens: Secondary | ICD-10-CM | POA: Diagnosis not present

## 2020-10-29 DIAGNOSIS — H18513 Endothelial corneal dystrophy, bilateral: Secondary | ICD-10-CM | POA: Diagnosis not present

## 2020-11-06 DIAGNOSIS — J029 Acute pharyngitis, unspecified: Secondary | ICD-10-CM | POA: Diagnosis not present

## 2020-11-06 DIAGNOSIS — I129 Hypertensive chronic kidney disease with stage 1 through stage 4 chronic kidney disease, or unspecified chronic kidney disease: Secondary | ICD-10-CM | POA: Diagnosis not present

## 2020-11-06 DIAGNOSIS — K219 Gastro-esophageal reflux disease without esophagitis: Secondary | ICD-10-CM | POA: Diagnosis not present

## 2020-11-06 DIAGNOSIS — Z03818 Encounter for observation for suspected exposure to other biological agents ruled out: Secondary | ICD-10-CM | POA: Diagnosis not present

## 2020-11-06 DIAGNOSIS — I509 Heart failure, unspecified: Secondary | ICD-10-CM | POA: Diagnosis not present

## 2020-11-06 DIAGNOSIS — G8929 Other chronic pain: Secondary | ICD-10-CM | POA: Diagnosis not present

## 2020-11-06 DIAGNOSIS — N1831 Chronic kidney disease, stage 3a: Secondary | ICD-10-CM | POA: Diagnosis not present

## 2020-11-06 DIAGNOSIS — E7800 Pure hypercholesterolemia, unspecified: Secondary | ICD-10-CM | POA: Diagnosis not present

## 2020-11-06 DIAGNOSIS — I480 Paroxysmal atrial fibrillation: Secondary | ICD-10-CM | POA: Diagnosis not present

## 2020-11-12 ENCOUNTER — Encounter: Payer: PPO | Admitting: Surgery

## 2020-11-25 ENCOUNTER — Other Ambulatory Visit: Payer: Self-pay

## 2020-11-25 ENCOUNTER — Encounter: Payer: Self-pay | Admitting: Dermatology

## 2020-11-25 ENCOUNTER — Ambulatory Visit: Payer: PPO | Admitting: Dermatology

## 2020-11-25 DIAGNOSIS — L570 Actinic keratosis: Secondary | ICD-10-CM | POA: Diagnosis not present

## 2020-11-25 DIAGNOSIS — D044 Carcinoma in situ of skin of scalp and neck: Secondary | ICD-10-CM | POA: Diagnosis not present

## 2020-11-25 DIAGNOSIS — R21 Rash and other nonspecific skin eruption: Secondary | ICD-10-CM | POA: Diagnosis not present

## 2020-11-25 DIAGNOSIS — L309 Dermatitis, unspecified: Secondary | ICD-10-CM

## 2020-11-25 DIAGNOSIS — Z1283 Encounter for screening for malignant neoplasm of skin: Secondary | ICD-10-CM | POA: Diagnosis not present

## 2020-11-25 DIAGNOSIS — D485 Neoplasm of uncertain behavior of skin: Secondary | ICD-10-CM

## 2020-11-25 MED ORDER — CLOBETASOL PROPIONATE 0.05 % EX SOLN: 1.0000 "application " | Freq: Two times a day (BID) | CUTANEOUS | 0 refills | Status: DC

## 2020-11-25 NOTE — Patient Instructions (Addendum)
Pick up Dermend over the counter lotion for the skin for daily use.   Biopsy, Surgery (Curettage) & Surgery (Excision) Aftercare Instructions  1. Okay to remove bandage in 24 hours  2. Wash area with soap and water  3. Apply Vaseline to area twice daily until healed (Not Neosporin)  4. Okay to cover with a Band-Aid to decrease the chance of infection or prevent irritation from clothing; also it's okay to uncover lesion at home.  5. Suture instructions: return to our office in 7-10 or 10-14 days for a nurse visit for suture removal. Variable healing with sutures, if pain or itching occurs call our office. It's okay to shower or bathe 24 hours after sutures are given.  6. The following risks may occur after a biopsy, curettage or excision: bleeding, scarring, discoloration, recurrence, infection (redness, yellow drainage, pain or swelling).  7. For questions, concerns and results call our office at [place] before 4pm & Friday before 3pm. Biopsy results will be available in 1 week.

## 2020-12-01 ENCOUNTER — Telehealth: Payer: Self-pay | Admitting: Dermatology

## 2020-12-01 NOTE — Telephone Encounter (Signed)
Patient left message on office voice mail that he was calling for pathology results from last visit with Stuart Tafeen, MD. 

## 2020-12-02 NOTE — Telephone Encounter (Signed)
Called patient to let him know that pathology results aren't back yet. Told to call us end of this week or next week

## 2020-12-02 NOTE — Telephone Encounter (Signed)
Patient is calling again for results and also wants to know when he is supposed to start using the prescription for Clobetasol for his scalp.

## 2020-12-04 DIAGNOSIS — E7800 Pure hypercholesterolemia, unspecified: Secondary | ICD-10-CM | POA: Diagnosis not present

## 2020-12-04 DIAGNOSIS — K219 Gastro-esophageal reflux disease without esophagitis: Secondary | ICD-10-CM | POA: Diagnosis not present

## 2020-12-04 DIAGNOSIS — N1831 Chronic kidney disease, stage 3a: Secondary | ICD-10-CM | POA: Diagnosis not present

## 2020-12-04 DIAGNOSIS — I480 Paroxysmal atrial fibrillation: Secondary | ICD-10-CM | POA: Diagnosis not present

## 2020-12-04 DIAGNOSIS — G8929 Other chronic pain: Secondary | ICD-10-CM | POA: Diagnosis not present

## 2020-12-04 DIAGNOSIS — I509 Heart failure, unspecified: Secondary | ICD-10-CM | POA: Diagnosis not present

## 2020-12-04 DIAGNOSIS — I129 Hypertensive chronic kidney disease with stage 1 through stage 4 chronic kidney disease, or unspecified chronic kidney disease: Secondary | ICD-10-CM | POA: Diagnosis not present

## 2020-12-05 ENCOUNTER — Other Ambulatory Visit: Payer: Self-pay | Admitting: Dermatology

## 2020-12-05 DIAGNOSIS — R21 Rash and other nonspecific skin eruption: Secondary | ICD-10-CM

## 2020-12-06 ENCOUNTER — Encounter: Payer: Self-pay | Admitting: Dermatology

## 2020-12-06 NOTE — Progress Notes (Signed)
   Follow-Up Visit   Subjective  [name] is a 86 y.o. male who presents for the following: New Patient (Initial Visit) (Patient here today for refill on TAC that he uses on his scalp.) and Herpes Zoster.  General skin examination, would like refill for triamcinolone. Location:  Duration:  Quality:  Associated Signs/Symptoms: Modifying Factors:  Severity:  Timing: Context:   Objective  Well appearing patient in no apparent distress; mood and affect are within normal limits. Objective  Mid Frontal Scalp (8): 5 mm gritty pink crust  Objective  Chest - Medial [facility]): Waist up skin examination: No atypical pigmented lesions.  Probable skin cancer on scalp which will be biopsied and treated.  Objective  Mid Frontal Scalp: 1.6 cm thick pink crust.     Objective  Mid Parietal Scalp: Mixed dermatitis plus diffuse sun damage on scalp.    All skin waist up examined.   Assessment & Plan    AK (actinic keratosis) (8) Mid Frontal Scalp  Destruction of lesion - Mid Frontal Scalp Complexity: simple   Destruction method: cryotherapy   Informed consent: discussed and consent obtained   Timeout:  patient name, date of birth, surgical site, and procedure verified Lesion destroyed using liquid nitrogen: Yes   Cryotherapy cycles:  4 Outcome: patient tolerated procedure well with no complications   Post-procedure details: wound care instructions given    Screening exam for skin cancer Chest - Medial (Center)  Annual skin examination.  Neoplasm of uncertain behavior of skin Mid Frontal Scalp  Skin / nail biopsy Type of biopsy: tangential   Informed consent: discussed and consent obtained   Timeout: patient name, date of birth, surgical site, and procedure verified   Procedure prep:  Patient was prepped and draped in usual sterile fashion (Non sterile) Prep type:  Chlorhexidine Anesthesia: the lesion was anesthetized in a standard fashion   Anesthetic:  1%  lidocaine w/ epinephrine 1-100,000 local infiltration Instrument used: flexible razor blade   Outcome: patient tolerated procedure well   Post-procedure details: wound care instructions given    Destruction of lesion Complexity: simple   Destruction method: cryotherapy   Informed consent: discussed and consent obtained   Timeout:  patient name, date of birth, surgical site, and procedure verified Lesion destroyed using liquid nitrogen: Yes   Cryotherapy cycles:  3 Lesion length (cm):  2.1 Lesion width (cm):  2.1 Margin per side (cm):  0 Final wound size (cm):  2.1 Outcome: patient tolerated procedure well with no complications    Specimen 1 - Surgical pathology Differential Diagnosis: bcc scc curet and cautery  Check Margins: No  After shave biopsy, deeper keratin was treated with triple curettage plus cautery.  Rash and other nonspecific skin eruption Scalp  clobetasol (TEMOVATE) 0.05 % external solution - Scalp  Dermatitis Mid Parietal Scalp  May use topical triamcinolone periodically for flares.  Consider adding a topical fluorouracil in the late fall or winter.      I, Lavonna Monarch, MD, have reviewed all documentation for this visit.  The documentation on 12/06/20 for the exam, diagnosis, procedures, and orders are all accurate and complete.

## 2020-12-08 NOTE — Addendum Note (Signed)
Addended by: Lavonna Monarch on: 12/08/2020 07:02 AM   Modules accepted: Level of Service

## 2020-12-09 ENCOUNTER — Encounter: Payer: PPO | Admitting: Surgery

## 2020-12-11 ENCOUNTER — Telehealth: Payer: Self-pay | Admitting: Dermatology

## 2020-12-11 NOTE — Telephone Encounter (Signed)
Left message saying he was calling to tell us how he was doing after procedure a few weeks ago, BUT didn't bother actually telling us in message.

## 2020-12-12 ENCOUNTER — Encounter: Payer: PPO | Admitting: Surgery

## 2020-12-15 ENCOUNTER — Encounter: Payer: Self-pay | Admitting: Surgery

## 2020-12-15 ENCOUNTER — Institutional Professional Consult (permissible substitution): Payer: PPO | Admitting: Surgery

## 2020-12-15 ENCOUNTER — Other Ambulatory Visit: Payer: Self-pay

## 2020-12-15 VITALS — BP 132/57 | HR 62 | Resp 20 | Ht 72.0 in | Wt 242.0 lb

## 2020-12-15 DIAGNOSIS — I712 Thoracic aortic aneurysm, without rupture, unspecified: Secondary | ICD-10-CM

## 2020-12-16 ENCOUNTER — Encounter: Payer: Self-pay | Admitting: Surgery

## 2020-12-16 NOTE — Progress Notes (Signed)
Cardiothoracic Surgery Consultation  PCP is Lajean Manes, MD Referring Provider is Sueanne Margarita, MD  Chief Complaint  Patient presents with  . Thoracic Aortic Aneurysm    Initial surgical consult, CTA chest 10/24/20    HPI:  The patient is an 86 year old gentleman with a history of hypertension, hyperlipidemia, permanent atrial fibrillation on Eliquis, coronary artery disease status post stenting of the right coronary artery in March 2019, dilated cardiomyopathy with ejection fraction of 40-45% by echo in February 2021, and known aortic root dilatation with a diameter of 4.4 cm dating back to 2016.  This has been followed periodically by echocardiogram and CTA of the chest.  His most recent CT of the chest on 10/22/2020 showed a diameter at the sinus level of 4.0 x 4.6 cm.  The ascending aorta was 3.9 x 3.8 cm.  Aortic arch was 3.6 x 3.1 cm.  The distal thoracic aorta was 2.8 x 2.9 cm.  There was a variant origin of the right subclavian artery.  He denies any chest or back pain.  There is no family history of aortic aneurysm, connective tissue disorder, or aortic dissection.  He is here today with his wife.  He has not very active due to severe degenerative joint disease. Past Medical History:  Diagnosis Date  . Aortic aneurysm (HCC)    40m at sinuses of Valsalva and 413mascending aorta by chest CTA 10/2019  . Benign essential HTN 09/17/2014  . CAD (coronary artery disease), native coronary artery 09/20/2014   30% LM at the ostium, 70-80% ostial D2, 30-40% prox LCX, 30-40% prox and 40% mid RCA and EF 40%. 11/07/17 DES to RCA   . Chronic lower back pain   . DCM (dilated cardiomyopathy) (HC[facility]2/09/2014   a.  EF 40%; b. Echo 5/16:  EF 40-45%, diff HK, inf AK, mild AI, MAC, severe LAE  . Dilated aortic root ([facility]CT[facility]02/09/2014   4130mt SInus of Valsalva on echo 09/2018  . DJD (degenerative joint disease)    feet, knees and back  . GERD (gastroesophageal reflux disease)    hiatal hernia   . Hepatitis 1953   "A or B; not sure which"  . High cholesterol   . History of blood transfusion 1954   "related to hip OR"  . History of hip surgery 1954   "knocked hips out of joints; had to have bone grafts; both sides"  . Hyperlipidemia LDL goal <70 10/14/2015  . Lesion of left femoral nerve    3 cm MRI 04/2010 consistent with hemangioma  . Mild aortic insufficiency   . Obesity   . OSA (obstructive sleep apnea) 10/14/2015   Mild with AHI 10.6/hr overall and 38/hr during REM sleep.  On CPAP at 9cm H2O  . OSA on CPAP   . Osteoarthritis    "all over" (11/07/2017)  . PAF (paroxysmal atrial fibrillation) ([place]2/09/2014  . PSA (psoriatic arthritis) (HCC)    Mild increase 3.65 on 05/2011 stable 3.68 on 09/2011  . Renal cyst, left 2010   5 cm seen on ultrasound     Past Surgical History:  Procedure Laterality Date  . APPENDECTOMY    . BACK SURGERY    . CARDIOVERSION N/A 11/04/2014   Procedure: CARDIOVERSION;  Surgeon: TraSueanne MargaritaD;  [facility];  Service: Cardiovascular;  Laterality: N/A;  . CARDIOVERSION N/A 01/09/2015   Procedure: CARDIOVERSION;  Surgeon: KenPixie CasinoD;  [facility] [place];  Laterality: N/A;  .  CATARACT EXTRACTION W/ INTRAOCULAR LENS  IMPLANT, BILATERAL Bilateral   . CORONARY STENT INTERVENTION N/A 11/07/2017   Procedure: CORONARY STENT INTERVENTION;  Surgeon: Leonie Man, MD;  Location: [place] CV LAB;  Service: Cardiovascular;  Laterality: N/A;  . HERNIA REPAIR    . JOINT REPLACEMENT    . LAPAROSCOPIC CHOLECYSTECTOMY    . LEFT HEART CATHETERIZATION WITH CORONARY ANGIOGRAM N/A 10/01/2014   Procedure: LEFT HEART CATHETERIZATION WITH CORONARY ANGIOGRAM;  Surgeon: Troy Sine, MD;  [facility];  Service: Cardiovascular;  Laterality: N/A;  . LUMBAR LAMINECTOMY    . REVISION TOTAL HIP ARTHROPLASTY Right   . RIGHT/LEFT HEART CATH AND CORONARY ANGIOGRAPHY N/A 11/07/2017   Procedure: RIGHT/LEFT  HEART CATH AND CORONARY ANGIOGRAPHY;  Surgeon: Leonie Man, MD;  Location: [place] CV LAB;  Service: Cardiovascular;  Laterality: N/A;  . SHOULDER OPEN ROTATOR CUFF REPAIR Bilateral   . TONSILLECTOMY    . TOTAL HIP ARTHROPLASTY Bilateral   . TOTAL KNEE ARTHROPLASTY Bilateral   . UMBILICAL HERNIA REPAIR      Family History  Problem Relation Age of Onset  . Cancer Mother   . Diabetes Father   . Heart disease Sister   . Atrial fibrillation Sister   . Heart attack Maternal Grandmother   . Stroke Maternal Grandfather   . Heart attack Paternal Grandmother   . Heart attack Paternal Grandfather     Social History Social History   Tobacco Use  . Smoking status: Former Smoker    Packs/day: 1.00    Years: 30.00    Pack years: 30.00    Types: Cigarettes  . Smokeless tobacco: Never Used  . Tobacco comment: "quit smoking in the 1980s"  Vaping Use  . Vaping Use: Never used  Substance Use Topics  . Alcohol use: No  . Drug use: No    Current Outpatient Medications  Medication Sig Dispense Refill  . acetaminophen (TYLENOL) 650 MG CR tablet Take 650 mg by mouth every 8 (eight) hours as needed for pain.    Marland Kitchen apixaban (ELIQUIS) 2.5 MG TABS tablet Take 1 tablet (2.5 mg total) by mouth 2 (two) times daily. 180 tablet 3  . clobetasol (TEMOVATE) 0.05 % external solution Apply 1 application topically 2 (two) times daily. 50 mL 3  . clopidogrel (PLAVIX) 75 MG tablet TAKE ONE TABLET BY MOUTH EVERY MORNING 90 tablet 2  . ezetimibe (ZETIA) 10 MG tablet Take 1 tablet (10 mg total) by mouth daily. 90 tablet 3  . ketoconazole (NIZORAL) 2 % cream Apply topically 2 (two) times daily.    . meloxicam (MOBIC) 7.5 MG tablet Take 7.5 mg by mouth daily.    . metoprolol succinate (TOPROL-XL) 25 MG 24 hr tablet Take 0.5 tablets (12.5 mg total) by mouth daily. Take with or immediately following a meal. 45 tablet 2  . MULTIPLE VITAMINS PO Take 1 tablet by mouth daily.    . pantoprazole (PROTONIX) 40 MG  tablet Take 1 tablet (40 mg total) by mouth daily. 30 tablet 1  . prednisoLONE acetate (PRED FORTE) 1 % ophthalmic suspension     . rosuvastatin (CRESTOR) 40 MG tablet TAKE ONE TABLET BY MOUTH DAILY 90 tablet 3  . sacubitril-valsartan (ENTRESTO) 49-51 MG Take 1 tablet by mouth 2 (two) times daily. 180 tablet 3  . spironolactone (ALDACTONE) 25 MG tablet Take 0.5 tablets (12.5 mg total) by mouth daily. 45 tablet 3  . torsemide (DEMADEX) 20 MG tablet Take 1 tablet (20 mg  total) as needed for weight gain of 3 lbs in one day or 5 lbs in one week. 180 tablet 3  . traMADol (ULTRAM) 50 MG tablet Take 50 mg by mouth every 6 (six) hours as needed for moderate pain.    Marland Kitchen triamcinolone cream (KENALOG) 0.1 % Apply topically 2 (two) times daily.     No current facility-administered medications for this visit.    Allergies  Allergen Reactions  . Simvastatin Other (See Comments)    Muscle pain elevated CPK  . Codeine Phosphate Other (See Comments)    constipation  . Penicillins Rash    NOT AN ALLERGY PER PT, thinks the needle used dirty    Review of Systems  Constitutional: Negative for activity change and fatigue.  HENT: Negative.   Eyes: Negative.   Respiratory: Negative for shortness of breath.   Cardiovascular: Negative for chest pain.  Gastrointestinal: Negative.   Genitourinary: Negative.   Musculoskeletal: Positive for arthralgias, gait problem and joint swelling.  Skin: Negative.   Neurological: Negative for dizziness and syncope.  Hematological: Negative.   Psychiatric/Behavioral: Negative.     BP (!) 132/57 (BP Location: Right Arm, Patient Position: Sitting)   Pulse 62   Resp 20   Ht 6' (1.829 m)   Wt 242 lb (109.8 kg)   SpO2 99% Comment: RA  BMI 32.82 kg/m  Physical Exam Constitutional:      Appearance: Normal appearance. He is obese.  HENT:     Head: Normocephalic and atraumatic.     Mouth/Throat:     Mouth: Mucous membranes are moist.     Pharynx: Oropharynx is clear.   Eyes:     Extraocular Movements: Extraocular movements intact.     Conjunctiva/sclera: Conjunctivae normal.     Pupils: Pupils are equal, round, and reactive to light.  Neck:     Vascular: No carotid bruit.  Cardiovascular:     Rate and Rhythm: Normal rate. Rhythm irregular.     Pulses: Normal pulses.     Heart sounds: Normal heart sounds. No murmur heard.   Pulmonary:     Effort: Pulmonary effort is normal.     Breath sounds: Normal breath sounds.  Musculoskeletal:        General: No swelling.     Cervical back: Neck supple.  Skin:    General: Skin is warm and dry.  Neurological:     General: No focal deficit present.     Mental Status: He is alert and oriented to person, place, and time.  Psychiatric:        Mood and Affect: Mood normal.        Behavior: Behavior normal.      Diagnostic Tests:  Narrative & Impression  CLINICAL DATA:  Thoracic aortic aneurysm, follow-up examination  EXAM: CT ANGIOGRAPHY CHEST WITH CONTRAST  TECHNIQUE: Multidetector CT imaging of the chest was performed using the standard protocol during bolus administration of intravenous contrast. Multiplanar CT image reconstructions and MIPs were obtained to evaluate the vascular anatomy.  CONTRAST:  50m OMNIPAQUE IOHEXOL 350 MG/ML SOLN  COMPARISON:  09/30/2017  FINDINGS: Cardiovascular:  The thoracic aorta measures:  Sinuses of Valsalva: 4.0 x 4.6 cm  Ascending aorta: 3.9 x 3.8 cm  Aortic arch (immediately posterior to the left subclavian artery origin) 3.6 x 3.1 cm  Distal thoracic aorta (left atrium): 2.8 x 2.9 cm  No intramural hematoma or dissection. Aberrant origin of the right subclavian artery. Mild scattered atherosclerotic plaque throughout the abdominal aorta and  proximal arch vasculature. Wide patency of the proximal arch vasculature. Less than 50% stenosis of the a left vertebral artery at its origin.  Mild cardiomegaly with particular enlargement of  the left ventricle and left atrium. Extensive multi-vessel coronary artery calcification. The aortic valve is trileaflet. Central pulmonary arteries are of normal caliber.  Mediastinum/Nodes: Visualized thyroid unremarkable. No pathologic thoracic adenopathy. The esophagus is unremarkable. Small hiatal hernia.  Lungs/Pleura: Lungs are clear. No pleural effusion or pneumothorax. Central airways are widely patent.  Upper Abdomen: Status post cholecystectomy. Indeterminate right adrenal nodule measuring 2.4 cm is stable since remote prior examination of 09/19/2014, likely representing an adrenal adenoma. Limited images of the upper abdomen are otherwise unremarkable.  Musculoskeletal: Degenerative changes are seen within the thoracic spine. No lytic or blastic bone lesions.  Review of the MIP images confirms the above findings.  IMPRESSION: Stable thoracic aortic aneurysm. Maximal diameter of 4.6 cm at the level of the sinuses of Valsalva. Ascending thoracic aortic aneurysm. Recommend semi-annual imaging followup by CTA or MRA and referral to cardiothoracic surgery if not already obtained. This recommendation follows 2010 ACCF/AHA/AATS/ACR/ASA/SCA/SCAI/SIR/STS/SVM Guidelines for the Diagnosis and Management of Patients With Thoracic Aortic Disease. Circulation. 2010; 121: Z610-R604. Aortic aneurysm NOS (ICD10-I71.9)  Extensive coronary artery calcification. Mild cardiomegaly with left ventricular dilation and left atrial enlargement.  Stable probable right adrenal adenoma.  Aortic Atherosclerosis (ICD10-I70.0). Aortic aneurysm NOS (ICD10-I71.9).   Electronically Signed   By: Fidela Salisbury MD   On: 10/24/2020 00:01     ECHOCARDIOGRAM LIMITED REPORT       Patient Name:  RENNY REMER Date of Exam: 09/27/2019  Medical Rec #: [id number]   Height:    72.0 in  Accession #:  [id number]   Weight:    265.6 lb  Date of Birth: May 26, 1935   BSA:      2.40 m  Patient Age:  89 years    BP:      142/77 mmHg  Patient Gender: M       HR:      87 bpm.  Exam Location: [place]   Procedure: Limited Echo, Cardiac Doppler and Limited Color Doppler   Indications:  I778.19 Dilatation of aorta    History:    Patient has prior history of Echocardiogram examinations,  most         recent 09/27/2018. Dilated Cardiomyopathy, CAD; Risk         Factors:Hypertension, Sleep Apnea and HLD.    Sonographer:  Marygrace Drought RCS  Referring Phys: [place]    1. Mild to moderate global reduction in LV systolic function; mild LVH;  mild to moderate dilatation of aortic root (45 mm; suggest CTA or MRA to  further assess); mild AI, MR and TR.  2. Left ventricular ejection fraction, by estimation, is 40 to 45%. The  left ventricle has mild to moderately decreased function. The left  ventrical demonstrates global hypokinesis. There is mildly increased left  ventricular hypertrophy. Left  ventricular diastolic function could not be evaluated.  3. Right ventricular systolic function is normal. The right ventricular  size is normal.  4. The mitral valve is normal in structure and function. Mild mitral  valve regurgitation. No evidence of mitral stenosis.  5. The aortic valve is tricuspid. Aortic valve regurgitation is mild.  Mild to moderate aortic valve sclerosis/calcification is present, without  any evidence of aortic stenosis.  6. Aortic dilatation noted. There is mild to moderate dilatation of  the  aortic root measuring 45 mm.   FINDINGS  Left Ventricle: Left ventricular ejection fraction, by estimation, is 40  to 45%. The left ventricle has mild to moderately decreased function. The  left ventricle demonstrates global hypokinesis. The left ventricular  internal cavity size was normal in  size. There is mildly increased left ventricular hypertrophy. The  left  ventricular diastology could not be evaluated due to atrial fibrillation.   Right Ventricle: The right ventricular size is normal. Right ventricular  systolic function is normal.   Left Atrium: Left atrial size was normal in size.   Right Atrium: Right atrial size was normal in size.   Pericardium: There is no evidence of pericardial effusion.   Mitral Valve: The mitral valve is normal in structure and function. Normal  mobility of the mitral valve leaflets. Mild mitral annular calcification.  Mild mitral valve regurgitation. No evidence of mitral valve stenosis.   Tricuspid Valve: The tricuspid valve is normal in structure. Tricuspid  valve regurgitation is trivial. No evidence of tricuspid stenosis.   Aortic Valve: The aortic valve is tricuspid. Aortic valve regurgitation is  mild. Aortic regurgitation PHT measures 669 msec. Mild to moderate aortic  valve sclerosis/calcification is present, without any evidence of aortic  stenosis.   Pulmonic Valve: The pulmonic valve was not well visualized. Pulmonic valve  regurgitation is not visualized. No evidence of pulmonic stenosis.   Aorta: Aortic dilatation noted. There is mild to moderate dilatation of  the aortic root measuring 45 mm.   Venous: The inferior vena cava was not well visualized.   IAS/Shunts: No atrial level shunt detected by color flow Doppler.   Additional Comments: Mild to moderate global reduction in LV systolic  function; mild LVH; mild to moderate dilatation of aortic root (45 mm;  suggest CTA or MRA to further assess); mild AI, MR and TR.     LEFT VENTRICLE  PLAX 2D  LVIDd:     4.33 cm  LVIDs:     3.48 cm  LV PW:     1.30 cm  LV IVS:    1.17 cm  LVOT diam:   2.50 cm  LV SV Index:  13.62  LVOT Area:   4.91 cm     RIGHT VENTRICLE  RVSP:      30.5 mmHg   LEFT ATRIUM     Index   RIGHT ATRIUM  LA diam:  3.90 cm 1.62 cm/m RA Pressure: 3.00 mmHg  AORTIC  VALVE  AI PHT:   669 msec    AORTA  Ao Root diam: 4.50 cm   MITRAL VALVE            TRICUSPID VALVE  MV Area (PHT):           TR Peak grad:  27.5 mmHg  MV Decel Time:           TR Vmax:    262.00 cm/s  MV E velocity: 96.10 cm/s 103 cm/s Estimated RAP: 3.00 mmHg                   RVSP:      30.5 mmHg                     SHUNTS                   Systemic Diam: 2.50 cm   Kirk Ruths MD  Electronically signed by Kirk Ruths MD  Signature Date/Time: 09/27/2019/2:41:54 PM  Impression:  This 86 year old gentleman has a 4.6 cm fusiform ascending aortic aneurysm which is essentially unchanged dating back to 2016.  I reviewed his multiple CTA studies and echocardiograms since then and there have been varying measurements from 4.4 to 4.6 cm.  I do not think there has been any significant change.  He has a trileaflet aortic valve with mild regurgitation.  His aortic aneurysm is well below the surgical threshold of 5.5 cm.  I reviewed the CTA study with him and his wife and answered his questions.  I stressed the importance of continued good blood pressure control in preventing further enlargement and acute aortic dissection.  Given his age I think it is very unlikely that this will grow to 5.5 cm and I do not think he would be a surgical candidate anyway given his advanced age and limited mobility.  I discussed this with him and his wife and they understand and agree that he would not want to undergo surgery anyway.  I think it would be reasonable to check this every 2 years but I will leave that decision up to Dr. Radford Pax.  Plan:  He will continue to follow-up with Dr. Radford Pax and Dr. Felipa Eth.  I will be happy to see him back if the need arises.  I spent 30 minutes performing this consultation and > 50% of this time was spent face to face counseling and coordinating the care of this  patient's aortic root aneurysm.   Gaye Pollack, MD [facility] [phone]

## 2021-01-05 ENCOUNTER — Telehealth: Payer: Self-pay | Admitting: Dermatology

## 2021-01-05 MED ORDER — TRIAMCINOLONE ACETONIDE 0.1 % EX CREA
TOPICAL_CREAM | Freq: Two times a day (BID) | CUTANEOUS | 3 refills | Status: DC
Start: 2021-01-05 — End: 2021-06-09

## 2021-01-05 MED ORDER — KETOCONAZOLE 2 % EX CREA: TOPICAL_CREAM | Freq: Two times a day (BID) | CUTANEOUS | 3 refills | Status: DC

## 2021-01-05 NOTE — Telephone Encounter (Signed)
Pharmacy is calling to get refill on Ketoconazole 2% topical cream and Triamcinolone 0.1% cream.  (The pharmacy cannot fax at this time).

## 2021-01-13 DIAGNOSIS — E7800 Pure hypercholesterolemia, unspecified: Secondary | ICD-10-CM | POA: Diagnosis not present

## 2021-01-13 DIAGNOSIS — I480 Paroxysmal atrial fibrillation: Secondary | ICD-10-CM | POA: Diagnosis not present

## 2021-01-13 DIAGNOSIS — N1831 Chronic kidney disease, stage 3a: Secondary | ICD-10-CM | POA: Diagnosis not present

## 2021-01-13 DIAGNOSIS — G8929 Other chronic pain: Secondary | ICD-10-CM | POA: Diagnosis not present

## 2021-01-13 DIAGNOSIS — K219 Gastro-esophageal reflux disease without esophagitis: Secondary | ICD-10-CM | POA: Diagnosis not present

## 2021-01-13 DIAGNOSIS — I509 Heart failure, unspecified: Secondary | ICD-10-CM | POA: Diagnosis not present

## 2021-01-13 DIAGNOSIS — I129 Hypertensive chronic kidney disease with stage 1 through stage 4 chronic kidney disease, or unspecified chronic kidney disease: Secondary | ICD-10-CM | POA: Diagnosis not present

## 2021-01-28 DIAGNOSIS — N1831 Chronic kidney disease, stage 3a: Secondary | ICD-10-CM | POA: Diagnosis not present

## 2021-01-28 DIAGNOSIS — I509 Heart failure, unspecified: Secondary | ICD-10-CM | POA: Diagnosis not present

## 2021-01-28 DIAGNOSIS — D6869 Other thrombophilia: Secondary | ICD-10-CM | POA: Diagnosis not present

## 2021-01-28 DIAGNOSIS — I480 Paroxysmal atrial fibrillation: Secondary | ICD-10-CM | POA: Diagnosis not present

## 2021-01-28 DIAGNOSIS — I129 Hypertensive chronic kidney disease with stage 1 through stage 4 chronic kidney disease, or unspecified chronic kidney disease: Secondary | ICD-10-CM | POA: Diagnosis not present

## 2021-02-03 DIAGNOSIS — I509 Heart failure, unspecified: Secondary | ICD-10-CM | POA: Diagnosis not present

## 2021-02-03 DIAGNOSIS — E7800 Pure hypercholesterolemia, unspecified: Secondary | ICD-10-CM | POA: Diagnosis not present

## 2021-02-03 DIAGNOSIS — K219 Gastro-esophageal reflux disease without esophagitis: Secondary | ICD-10-CM | POA: Diagnosis not present

## 2021-02-03 DIAGNOSIS — G8929 Other chronic pain: Secondary | ICD-10-CM | POA: Diagnosis not present

## 2021-02-03 DIAGNOSIS — I129 Hypertensive chronic kidney disease with stage 1 through stage 4 chronic kidney disease, or unspecified chronic kidney disease: Secondary | ICD-10-CM | POA: Diagnosis not present

## 2021-02-03 DIAGNOSIS — I480 Paroxysmal atrial fibrillation: Secondary | ICD-10-CM | POA: Diagnosis not present

## 2021-02-03 DIAGNOSIS — N1831 Chronic kidney disease, stage 3a: Secondary | ICD-10-CM | POA: Diagnosis not present

## 2021-03-03 DIAGNOSIS — I480 Paroxysmal atrial fibrillation: Secondary | ICD-10-CM | POA: Diagnosis not present

## 2021-03-03 DIAGNOSIS — N1831 Chronic kidney disease, stage 3a: Secondary | ICD-10-CM | POA: Diagnosis not present

## 2021-03-03 DIAGNOSIS — G8929 Other chronic pain: Secondary | ICD-10-CM | POA: Diagnosis not present

## 2021-03-03 DIAGNOSIS — E7800 Pure hypercholesterolemia, unspecified: Secondary | ICD-10-CM | POA: Diagnosis not present

## 2021-03-03 DIAGNOSIS — I509 Heart failure, unspecified: Secondary | ICD-10-CM | POA: Diagnosis not present

## 2021-03-03 DIAGNOSIS — I129 Hypertensive chronic kidney disease with stage 1 through stage 4 chronic kidney disease, or unspecified chronic kidney disease: Secondary | ICD-10-CM | POA: Diagnosis not present

## 2021-03-03 DIAGNOSIS — K219 Gastro-esophageal reflux disease without esophagitis: Secondary | ICD-10-CM | POA: Diagnosis not present

## 2021-04-06 ENCOUNTER — Other Ambulatory Visit: Payer: Self-pay | Admitting: *Deleted

## 2021-04-06 DIAGNOSIS — G8929 Other chronic pain: Secondary | ICD-10-CM | POA: Diagnosis not present

## 2021-04-06 DIAGNOSIS — N1831 Chronic kidney disease, stage 3a: Secondary | ICD-10-CM | POA: Diagnosis not present

## 2021-04-06 DIAGNOSIS — I509 Heart failure, unspecified: Secondary | ICD-10-CM | POA: Diagnosis not present

## 2021-04-06 DIAGNOSIS — I129 Hypertensive chronic kidney disease with stage 1 through stage 4 chronic kidney disease, or unspecified chronic kidney disease: Secondary | ICD-10-CM | POA: Diagnosis not present

## 2021-04-06 DIAGNOSIS — K219 Gastro-esophageal reflux disease without esophagitis: Secondary | ICD-10-CM | POA: Diagnosis not present

## 2021-04-06 DIAGNOSIS — E7800 Pure hypercholesterolemia, unspecified: Secondary | ICD-10-CM | POA: Diagnosis not present

## 2021-04-06 DIAGNOSIS — I480 Paroxysmal atrial fibrillation: Secondary | ICD-10-CM | POA: Diagnosis not present

## 2021-04-06 MED ORDER — TORSEMIDE 20 MG PO TABS: ORAL_TABLET | ORAL | 1 refills | Status: DC

## 2021-04-26 ENCOUNTER — Other Ambulatory Visit: Payer: Self-pay | Admitting: Cardiology

## 2021-05-04 ENCOUNTER — Other Ambulatory Visit: Payer: Self-pay

## 2021-05-04 ENCOUNTER — Encounter: Payer: Self-pay | Admitting: Cardiology

## 2021-05-04 ENCOUNTER — Ambulatory Visit: Payer: PPO | Admitting: Cardiology

## 2021-05-04 VITALS — BP 112/72 | HR 63 | Ht 72.0 in | Wt 254.4 lb

## 2021-05-04 DIAGNOSIS — I420 Dilated cardiomyopathy: Secondary | ICD-10-CM

## 2021-05-04 DIAGNOSIS — I10 Essential (primary) hypertension: Secondary | ICD-10-CM

## 2021-05-04 DIAGNOSIS — I480 Paroxysmal atrial fibrillation: Secondary | ICD-10-CM | POA: Diagnosis not present

## 2021-05-04 DIAGNOSIS — I25119 Atherosclerotic heart disease of native coronary artery with unspecified angina pectoris: Secondary | ICD-10-CM

## 2021-05-04 DIAGNOSIS — I4821 Permanent atrial fibrillation: Secondary | ICD-10-CM

## 2021-05-04 DIAGNOSIS — I77810 Thoracic aortic ectasia: Secondary | ICD-10-CM

## 2021-05-04 DIAGNOSIS — G8929 Other chronic pain: Secondary | ICD-10-CM | POA: Diagnosis not present

## 2021-05-04 DIAGNOSIS — E7800 Pure hypercholesterolemia, unspecified: Secondary | ICD-10-CM | POA: Diagnosis not present

## 2021-05-04 DIAGNOSIS — I5042 Chronic combined systolic (congestive) and diastolic (congestive) heart failure: Secondary | ICD-10-CM

## 2021-05-04 DIAGNOSIS — I509 Heart failure, unspecified: Secondary | ICD-10-CM | POA: Diagnosis not present

## 2021-05-04 DIAGNOSIS — E785 Hyperlipidemia, unspecified: Secondary | ICD-10-CM | POA: Diagnosis not present

## 2021-05-04 DIAGNOSIS — N1831 Chronic kidney disease, stage 3a: Secondary | ICD-10-CM | POA: Diagnosis not present

## 2021-05-04 DIAGNOSIS — G4733 Obstructive sleep apnea (adult) (pediatric): Secondary | ICD-10-CM | POA: Diagnosis not present

## 2021-05-04 DIAGNOSIS — K219 Gastro-esophageal reflux disease without esophagitis: Secondary | ICD-10-CM | POA: Diagnosis not present

## 2021-05-04 DIAGNOSIS — I129 Hypertensive chronic kidney disease with stage 1 through stage 4 chronic kidney disease, or unspecified chronic kidney disease: Secondary | ICD-10-CM | POA: Diagnosis not present

## 2021-05-04 NOTE — Patient Instructions (Signed)
Medication Instructions:  Your physician recommends that you continue on your current medications as directed. Please refer to the Current Medication list given to you today.  *If you need a refill on your cardiac medications before your next appointment, please call your pharmacy*   Lab Work: TODAY: CBC and BMET If you have labs (blood work) drawn today and your tests are completely normal, you will receive your results only by: [place] (if you have MyChart) OR A paper copy in the mail If you have any lab test that is abnormal or we need to change your treatment, we will call you to review the results.  Follow-Up: At [facility], you and your health needs are our priority.  As part of our continuing mission to provide you with exceptional heart care, we have created designated Provider Care Teams.  These Care Teams include your primary Cardiologist (physician) and Advanced Practice Providers (APPs -  Physician Assistants and Nurse Practitioners) who all work together to provide you with the care you need, when you need it.   Your next appointment:   6 month(s)  The format for your next appointment:   In Person  Provider:   You may see Fransico Him, MD or one of the following Advanced Practice Providers on your designated Care Team:   Melina Copa, PA-C Ermalinda Barrios, PA-C

## 2021-05-04 NOTE — Addendum Note (Signed)
Addended by: Antonieta Iba on: 05/04/2021 03:47 PM   Modules accepted: Orders

## 2021-05-04 NOTE — Progress Notes (Addendum)
Cardiology Office Note:    Date:  05/04/2021   ID:  [name], DOB [birth year], MRN [id number]  PCP:  [name] Manes, MD  Cardiologist:  [name] Him, MD    Referring MD: [name] Manes, MD   Chief Complaint  Patient presents with   Coronary Artery Disease   Hypertension   Atrial Fibrillation   Hyperlipidemia   Sleep Apnea   Congestive Heart Failure   Cardiomyopathy     History of Present Illness:    [name] is a 86 y.o. male with a hx of  PAF s/p DCCV 10/2014, dilated aortic root at (4.4 cm) and ascending aorta (4.0 cm) on most recent assessment. He has h/o systolic HF, ASCAD by cath with 30% LM at the ostium, 70-80% ostial D2, 30-40% prox LCX, 30-40% prox and 40% mid RCA and is on medical management.  He is on chronic anticoagulation therapy with Eliquis. He was previously on amiodarone for his afib, however this was discontinued 10/2016 due to intolerances (visual disturbances and bilateral hand numbness) and now has permanent atrial fibrillation.    2D echo 10/2017 showed a decline in EF with moderate reduced LVF with EF 35-40% and moderately dilated aortic root at 46 mm.  He underwent cath showing an occluded RPDA which filled via left to right collaterals, 85% chronic stenosis of oD2, 99% mid RCA, 30% proximal to mid LAD, 30% proximal to distal left circumflex and moderate LV dysfunction with EF 25-35%.  He underwent PCI with drug-eluting stent to the mid RCA.  He also was noted to have moderate pulmonary hypertension with an LVEDP of 29 and a pulmonary capillary wedge closure to 40.  It was felt that he likely had a combination of group 2 and group 3 pulmonary hypertension secondary to obstructive sleep apnea as well as congestive heart failure with pulmonary venous hypertension.  Repeat echo 09/2018 showed an improvement in LVF with E 40-45% with mild to moderate diffuse LV dysfunction and 57m aortic root at sinuses of Valsalva.  Chest CTA the year before showed 463mSinus  of Valsalva.    He is here today for followup and is doing well.  He denies any anginal chest pain or pressure,  PND, orthopnea, LE edema, dizziness, palpitations or syncope. He has chornic DOE going up stairs that is stable.  He is compliant with his meds and is tolerating meds with no SE.     He is doing well with his CPAP device and thinks that he has gotten used to it.  He tolerates the mask and feels the pressure is adequate.  Since going on CPAP he feels rested in the am and has no significant daytime sleepiness.  He has some mouth and has no nasal dryness or nasal congestion.  He does not think that he snores.     Past Medical History:  Diagnosis Date   Aortic aneurysm (HCC)    4670mt sinuses of Valsalva and 1m67mcending aorta by chest CTA 10/2019   Benign essential HTN 09/17/2014   CAD (coronary artery disease), native coronary artery 09/20/2014   30% LM at the ostium, 70-80% ostial D2, 30-40% prox LCX, 30-40% prox and 40% mid RCA and EF 40%. 11/07/17 DES to RCA    Chronic lower back pain    DCM (dilated cardiomyopathy) (HCC)[place]/2016   a.  EF 40%; b. Echo 5/16:  EF 40-45%, diff HK, inf AK, mild AI, MAC, severe LAE   Dilated aortic root (HCC)[place]/09/2014  75m at SInus of Valsalva on echo 09/2018   DJD (degenerative joint disease)    feet, knees and back   GERD (gastroesophageal reflux disease)    hiatal hernia   Hepatitis 1953   "A or B; not sure which"   High cholesterol    History of blood transfusion 1954   "related to hip OR"   History of hip surgery 1954   "knocked hips out of joints; had to have bone grafts; both sides"   Hyperlipidemia LDL goal <70 10/14/2015   Lesion of left femoral nerve    3 cm MRI 04/2010 consistent with hemangioma   Mild aortic insufficiency    Obesity    OSA (obstructive sleep apnea) 10/14/2015   Mild with AHI 10.6/hr overall and 38/hr during REM sleep.  On CPAP at 9cm H2O   OSA on CPAP    Osteoarthritis    "all over" (11/07/2017)   [place]  [place]   PSA (psoriatic arthritis) (HCC)    Mild increase 3.65 on 05/2011 stable 3.68 on 09/2011   Renal cyst, left 2010   5 cm seen on ultrasound     Past Surgical History:  Procedure Laterality Date   APPENDECTOMY     BACK SURGERY     CARDIOVERSION N/A 11/04/2014   Procedure: CARDIOVERSION;  Surgeon: TSueanne Margarita MD;  [facility];  Service: Cardiovascular;  Laterality: N/A;   CARDIOVERSION N/A 01/09/2015   Procedure: CARDIOVERSION;  Surgeon: KPixie Casino MD;  [facility];  Service: Cardiovascular;  Laterality: N/A;   CATARACT EXTRACTION W/ INTRAOCULAR LENS  IMPLANT, BILATERAL Bilateral    CORONARY STENT INTERVENTION N/A 11/07/2017   Procedure: CORONARY STENT INTERVENTION;  Surgeon: HLeonie Man MD;  [facility];  Service: Cardiovascular;  Laterality: N/A;   HERNIA REPAIR     JOINT REPLACEMENT     LAPAROSCOPIC CHOLECYSTECTOMY     LEFT HEART CATHETERIZATION WITH CORONARY ANGIOGRAM N/A 10/01/2014   Procedure: LEFT HEART CATHETERIZATION WITH CORONARY ANGIOGRAM;  Surgeon: TTroy Sine MD;  [facility];  Service: Cardiovascular;  Laterality: N/A;   LUMBAR LAMINECTOMY     REVISION TOTAL HIP ARTHROPLASTY Right    RIGHT/LEFT HEART CATH AND CORONARY ANGIOGRAPHY N/A 11/07/2017   Procedure: RIGHT/LEFT HEART CATH AND CORONARY ANGIOGRAPHY;  Surgeon: HLeonie Man MD;  [facility];  Service: Cardiovascular;  Laterality: N/A;   SHOULDER OPEN ROTATOR CUFF REPAIR Bilateral    TONSILLECTOMY     TOTAL HIP ARTHROPLASTY Bilateral    TOTAL KNEE ARTHROPLASTY Bilateral    UMBILICAL HERNIA REPAIR      Current Medications: Current Meds  Medication Sig   acetaminophen (TYLENOL) 650 MG CR tablet Take 650 mg by mouth every 8 (eight) hours as needed for pain.   apixaban (ELIQUIS) 2.5 MG TABS tablet Take 1 tablet (2.5 mg total) by mouth 2 (two) times daily.   clobetasol (TEMOVATE) 0.05 %  external solution Apply 1 application topically 2 (two) times daily.   clopidogrel (PLAVIX) 75 MG tablet TAKE ONE TABLET BY MOUTH EVERY MORNING   ezetimibe (ZETIA) 10 MG tablet Take 1 tablet (10 mg total) by mouth daily.   ketoconazole (NIZORAL) 2 % cream Apply topically 2 (two) times daily.   meloxicam (MOBIC) 7.5 MG tablet Take 7.5 mg by mouth daily.   metoprolol succinate (TOPROL-XL) 25 MG 24 hr tablet Take 1 tablet (25 mg total) by mouth daily. Please keep upcoming appt. In  order to keep future refills.   MULTIPLE VITAMINS PO Take 1 tablet by mouth daily.   pantoprazole (PROTONIX) 40 MG tablet Take 1 tablet (40 mg total) by mouth daily.   prednisoLONE acetate (PRED FORTE) 1 % ophthalmic suspension    rosuvastatin (CRESTOR) 40 MG tablet TAKE ONE TABLET BY MOUTH DAILY   sacubitril-valsartan (ENTRESTO) 49-51 MG Take 1 tablet by mouth 2 (two) times daily.   spironolactone (ALDACTONE) 25 MG tablet Take 0.5 tablets (12.5 mg total) by mouth daily.   torsemide (DEMADEX) 20 MG tablet Take 1 tablet (20 mg total) as needed for weight gain of 3 lbs in one day or 5 lbs in one week.   traMADol (ULTRAM) 50 MG tablet Take 50 mg by mouth every 6 (six) hours as needed for moderate pain.   triamcinolone cream (KENALOG) 0.1 % Apply topically 2 (two) times daily.     Allergies:   Simvastatin, Codeine phosphate, and Penicillins   Social History   Socioeconomic History   Marital status: Married    Spouse name: Not on file   Number of children: 4   Years of education: Not on file   Highest education level: Not on file  Occupational History   Occupation: Naval architect  Tobacco Use   Smoking status: Former    Packs/day: 1.00    Years: 30.00    Pack years: 30.00    Types: Cigarettes   Smokeless tobacco: Never   Tobacco comments:    "quit smoking in the 1980s"  Vaping Use   Vaping Use: Never used  Substance and Sexual Activity   Alcohol use: No   Drug use: No   Sexual activity: Yes  Other  Topics Concern   Not on file  Social History Narrative   Not on file   Social Determinants of Health   Financial Resource Strain: Not on file  Food Insecurity: Not on file  Transportation Needs: Not on file  Physical Activity: Not on file  Stress: Not on file  Social Connections: Not on file     Family History: The patient's family history includes Atrial fibrillation in his sister; Cancer in his mother; Diabetes in his father; Heart attack in his maternal grandmother, paternal grandfather, and paternal grandmother; Heart disease in his sister; Stroke in his maternal grandfather.  ROS:   Please see the history of present illness.    ROS  All other systems reviewed and negative.   EKGs/Labs/Other Studies Reviewed:    The following studies were reviewed today: none  EKG:  EKG is not ordered today.   Recent Labs: No results found for requested labs within last 8760 hours.   Recent Lipid Panel    Component Value Date/Time   CHOL 149 10/20/2017 1220   TRIG 96 10/20/2017 1220   HDL 49 10/20/2017 1220   CHOLHDL 3.0 10/20/2017 1220   CHOLHDL 2.5 05/10/2016 1145   VLDL 19 05/10/2016 1145   LDLCALC 81 10/20/2017 1220    Physical Exam:    VS:  BP 112/72   Pulse 63   Ht 6' (1.829 m)   Wt 254 lb 6.4 oz (115.4 kg)   SpO2 98%   BMI 34.50 kg/m     Wt Readings from Last 3 Encounters:  05/04/21 254 lb 6.4 oz (115.4 kg)  12/15/20 242 lb (109.8 kg)  09/24/20 257 lb 12.8 oz (116.9 kg)    GEN: Well nourished, well developed in no acute distress HEENT: Normal NECK: No JVD; No carotid bruits  LYMPHATICS: No lymphadenopathy CARDIAC:irregularly irregular, no murmurs, rubs, gallops RESPIRATORY:  Clear to auscultation without rales, wheezing or rhonchi  ABDOMEN: Soft, non-tender, non-distended MUSCULOSKELETAL:  No edema; No deformity  SKIN: Warm and dry NEUROLOGIC:  Alert and oriented x 3 PSYCHIATRIC:  Normal affect   ASSESSMENT:    1. Permanent atrial fibrillation ([place])    2. DCM (dilated cardiomyopathy) ([place])   3. Coronary artery disease involving native coronary artery of native heart with angina pectoris ([place])   4. Chronic combined systolic and diastolic CHF (congestive heart failure) ([place])   5. Dilated aortic root ([place])   6. Benign essential HTN   7. OSA (obstructive sleep apnea)   8. Hyperlipidemia LDL goal <70    PLAN:    In order of problems listed above:  1.  Permanent atrial fibrillation -he denies any palpitations and HR is well controlled on exam -denies any bleeding problems on DOAC -Continue prescription drug management with Apixaban 2.52m BID (dosed for age>80 and SCr>15.) and Toprol XL 265mdaily>refilled -check BMET today and CBC  2.  Ischemic DCM -echo 2019 showed decline in EF to 35-40% prompting cath -Repeat heart cath showed an ostial D2 85% lesion, occluded RPDA, 99% mid RCA and he underwent PCI DES to the mid RCA in March 2019.   -repeat 2D echo 09/2018 showed improvement in LVF with EF 40-45% -he appears euvolemic on exam today and weight is stable -Continue prescription drug management with Entresto 49-5165mID, Spiro 12.5mg8mily, Torsemide 20mg32mly PRN and Toprol XL 50mg 65my >refilled -Check BMET   3.  ASCAD  -Cath showed an occluded RPDA which filled via left to right collaterals, 85% chronic stenosis of ostial D2, 99% mid RCA, 30% proximal to mid LAD, 30% proximal to distal left circumflex and moderate LV dysfunction with EF 25-35% echo showed EF 35 to 40%. -s/p PCI with drug-eluting stent to the mid RCA.     -he denies any anginal symptoms -no ASA due to DOAC -continue prescription drug management with Plavix 75mg d61m, BB and statin>refilled plavix   4.  Chronic combined systolic/diastolic CHF  -he does no appear volume overloaded on exam today -Echo 10/20/2017 showed EF 35 to 40% and improvement to 40-45% on echo 09/2018.  -Continue prescription drug management with Torsemide 20mg da71mPRN, Entresto 49-51mg BID74moprol XL 25mg dail64md Spiro 12.5mg daily>73milled   5.  Dilated aortic root -dilated at sinuses of Valsalva at 46mm by Che34mTA 10/2019 and 10/2020 -repeat chest CTA in 09/2022 to reassess -Continue statin -BP controlled   6.  HTN  -Bp adequately controlled on exam today -Continue on Toprol-XL 50 mg daily, Entresto and spiro   7.  OSA - The patient is tolerating PAP therapy well without any problems. The patient has been using and benefiting from PAP use and will continue to benefit from therapy.  -I will get a download from the DME   8.  Hyperlpidemia with LDL goal < 70.   -Continue prescription drug management with Crestor 40mg daily a99metia 10mg daily>re48med   -I have personally reviewed and interpreted outside labs performed by patient's PCP which showed LDL 51,HDL 40, TAG 108 and ALT 14 in Feb 2022    Medication Adjustments/Labs and Tests Ordered: Current medicines are reviewed at length with the patient today.  Concerns regarding medicines are outlined above.  No orders of the defined types were placed in this encounter.  No orders of the defined types were placed in  this encounter.   Signed, [name] Him, MD  05/04/2021 3:36 PM    [place]

## 2021-05-05 ENCOUNTER — Telehealth: Payer: Self-pay

## 2021-05-05 DIAGNOSIS — I10 Essential (primary) hypertension: Secondary | ICD-10-CM

## 2021-05-05 DIAGNOSIS — I4821 Permanent atrial fibrillation: Secondary | ICD-10-CM

## 2021-05-05 DIAGNOSIS — I77810 Thoracic aortic ectasia: Secondary | ICD-10-CM

## 2021-05-05 LAB — CBC
Hematocrit: 48.4 % (ref 37.5–51.0)
Hemoglobin: 16 g/dL (ref 13.0–17.7)
MCH: 27.7 pg (ref 26.6–33.0)
MCHC: 33.1 g/dL (ref 31.5–35.7)
MCV: 84 fL (ref 79–97)
Platelets: 196 10*3/uL (ref 150–450)
RBC: 5.78 x10E6/uL (ref 4.14–5.80)
RDW: 14.5 % (ref 11.6–15.4)
WBC: 9.9 10*3/uL (ref 3.4–10.8)

## 2021-05-05 LAB — BASIC METABOLIC PANEL
BUN/Creatinine Ratio: 23 (ref 10–24)
BUN: 36 mg/dL — ABNORMAL HIGH (ref 8–27)
CO2: 23 mmol/L (ref 20–29)
Calcium: 10.3 mg/dL — ABNORMAL HIGH (ref 8.6–10.2)
Chloride: 101 mmol/L (ref 96–106)
Creatinine, Ser: 1.55 mg/dL — ABNORMAL HIGH (ref 0.76–1.27)
Glucose: 84 mg/dL (ref 65–99)
Potassium: 5.4 mmol/L — ABNORMAL HIGH (ref 3.5–5.2)
Sodium: 139 mmol/L (ref 134–144)
eGFR: 44 mL/min/{1.73_m2} — ABNORMAL LOW (ref >59)

## 2021-05-05 NOTE — Telephone Encounter (Signed)
-----   Message from Sueanne Margarita, MD sent at 05/05/2021 11:36 AM EDT ----- Stop spiro and repeat BMET on Friday - avoid high potassium foods ----- Message ----- From: Antonieta Iba, RN Sent: 05/05/2021  11:06 AM EDT To: Sueanne Margarita, MD  The patient has been notified of the result and verbalized understanding.  All questions (if any) were answered. Antonieta Iba, RN 05/05/2021 11:01 AM   Patient takes a multivitamin gummy very rarely (1-2 times per month). He does not take any supplements or OTC potassium.  He reports that he is not able to function without Mobic.

## 2021-05-05 NOTE — Telephone Encounter (Signed)
Spoke with the patient and have educated on high potassium foods to avoid. He will discontinue spironolactone and repeat BMET on Friday.

## 2021-05-08 ENCOUNTER — Other Ambulatory Visit: Payer: PPO | Admitting: *Deleted

## 2021-05-08 ENCOUNTER — Other Ambulatory Visit: Payer: Self-pay

## 2021-05-08 DIAGNOSIS — I4821 Permanent atrial fibrillation: Secondary | ICD-10-CM | POA: Diagnosis not present

## 2021-05-08 DIAGNOSIS — I77810 Thoracic aortic ectasia: Secondary | ICD-10-CM

## 2021-05-08 DIAGNOSIS — I10 Essential (primary) hypertension: Secondary | ICD-10-CM

## 2021-05-09 LAB — BASIC METABOLIC PANEL
BUN/Creatinine Ratio: 25 — ABNORMAL HIGH (ref 10–24)
BUN: 33 mg/dL — ABNORMAL HIGH (ref 8–27)
CO2: 20 mmol/L (ref 20–29)
Calcium: 9.3 mg/dL (ref 8.6–10.2)
Chloride: 101 mmol/L (ref 96–106)
Creatinine, Ser: 1.34 mg/dL — ABNORMAL HIGH (ref 0.76–1.27)
Glucose: 98 mg/dL (ref 65–99)
Potassium: 5.2 mmol/L (ref 3.5–5.2)
Sodium: 138 mmol/L (ref 134–144)
eGFR: 53 mL/min/{1.73_m2} — ABNORMAL LOW (ref >59)

## 2021-05-15 DIAGNOSIS — Z23 Encounter for immunization: Secondary | ICD-10-CM | POA: Diagnosis not present

## 2021-05-15 DIAGNOSIS — N1831 Chronic kidney disease, stage 3a: Secondary | ICD-10-CM | POA: Diagnosis not present

## 2021-05-15 DIAGNOSIS — K5901 Slow transit constipation: Secondary | ICD-10-CM | POA: Diagnosis not present

## 2021-05-15 DIAGNOSIS — I129 Hypertensive chronic kidney disease with stage 1 through stage 4 chronic kidney disease, or unspecified chronic kidney disease: Secondary | ICD-10-CM | POA: Diagnosis not present

## 2021-06-09 ENCOUNTER — Other Ambulatory Visit: Payer: Self-pay | Admitting: Dermatology

## 2021-06-11 DIAGNOSIS — I480 Paroxysmal atrial fibrillation: Secondary | ICD-10-CM | POA: Diagnosis not present

## 2021-06-11 DIAGNOSIS — N1831 Chronic kidney disease, stage 3a: Secondary | ICD-10-CM | POA: Diagnosis not present

## 2021-06-11 DIAGNOSIS — G8929 Other chronic pain: Secondary | ICD-10-CM | POA: Diagnosis not present

## 2021-06-11 DIAGNOSIS — E7800 Pure hypercholesterolemia, unspecified: Secondary | ICD-10-CM | POA: Diagnosis not present

## 2021-06-11 DIAGNOSIS — I509 Heart failure, unspecified: Secondary | ICD-10-CM | POA: Diagnosis not present

## 2021-06-11 DIAGNOSIS — K219 Gastro-esophageal reflux disease without esophagitis: Secondary | ICD-10-CM | POA: Diagnosis not present

## 2021-06-11 DIAGNOSIS — I129 Hypertensive chronic kidney disease with stage 1 through stage 4 chronic kidney disease, or unspecified chronic kidney disease: Secondary | ICD-10-CM | POA: Diagnosis not present

## 2021-06-19 ENCOUNTER — Telehealth: Payer: Self-pay | Admitting: Cardiology

## 2021-06-19 NOTE — Telephone Encounter (Signed)
Notified Kaitlyn that the medication was discontinued by Dr. Radford Pax for elevated potassium levels in lab work on 05/05/21. Verbalized understanding.

## 2021-06-19 NOTE — Telephone Encounter (Signed)
Pt c/o medication issue:  1. Name of Medication: Spironolactone    2. How are you currently taking this medication (dosage and times per day)?   3. Are you having a reaction (difficulty breathing--STAT)?   4. What is your medication issue? Kaitlyn from Dr. Carlyle Lipa office was calling to see if the patient is still taking this medication. Please advise

## 2021-06-24 ENCOUNTER — Other Ambulatory Visit: Payer: Self-pay | Admitting: Cardiology

## 2021-06-30 DIAGNOSIS — I129 Hypertensive chronic kidney disease with stage 1 through stage 4 chronic kidney disease, or unspecified chronic kidney disease: Secondary | ICD-10-CM | POA: Diagnosis not present

## 2021-06-30 DIAGNOSIS — K219 Gastro-esophageal reflux disease without esophagitis: Secondary | ICD-10-CM | POA: Diagnosis not present

## 2021-06-30 DIAGNOSIS — N1831 Chronic kidney disease, stage 3a: Secondary | ICD-10-CM | POA: Diagnosis not present

## 2021-06-30 DIAGNOSIS — G8929 Other chronic pain: Secondary | ICD-10-CM | POA: Diagnosis not present

## 2021-06-30 DIAGNOSIS — I480 Paroxysmal atrial fibrillation: Secondary | ICD-10-CM | POA: Diagnosis not present

## 2021-06-30 DIAGNOSIS — E7800 Pure hypercholesterolemia, unspecified: Secondary | ICD-10-CM | POA: Diagnosis not present

## 2021-06-30 DIAGNOSIS — I509 Heart failure, unspecified: Secondary | ICD-10-CM | POA: Diagnosis not present

## 2021-07-01 DIAGNOSIS — I509 Heart failure, unspecified: Secondary | ICD-10-CM | POA: Diagnosis not present

## 2021-07-01 DIAGNOSIS — I480 Paroxysmal atrial fibrillation: Secondary | ICD-10-CM | POA: Diagnosis not present

## 2021-07-01 DIAGNOSIS — D6869 Other thrombophilia: Secondary | ICD-10-CM | POA: Diagnosis not present

## 2021-07-01 DIAGNOSIS — I129 Hypertensive chronic kidney disease with stage 1 through stage 4 chronic kidney disease, or unspecified chronic kidney disease: Secondary | ICD-10-CM | POA: Diagnosis not present

## 2021-07-01 DIAGNOSIS — N1831 Chronic kidney disease, stage 3a: Secondary | ICD-10-CM | POA: Diagnosis not present

## 2021-07-24 DIAGNOSIS — Z8719 Personal history of other diseases of the digestive system: Secondary | ICD-10-CM | POA: Diagnosis not present

## 2021-07-24 DIAGNOSIS — R42 Dizziness and giddiness: Secondary | ICD-10-CM | POA: Diagnosis not present

## 2021-07-27 DIAGNOSIS — R0981 Nasal congestion: Secondary | ICD-10-CM | POA: Diagnosis not present

## 2021-07-27 DIAGNOSIS — R42 Dizziness and giddiness: Secondary | ICD-10-CM | POA: Diagnosis not present

## 2021-07-29 DIAGNOSIS — K219 Gastro-esophageal reflux disease without esophagitis: Secondary | ICD-10-CM | POA: Diagnosis not present

## 2021-07-29 DIAGNOSIS — I480 Paroxysmal atrial fibrillation: Secondary | ICD-10-CM | POA: Diagnosis not present

## 2021-07-29 DIAGNOSIS — I129 Hypertensive chronic kidney disease with stage 1 through stage 4 chronic kidney disease, or unspecified chronic kidney disease: Secondary | ICD-10-CM | POA: Diagnosis not present

## 2021-07-29 DIAGNOSIS — G8929 Other chronic pain: Secondary | ICD-10-CM | POA: Diagnosis not present

## 2021-07-29 DIAGNOSIS — E7800 Pure hypercholesterolemia, unspecified: Secondary | ICD-10-CM | POA: Diagnosis not present

## 2021-07-29 DIAGNOSIS — N1831 Chronic kidney disease, stage 3a: Secondary | ICD-10-CM | POA: Diagnosis not present

## 2021-07-29 DIAGNOSIS — I509 Heart failure, unspecified: Secondary | ICD-10-CM | POA: Diagnosis not present

## 2021-07-31 DIAGNOSIS — R42 Dizziness and giddiness: Secondary | ICD-10-CM | POA: Diagnosis not present

## 2021-08-21 DIAGNOSIS — G4733 Obstructive sleep apnea (adult) (pediatric): Secondary | ICD-10-CM | POA: Diagnosis not present

## 2021-09-01 DIAGNOSIS — G8929 Other chronic pain: Secondary | ICD-10-CM | POA: Diagnosis not present

## 2021-09-01 DIAGNOSIS — I509 Heart failure, unspecified: Secondary | ICD-10-CM | POA: Diagnosis not present

## 2021-09-01 DIAGNOSIS — N1831 Chronic kidney disease, stage 3a: Secondary | ICD-10-CM | POA: Diagnosis not present

## 2021-09-01 DIAGNOSIS — I129 Hypertensive chronic kidney disease with stage 1 through stage 4 chronic kidney disease, or unspecified chronic kidney disease: Secondary | ICD-10-CM | POA: Diagnosis not present

## 2021-09-01 DIAGNOSIS — K219 Gastro-esophageal reflux disease without esophagitis: Secondary | ICD-10-CM | POA: Diagnosis not present

## 2021-09-01 DIAGNOSIS — E7800 Pure hypercholesterolemia, unspecified: Secondary | ICD-10-CM | POA: Diagnosis not present

## 2021-09-01 DIAGNOSIS — I480 Paroxysmal atrial fibrillation: Secondary | ICD-10-CM | POA: Diagnosis not present

## 2021-09-28 DIAGNOSIS — I129 Hypertensive chronic kidney disease with stage 1 through stage 4 chronic kidney disease, or unspecified chronic kidney disease: Secondary | ICD-10-CM | POA: Diagnosis not present

## 2021-09-28 DIAGNOSIS — I480 Paroxysmal atrial fibrillation: Secondary | ICD-10-CM | POA: Diagnosis not present

## 2021-09-28 DIAGNOSIS — E7800 Pure hypercholesterolemia, unspecified: Secondary | ICD-10-CM | POA: Diagnosis not present

## 2021-09-28 DIAGNOSIS — I509 Heart failure, unspecified: Secondary | ICD-10-CM | POA: Diagnosis not present

## 2021-10-06 DIAGNOSIS — D6869 Other thrombophilia: Secondary | ICD-10-CM | POA: Diagnosis not present

## 2021-10-06 DIAGNOSIS — I700 Atherosclerosis of aorta: Secondary | ICD-10-CM | POA: Diagnosis not present

## 2021-10-06 DIAGNOSIS — K219 Gastro-esophageal reflux disease without esophagitis: Secondary | ICD-10-CM | POA: Diagnosis not present

## 2021-10-06 DIAGNOSIS — I509 Heart failure, unspecified: Secondary | ICD-10-CM | POA: Diagnosis not present

## 2021-10-06 DIAGNOSIS — N401 Enlarged prostate with lower urinary tract symptoms: Secondary | ICD-10-CM | POA: Diagnosis not present

## 2021-10-06 DIAGNOSIS — K9089 Other intestinal malabsorption: Secondary | ICD-10-CM | POA: Diagnosis not present

## 2021-10-06 DIAGNOSIS — Z0000 Encounter for general adult medical examination without abnormal findings: Secondary | ICD-10-CM | POA: Diagnosis not present

## 2021-10-06 DIAGNOSIS — I7122 Aneurysm of the aortic arch, without rupture: Secondary | ICD-10-CM | POA: Diagnosis not present

## 2021-10-06 DIAGNOSIS — Z1389 Encounter for screening for other disorder: Secondary | ICD-10-CM | POA: Diagnosis not present

## 2021-10-06 DIAGNOSIS — I480 Paroxysmal atrial fibrillation: Secondary | ICD-10-CM | POA: Diagnosis not present

## 2021-10-06 DIAGNOSIS — E7800 Pure hypercholesterolemia, unspecified: Secondary | ICD-10-CM | POA: Diagnosis not present

## 2021-10-06 DIAGNOSIS — Z79899 Other long term (current) drug therapy: Secondary | ICD-10-CM | POA: Diagnosis not present

## 2021-10-13 DIAGNOSIS — M545 Low back pain, unspecified: Secondary | ICD-10-CM | POA: Diagnosis not present

## 2021-10-27 ENCOUNTER — Other Ambulatory Visit: Payer: Self-pay | Admitting: Cardiology

## 2021-10-27 ENCOUNTER — Telehealth: Payer: Self-pay

## 2021-10-27 DIAGNOSIS — I509 Heart failure, unspecified: Secondary | ICD-10-CM | POA: Diagnosis not present

## 2021-10-27 DIAGNOSIS — I480 Paroxysmal atrial fibrillation: Secondary | ICD-10-CM | POA: Diagnosis not present

## 2021-10-27 DIAGNOSIS — G8929 Other chronic pain: Secondary | ICD-10-CM | POA: Diagnosis not present

## 2021-10-27 DIAGNOSIS — E7800 Pure hypercholesterolemia, unspecified: Secondary | ICD-10-CM | POA: Diagnosis not present

## 2021-10-27 MED ORDER — APIXABAN 5 MG PO TABS: 5.0000 mg | ORAL_TABLET | Freq: Two times a day (BID) | ORAL | 1 refills | Status: DC

## 2021-10-27 NOTE — Telephone Encounter (Signed)
Eliquis 2.'5mg'$  refill has been sent in prior to receiving response from Dr. Radford Pax. Called [facility] to advise to place rx on hold as we are waiting a response from Dr. Radford Pax if the dose needs to be increased to '5mg'$  or remain on 2.'5mg'$ . Haley at the pharmacy will place refill on hold and await for [place] to call back with an update.  ?

## 2021-10-27 NOTE — Telephone Encounter (Signed)
-----   Message from Sueanne Margarita, MD sent at 10/27/2021  2:12 PM EDT ----- ?Yes would increase Eliquis to '5mg'$  BID ?----- Message ----- ?From: Brynda Peon, RN ?Sent: 10/27/2021   8:52 AM EDT ?To: Sueanne Margarita, MD ? ?Pt last saw Dr Radford Pax 05/04/21, last labs 10/06/21 Creat 1.38 at [facility] per [place], age 87, weight 115.4kg, based on specified criteria pt is not on appropriate dosage of Eliquis. ? ?Historically pt's Creat has fluctuated 10/06/21 Creat 1.38, 05/08/21 Creat 1.34, 05/04/21 Creat 1.55, 03/24/20 Creat 1.36, 03/13/20 Creat 1.61.  ? ?Please advise if dosage change to Eliquis '5mg'$  BID is appropriate. Thanks ? ? ?

## 2021-10-27 NOTE — Telephone Encounter (Signed)
See telephone note in [place] 10/27/21 with new Eliquis rx.  Per Dr Radford Pax will increase Eliquis dosage to '5mg'$  BID.  Rx sent electronically to [facility], called pharmacy cancelled spoke with Hildred Alamin advised to discontinue Eliquis 2.'5mg'$  tablets. ?  ?Called spoke with pt advised of Eliquis dosage change, pt is appreciative of the phone call.   ?

## 2021-10-27 NOTE — Telephone Encounter (Signed)
Pt last saw Dr Radford Pax 05/04/21, last labs 10/06/21 Creat 1.38 at [facility] per [place], age 87, weight 115.4kg, based on specified criteria pt is not on appropriate dosage of Eliquis. ? ?Historically pt's Creat has fluctuated 10/06/21 Creat 1.38, 05/08/21 Creat 1.34, 05/04/21 Creat 1.55, 03/24/20 Creat 1.36, 03/13/20 Creat 1.61.  ? ?Will forward message to Dr Radford Pax to see if dosage change appropriate.  Will await response.  ?

## 2021-10-27 NOTE — Telephone Encounter (Signed)
Per Dr Radford Pax will increase Eliquis dosage to '5mg'$  BID.  Rx sent electronically to [facility], called pharmacy cancelled spoke with Hildred Alamin advised to discontinue Eliquis 2.'5mg'$  tablets. ? ?Called spoke with pt advised of Eliquis dosage change, pt is appreciative of the phone call.   ?

## 2021-11-04 ENCOUNTER — Ambulatory Visit: Payer: PPO | Admitting: Cardiology

## 2021-11-04 ENCOUNTER — Encounter: Payer: Self-pay | Admitting: Cardiology

## 2021-11-04 ENCOUNTER — Other Ambulatory Visit: Payer: Self-pay

## 2021-11-04 VITALS — BP 118/66 | HR 45 | Ht 72.0 in | Wt 252.4 lb

## 2021-11-04 DIAGNOSIS — I25119 Atherosclerotic heart disease of native coronary artery with unspecified angina pectoris: Secondary | ICD-10-CM

## 2021-11-04 DIAGNOSIS — I10 Essential (primary) hypertension: Secondary | ICD-10-CM | POA: Diagnosis not present

## 2021-11-04 DIAGNOSIS — G4733 Obstructive sleep apnea (adult) (pediatric): Secondary | ICD-10-CM

## 2021-11-04 DIAGNOSIS — I77810 Thoracic aortic ectasia: Secondary | ICD-10-CM

## 2021-11-04 DIAGNOSIS — E785 Hyperlipidemia, unspecified: Secondary | ICD-10-CM

## 2021-11-04 DIAGNOSIS — I5042 Chronic combined systolic (congestive) and diastolic (congestive) heart failure: Secondary | ICD-10-CM | POA: Diagnosis not present

## 2021-11-04 DIAGNOSIS — I420 Dilated cardiomyopathy: Secondary | ICD-10-CM | POA: Diagnosis not present

## 2021-11-04 DIAGNOSIS — I4819 Other persistent atrial fibrillation: Secondary | ICD-10-CM

## 2021-11-04 NOTE — Patient Instructions (Signed)
Medication Instructions:  ?Your physician recommends that you continue on your current medications as directed. Please refer to the Current Medication list given to you today. ? ?*If you need a refill on your cardiac medications before your next appointment, please call your pharmacy* ? ?Follow-Up: ?At [facility], you and your health needs are our priority.  As part of our continuing mission to provide you with exceptional heart care, we have created designated Provider Care Teams.  These Care Teams include your primary Cardiologist (physician) and Advanced Practice Providers (APPs -  Physician Assistants and Nurse Practitioners) who all work together to provide you with the care you need, when you need it.  ? ?Your next appointment:   ?6 week(s) after receiving your new device ? ?The format for your next appointment:   ?In Person or Virtual ? ?Provider:   ?Fransico Him, MD   ? ? ?Other Instructions ?Call us when you receive your new device so that we can schedule you for your follow up appointment.   ?
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
## 2021-11-06 IMAGING — CT CT ANGIO CHEST
2 of 8 series · 16 of 46 positions shown · IV contrast (OMNIPAQUE 350)
Comparison: 09/30/2017

CLINICAL DATA: Thoracic aortic aneurysm, follow-up examination

EXAM:
CT ANGIOGRAPHY CHEST WITH CONTRAST
TECHNIQUE: Multidetector CT imaging of the chest was performed using the
standard protocol during bolus administration of intravenous
contrast. Multiplanar CT image reconstructions and MIPs were
obtained to evaluate the vascular anatomy.
CONTRAST:  75mL OMNIPAQUE IOHEXOL 350 MG/ML SOLN

[Series 4: aorta 3.0 bf37 2 · axial · 0.77mm/px · z∈[-482,-230]mm · 13 of 99 slices shown]
[im 8/99  lung]
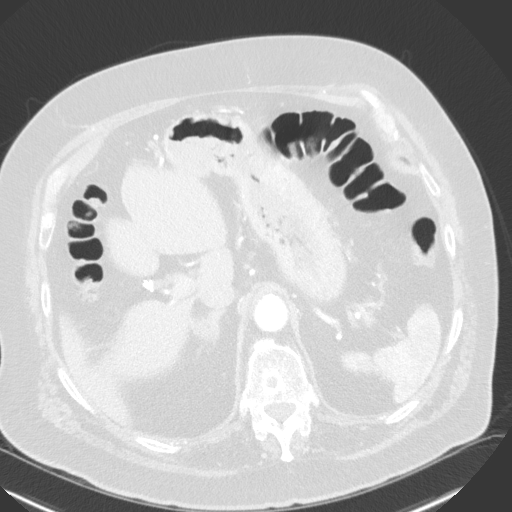
[im 15/99  soft-tissue]
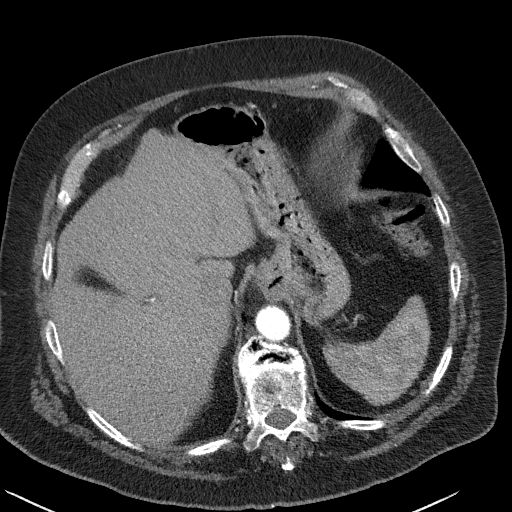
[im 22/99  lung]
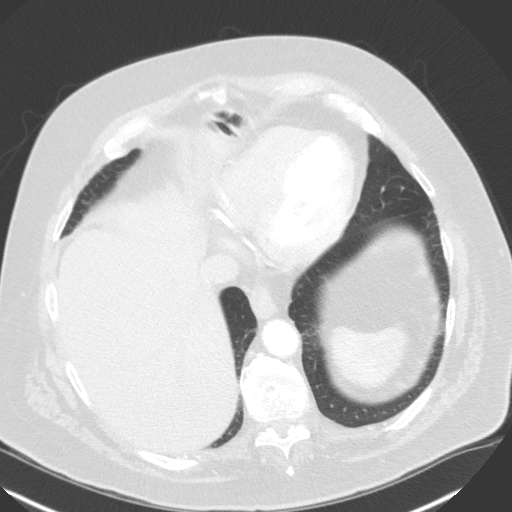
[im 29/99  soft-tissue]
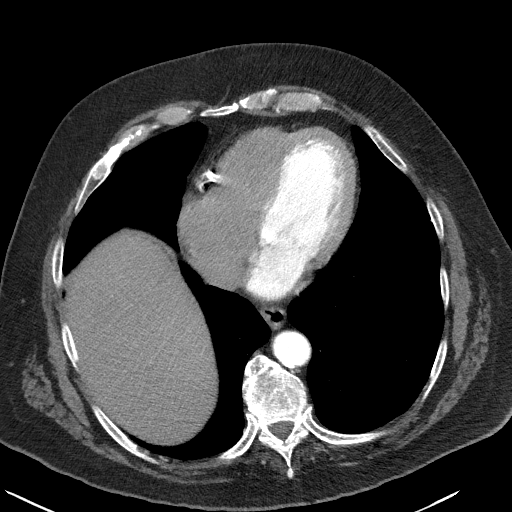
[im 36/99  lung]
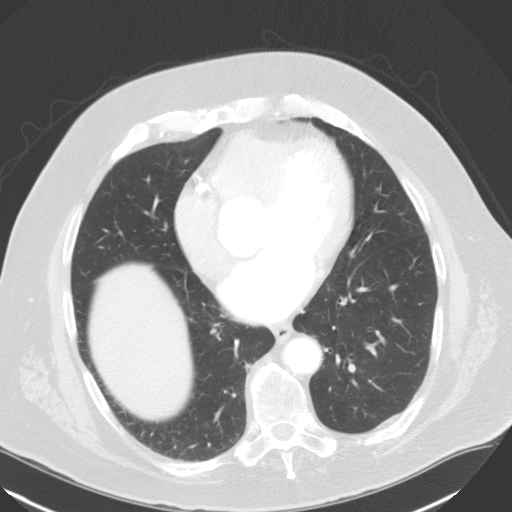
[im 43/99  soft-tissue]
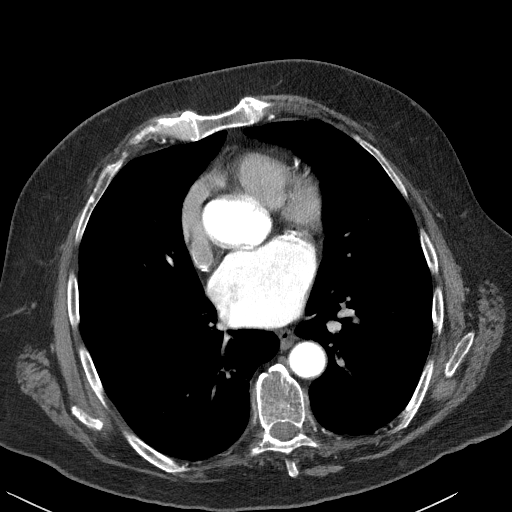
[im 50/99  lung]
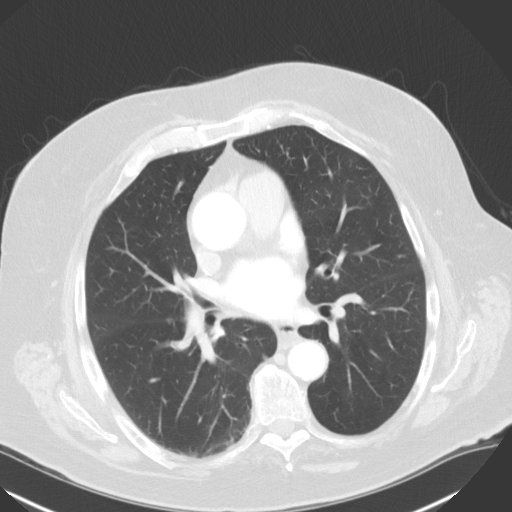
[im 57/99  soft-tissue]
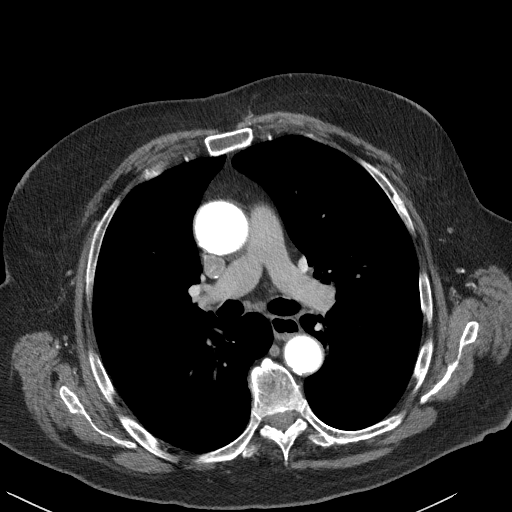
[im 64/99  lung]
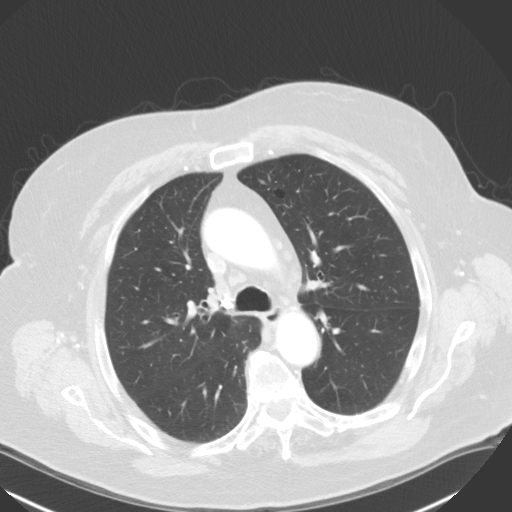
[im 71/99  soft-tissue]
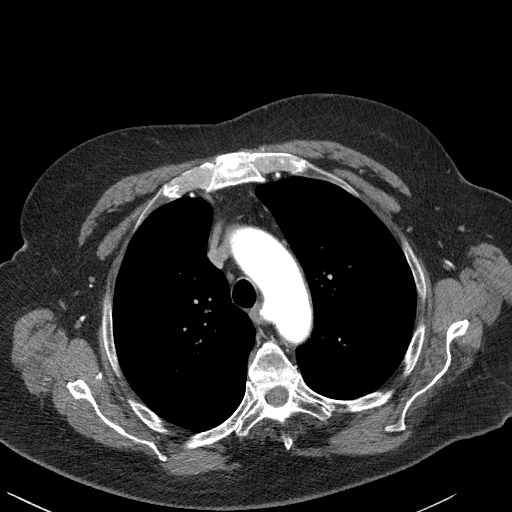
[im 78/99  lung]
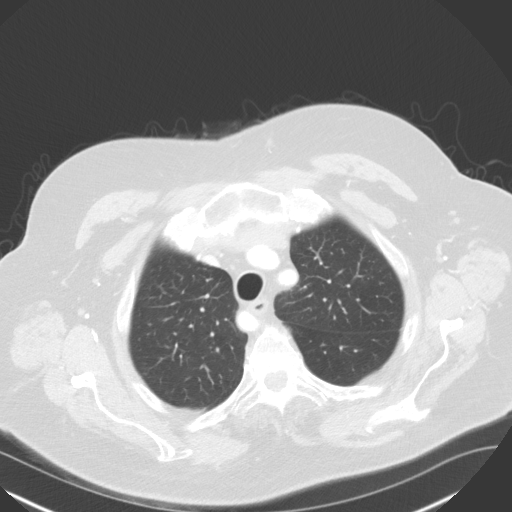
[im 85/99  soft-tissue]
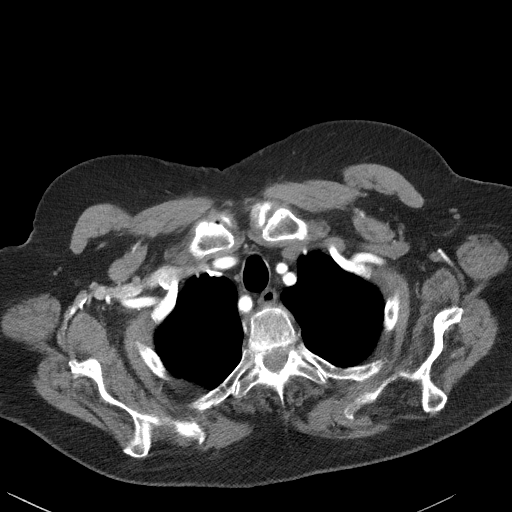
[im 92/99  lung]
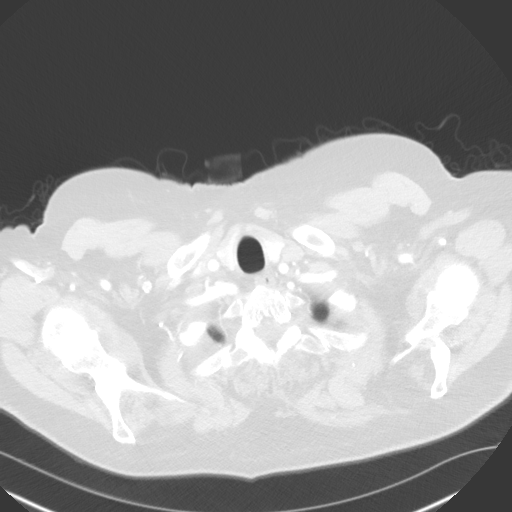

[Series 7: coronals · coronal · 0.59mm/px · 3 of 158 slices shown]
[im 40/158  soft-tissue]
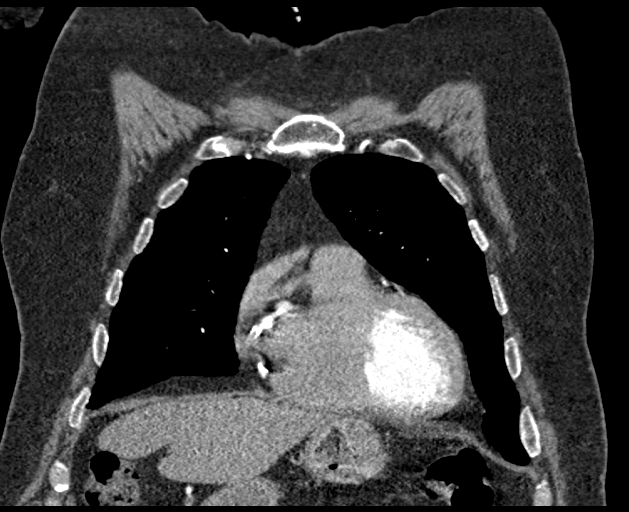
[im 79/158  soft-tissue]
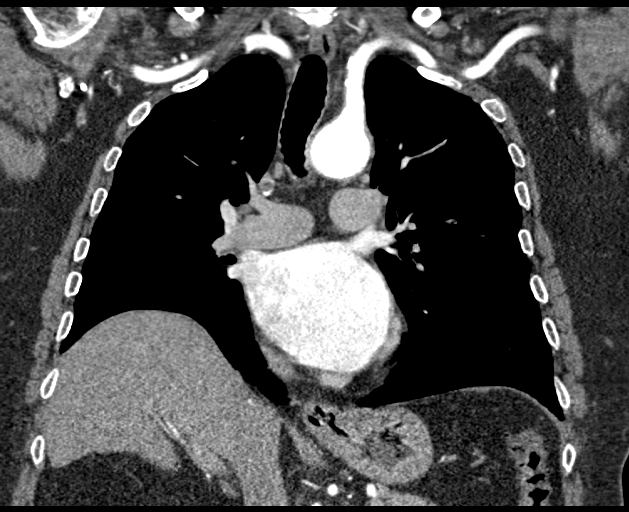
[im 118/158  soft-tissue]
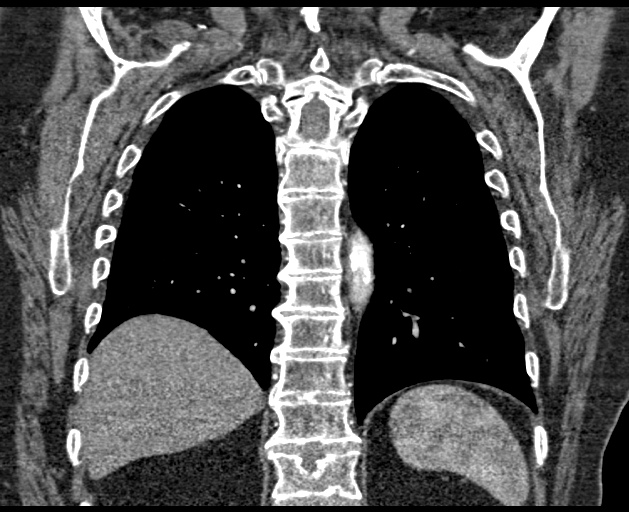

[16 of 46 positions shown; findings below may reference images not displayed]

FINDINGS: Cardiovascular:

The thoracic aorta measures:

Sinuses of Valsalva: 4.0 x 4.6 cm

Ascending aorta: 3.9 x 3.8 cm

Aortic arch (immediately posterior to the left subclavian artery
origin) 3.6 x 3.1 cm

Distal thoracic aorta (left atrium): 2.8 x 2.9 cm

No intramural hematoma or dissection. Aberrant origin of the right
subclavian artery. Mild scattered atherosclerotic plaque throughout
the abdominal aorta and proximal arch vasculature. Wide patency of
the proximal arch vasculature. Less than 50% stenosis of the a left
vertebral artery at its origin.

Mild cardiomegaly with particular enlargement of the left ventricle
and left atrium. Extensive multi-vessel coronary artery
calcification. The aortic valve is trileaflet. Central pulmonary
arteries are of normal caliber.

Mediastinum/Nodes: Visualized thyroid unremarkable. No pathologic
thoracic adenopathy. The esophagus is unremarkable. Small hiatal
hernia.

Lungs/Pleura: Lungs are clear. No pleural effusion or pneumothorax.
Central airways are widely patent.

Upper Abdomen: Status post cholecystectomy. Indeterminate right
adrenal nodule measuring 2.4 cm is stable since remote prior
examination of 09/19/2014, likely representing an adrenal adenoma.
Limited images of the upper abdomen are otherwise unremarkable.

Musculoskeletal: Degenerative changes are seen within the thoracic
spine. No lytic or blastic bone lesions.

Review of the MIP images confirms the above findings.
IMPRESSION: Stable thoracic aortic aneurysm. Maximal diameter of 4.6 cm at the
level of the sinuses of Valsalva. Ascending thoracic aortic
aneurysm. Recommend semi-annual imaging followup by CTA or MRA and
referral to cardiothoracic surgery if not already obtained. This
recommendation follows 7989
ACCF/AHA/AATS/ACR/ASA/SCA/WEEKES/JURICA-VESNA/DON LOLITO/XNAXNA Guidelines for the
Diagnosis and Management of Patients With Thoracic Aortic Disease.
Circulation. 7989; 121: E266-e369. Aortic aneurysm NOS (FRZH8-5II.I)

Extensive coronary artery calcification. Mild cardiomegaly with left
ventricular dilation and left atrial enlargement.

Stable probable right adrenal adenoma.

Aortic Atherosclerosis (FRZH8-YKQ.Q). Aortic aneurysm NOS
(FRZH8-5II.I).

## 2021-11-27 DIAGNOSIS — N1831 Chronic kidney disease, stage 3a: Secondary | ICD-10-CM | POA: Diagnosis not present

## 2021-11-27 DIAGNOSIS — K219 Gastro-esophageal reflux disease without esophagitis: Secondary | ICD-10-CM | POA: Diagnosis not present

## 2021-11-27 DIAGNOSIS — G8929 Other chronic pain: Secondary | ICD-10-CM | POA: Diagnosis not present

## 2021-11-27 DIAGNOSIS — I480 Paroxysmal atrial fibrillation: Secondary | ICD-10-CM | POA: Diagnosis not present

## 2021-11-27 DIAGNOSIS — E7800 Pure hypercholesterolemia, unspecified: Secondary | ICD-10-CM | POA: Diagnosis not present

## 2021-11-27 DIAGNOSIS — I509 Heart failure, unspecified: Secondary | ICD-10-CM | POA: Diagnosis not present

## 2021-12-07 DIAGNOSIS — H18511 Endothelial corneal dystrophy, right eye: Secondary | ICD-10-CM | POA: Diagnosis not present

## 2021-12-07 DIAGNOSIS — Z961 Presence of intraocular lens: Secondary | ICD-10-CM | POA: Diagnosis not present

## 2021-12-07 DIAGNOSIS — Z947 Corneal transplant status: Secondary | ICD-10-CM | POA: Diagnosis not present

## 2021-12-07 DIAGNOSIS — H40003 Preglaucoma, unspecified, bilateral: Secondary | ICD-10-CM | POA: Diagnosis not present

## 2022-01-05 DIAGNOSIS — G4733 Obstructive sleep apnea (adult) (pediatric): Secondary | ICD-10-CM | POA: Diagnosis not present

## 2022-01-25 DIAGNOSIS — I480 Paroxysmal atrial fibrillation: Secondary | ICD-10-CM | POA: Diagnosis not present

## 2022-01-25 DIAGNOSIS — I509 Heart failure, unspecified: Secondary | ICD-10-CM | POA: Diagnosis not present

## 2022-01-25 DIAGNOSIS — E261 Secondary hyperaldosteronism: Secondary | ICD-10-CM | POA: Diagnosis not present

## 2022-01-25 DIAGNOSIS — Z7902 Long term (current) use of antithrombotics/antiplatelets: Secondary | ICD-10-CM | POA: Diagnosis not present

## 2022-01-25 DIAGNOSIS — D6869 Other thrombophilia: Secondary | ICD-10-CM | POA: Diagnosis not present

## 2022-01-25 DIAGNOSIS — Z6831 Body mass index (BMI) 31.0-31.9, adult: Secondary | ICD-10-CM | POA: Diagnosis not present

## 2022-01-25 DIAGNOSIS — Z7901 Long term (current) use of anticoagulants: Secondary | ICD-10-CM | POA: Diagnosis not present

## 2022-01-28 ENCOUNTER — Telehealth: Payer: Self-pay | Admitting: Cardiology

## 2022-01-28 ENCOUNTER — Other Ambulatory Visit: Payer: Self-pay | Admitting: Dermatology

## 2022-01-28 MED ORDER — TORSEMIDE 20 MG PO TABS: ORAL_TABLET | ORAL | 2 refills | Status: DC

## 2022-01-28 NOTE — Telephone Encounter (Signed)
Pt's medication was sent to pt's pharmacy as requested. Confirmation received.  °

## 2022-01-28 NOTE — Telephone Encounter (Signed)
*  STAT* If patient is at the pharmacy, call can be transferred to refill team.   1. Which medications need to be refilled? (please list name of each medication and dose if known) torsemide (DEMADEX) 20 MG tablet  2. Which pharmacy/location (including street and city if local pharmacy) is medication to be sent to?  [facility] - [place], [place] - [place] [address]  3. Do they need a 30 day or 90 day supply? 90 days

## 2022-02-04 ENCOUNTER — Other Ambulatory Visit: Payer: Self-pay | Admitting: Dermatology

## 2022-02-04 DIAGNOSIS — G8929 Other chronic pain: Secondary | ICD-10-CM | POA: Diagnosis not present

## 2022-02-04 DIAGNOSIS — I509 Heart failure, unspecified: Secondary | ICD-10-CM | POA: Diagnosis not present

## 2022-02-04 DIAGNOSIS — K219 Gastro-esophageal reflux disease without esophagitis: Secondary | ICD-10-CM | POA: Diagnosis not present

## 2022-02-04 DIAGNOSIS — E7800 Pure hypercholesterolemia, unspecified: Secondary | ICD-10-CM | POA: Diagnosis not present

## 2022-02-04 DIAGNOSIS — I480 Paroxysmal atrial fibrillation: Secondary | ICD-10-CM | POA: Diagnosis not present

## 2022-02-05 DIAGNOSIS — G4733 Obstructive sleep apnea (adult) (pediatric): Secondary | ICD-10-CM | POA: Diagnosis not present

## 2022-02-24 DIAGNOSIS — N1831 Chronic kidney disease, stage 3a: Secondary | ICD-10-CM | POA: Diagnosis not present

## 2022-02-24 DIAGNOSIS — E7800 Pure hypercholesterolemia, unspecified: Secondary | ICD-10-CM | POA: Diagnosis not present

## 2022-02-24 DIAGNOSIS — I480 Paroxysmal atrial fibrillation: Secondary | ICD-10-CM | POA: Diagnosis not present

## 2022-02-24 DIAGNOSIS — G8929 Other chronic pain: Secondary | ICD-10-CM | POA: Diagnosis not present

## 2022-02-24 DIAGNOSIS — K219 Gastro-esophageal reflux disease without esophagitis: Secondary | ICD-10-CM | POA: Diagnosis not present

## 2022-03-05 ENCOUNTER — Encounter (HOSPITAL_BASED_OUTPATIENT_CLINIC_OR_DEPARTMENT_OTHER): Payer: Self-pay | Admitting: Emergency Medicine

## 2022-03-05 ENCOUNTER — Other Ambulatory Visit: Payer: Self-pay

## 2022-03-05 ENCOUNTER — Emergency Department (HOSPITAL_BASED_OUTPATIENT_CLINIC_OR_DEPARTMENT_OTHER)
Admission: EM | Admit: 2022-03-05 | Discharge: 2022-03-05 | Disposition: A | Discharge status: Home / Self Care | Payer: PPO | Attending: Emergency Medicine | Admitting: Emergency Medicine

## 2022-03-05 ENCOUNTER — Emergency Department (HOSPITAL_BASED_OUTPATIENT_CLINIC_OR_DEPARTMENT_OTHER): Payer: PPO

## 2022-03-05 DIAGNOSIS — Z7902 Long term (current) use of antithrombotics/antiplatelets: Secondary | ICD-10-CM | POA: Insufficient documentation

## 2022-03-05 DIAGNOSIS — S0083XA Contusion of other part of head, initial encounter: Secondary | ICD-10-CM | POA: Diagnosis not present

## 2022-03-05 DIAGNOSIS — M25522 Pain in left elbow: Secondary | ICD-10-CM | POA: Diagnosis not present

## 2022-03-05 DIAGNOSIS — I4891 Unspecified atrial fibrillation: Secondary | ICD-10-CM | POA: Diagnosis not present

## 2022-03-05 DIAGNOSIS — S5002XA Contusion of left elbow, initial encounter: Secondary | ICD-10-CM | POA: Diagnosis not present

## 2022-03-05 DIAGNOSIS — I509 Heart failure, unspecified: Secondary | ICD-10-CM | POA: Diagnosis not present

## 2022-03-05 DIAGNOSIS — I2510 Atherosclerotic heart disease of native coronary artery without angina pectoris: Secondary | ICD-10-CM | POA: Insufficient documentation

## 2022-03-05 DIAGNOSIS — S0990XA Unspecified injury of head, initial encounter: Secondary | ICD-10-CM

## 2022-03-05 DIAGNOSIS — I110 Hypertensive heart disease with heart failure: Secondary | ICD-10-CM | POA: Insufficient documentation

## 2022-03-05 DIAGNOSIS — S0003XA Contusion of scalp, initial encounter: Secondary | ICD-10-CM | POA: Diagnosis not present

## 2022-03-05 DIAGNOSIS — Y9389 Activity, other specified: Secondary | ICD-10-CM | POA: Insufficient documentation

## 2022-03-05 DIAGNOSIS — Z7901 Long term (current) use of anticoagulants: Secondary | ICD-10-CM | POA: Diagnosis not present
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
## 2022-03-07 DIAGNOSIS — G4733 Obstructive sleep apnea (adult) (pediatric): Secondary | ICD-10-CM | POA: Diagnosis not present

## 2022-03-08 DIAGNOSIS — M25522 Pain in left elbow: Secondary | ICD-10-CM | POA: Diagnosis not present

## 2022-03-15 DIAGNOSIS — M25522 Pain in left elbow: Secondary | ICD-10-CM | POA: Diagnosis not present

## 2022-03-19 ENCOUNTER — Other Ambulatory Visit: Payer: Self-pay | Admitting: Dermatology

## 2022-03-23 ENCOUNTER — Other Ambulatory Visit: Payer: Self-pay | Admitting: Dermatology

## 2022-03-30 ENCOUNTER — Encounter: Payer: Self-pay | Admitting: Cardiology

## 2022-03-30 ENCOUNTER — Ambulatory Visit: Payer: PPO | Admitting: Cardiology

## 2022-03-30 VITALS — BP 122/80 | HR 75 | Ht 72.0 in | Wt 240.0 lb

## 2022-03-30 DIAGNOSIS — I420 Dilated cardiomyopathy: Secondary | ICD-10-CM

## 2022-03-30 DIAGNOSIS — I4821 Permanent atrial fibrillation: Secondary | ICD-10-CM | POA: Diagnosis not present

## 2022-03-30 DIAGNOSIS — I25119 Atherosclerotic heart disease of native coronary artery with unspecified angina pectoris: Secondary | ICD-10-CM

## 2022-03-30 DIAGNOSIS — I77810 Thoracic aortic ectasia: Secondary | ICD-10-CM

## 2022-03-30 DIAGNOSIS — I10 Essential (primary) hypertension: Secondary | ICD-10-CM | POA: Diagnosis not present

## 2022-03-30 DIAGNOSIS — E785 Hyperlipidemia, unspecified: Secondary | ICD-10-CM

## 2022-03-30 DIAGNOSIS — I5042 Chronic combined systolic (congestive) and diastolic (congestive) heart failure: Secondary | ICD-10-CM

## 2022-03-30 DIAGNOSIS — G4733 Obstructive sleep apnea (adult) (pediatric): Secondary | ICD-10-CM | POA: Diagnosis not present

## 2022-03-30 NOTE — Progress Notes (Signed)
Cardiology Office Note:    Date:  03/30/2022   ID:  [name], DOB Dec 20, 1934, MRN [id number]  PCP:  Lajean Manes, MD  Cardiologist:  Fransico Him, MD    Referring MD: Lajean Manes, MD   Chief Complaint  Patient presents with   Atrial Fibrillation   Coronary Artery Disease   Hyperlipidemia   Congestive Heart Failure   Cardiomyopathy    History of Present Illness:    [name] is a 87 y.o. male with a hx of  PAF s/p DCCV 10/2014, dilated aortic root at (4.4 cm) and ascending aorta (4.0 cm) on most recent assessment. He has h/o systolic HF, ASCAD by cath with 30% LM at the ostium, 70-80% ostial D2, 30-40% prox LCX, 30-40% prox and 40% mid RCA and is on medical management.  He is on chronic anticoagulation therapy with Eliquis. He was previously on amiodarone for his afib, however this was discontinued 10/2016 due to intolerances (visual disturbances and bilateral hand numbness) and now has permanent atrial fibrillation.    2D echo 10/2017 showed a decline in EF with moderate reduced LVF with EF 35-40% and moderately dilated aortic root at 46 mm.  He underwent cath showing an occluded RPDA which filled via left to right collaterals, 85% chronic stenosis of oD2, 99% mid RCA, 30% proximal to mid LAD, 30% proximal to distal left circumflex and moderate LV dysfunction with EF 25-35%.  He underwent PCI with drug-eluting stent to the mid RCA.  He also was noted to have moderate pulmonary hypertension with an LVEDP of 29 and a pulmonary capillary wedge closure to 40.  It was felt that he likely had a combination of group 2 and group 3 pulmonary hypertension secondary to obstructive sleep apnea as well as congestive heart failure with pulmonary venous hypertension.  Repeat echo 09/2018 showed an improvement in LVF with E 40-45% with mild to moderate diffuse LV dysfunction and 67m aortic root at sinuses of Valsalva.  Chest CTA the year before showed 414mSinus of Valsalva.    he is here  today for followup and is doing well.  He has chronic DOE when going up stairs that is stable. He denies any chest pain or pressure, PND, orthopnea, LE edema, dizziness, palpitations or syncope. He is compliant with his meds and is tolerating meds with no SE.     He is doing well with his CPAP device and thinks that he has gotten used to it.  He tolerates the mask and feels the pressure is adequate.  Since going on CPAP he feels rested in the am and has no significant daytime sleepiness.  He denies any significant mouth or nasal dryness or nasal congestion.  He does not think that he snores.     Past Medical History:  Diagnosis Date   Aortic aneurysm (HCC)    4612mt sinuses of Valsalva and 71m38mcending aorta by chest CTA 10/2019   Benign essential HTN 09/17/2014   CAD (coronary artery disease), native coronary artery 09/20/2014   30% LM at the ostium, 70-80% ostial D2, 30-40% prox LCX, 30-40% prox and 40% mid RCA and EF 40%. 11/07/17 DES to RCA    Chronic lower back pain    DCM (dilated cardiomyopathy) (HCC)[place]/2016   a.  EF 40%; b. Echo 5/16:  EF 40-45%, diff HK, inf AK, mild AI, MAC, severe LAE   Dilated aortic root (HCC)[place]/09/2014   41mm59mSInus of Valsalva on echo 09/2018  DJD (degenerative joint disease)    feet, knees and back   GERD (gastroesophageal reflux disease)    hiatal hernia   Hepatitis 1953   "A or B; not sure which"   High cholesterol    History of blood transfusion 1954   "related to hip OR"   History of hip surgery 1954   "knocked hips out of joints; had to have bone grafts; both sides"   Hyperlipidemia LDL goal <70 10/14/2015   Lesion of left femoral nerve    3 cm MRI 04/2010 consistent with hemangioma   Mild aortic insufficiency    Obesity    OSA (obstructive sleep apnea) 10/14/2015   Mild with AHI 10.6/hr overall and 38/hr during REM sleep.  On CPAP at 9cm H2O   OSA on CPAP    Osteoarthritis    "all over" (11/07/2017)   PAF (paroxysmal atrial fibrillation)  ([name]) 09/17/2014   PSA (psoriatic arthritis) (HCC)    Mild increase 3.65 on 05/2011 stable 3.68 on 09/2011   Renal cyst, left 2010   5 cm seen on ultrasound     Past Surgical History:  Procedure Laterality Date   APPENDECTOMY     BACK SURGERY     CARDIOVERSION N/A 11/04/2014   Procedure: CARDIOVERSION;  Surgeon: Sueanne Margarita, MD;  Location: [facility] ENDOSCOPY;  Service: Cardiovascular;  Laterality: N/A;   CARDIOVERSION N/A 01/09/2015   Procedure: CARDIOVERSION;  Surgeon: Pixie Casino, MD;  Location: [facility] ENDOSCOPY;  Service: Cardiovascular;  Laterality: N/A;   CATARACT EXTRACTION W/ INTRAOCULAR LENS  IMPLANT, BILATERAL Bilateral    CORONARY STENT INTERVENTION N/A 11/07/2017   Procedure: CORONARY STENT INTERVENTION;  Surgeon: Leonie Man, MD;  Location: [facility] CV LAB;  Service: Cardiovascular;  Laterality: N/A;   HERNIA REPAIR     JOINT REPLACEMENT     LAPAROSCOPIC CHOLECYSTECTOMY     LEFT HEART CATHETERIZATION WITH CORONARY ANGIOGRAM N/A 10/01/2014   Procedure: LEFT HEART CATHETERIZATION WITH CORONARY ANGIOGRAM;  Surgeon: Troy Sine, MD;  Location: [facility] CATH LAB;  Service: Cardiovascular;  Laterality: N/A;   LUMBAR LAMINECTOMY     REVISION TOTAL HIP ARTHROPLASTY Right    RIGHT/LEFT HEART CATH AND CORONARY ANGIOGRAPHY N/A 11/07/2017   Procedure: RIGHT/LEFT HEART CATH AND CORONARY ANGIOGRAPHY;  Surgeon: Leonie Man, MD;  Location: [facility] CV LAB;  Service: Cardiovascular;  Laterality: N/A;   SHOULDER OPEN ROTATOR CUFF REPAIR Bilateral    TONSILLECTOMY     TOTAL HIP ARTHROPLASTY Bilateral    TOTAL KNEE ARTHROPLASTY Bilateral    UMBILICAL HERNIA REPAIR      Current Medications: Current Meds  Medication Sig   acetaminophen (TYLENOL) 650 MG CR tablet Take 650 mg by mouth every 8 (eight) hours as needed for pain.   apixaban (ELIQUIS) 5 MG TABS tablet Take 1 tablet (5 mg total) by mouth 2 (two) times daily.   clobetasol (TEMOVATE) 0.05 % external solution Apply 1  application topically 2 (two) times daily.   clopidogrel (PLAVIX) 75 MG tablet TAKE ONE TABLET BY MOUTH EVERY MORNING   ezetimibe (ZETIA) 10 MG tablet Take 1 tablet (10 mg total) by mouth daily.   ketoconazole (NIZORAL) 2 % cream Apply topically 2 (two) times daily.   meloxicam (MOBIC) 7.5 MG tablet Take 7.5 mg by mouth daily.   metoprolol succinate (TOPROL-XL) 25 MG 24 hr tablet TAKE ONE TABLET BY MOUTH EVERY MORNING   MULTIPLE VITAMINS PO Take 1 tablet by mouth daily.   pantoprazole (PROTONIX) 40 MG  tablet Take 1 tablet (40 mg total) by mouth daily.   prednisoLONE acetate (PRED FORTE) 1 % ophthalmic suspension    rosuvastatin (CRESTOR) 40 MG tablet TAKE ONE TABLET BY MOUTH DAILY   sacubitril-valsartan (ENTRESTO) 49-51 MG Take 1 tablet by mouth 2 (two) times daily.   tamsulosin (FLOMAX) 0.4 MG CAPS capsule Take 0.4 mg by mouth daily.   torsemide (DEMADEX) 20 MG tablet Take 1 tablet (20 mg total) as needed for weight gain of 3 lbs in one day or 5 lbs in one week.   traMADol (ULTRAM) 50 MG tablet Take 50 mg by mouth every 6 (six) hours as needed for moderate pain.   triamcinolone cream (KENALOG) 0.1 % Apply topically 2 (two) times daily.     Allergies:   Simvastatin, Codeine, Penicillin g, Codeine phosphate, and Penicillins   Social History   Socioeconomic History   Marital status: Married    Spouse name: Not on file   Number of children: 4   Years of education: Not on file   Highest education level: Not on file  Occupational History   Occupation: Naval architect  Tobacco Use   Smoking status: Former    Packs/day: 1.00    Years: 30.00    Total pack years: 30.00    Types: Cigarettes   Smokeless tobacco: Never   Tobacco comments:    "quit smoking in the 1980s"  Vaping Use   Vaping Use: Never used  Substance and Sexual Activity   Alcohol use: No   Drug use: No   Sexual activity: Yes  Other Topics Concern   Not on file  Social History Narrative   Not on file   Social  Determinants of Health   Financial Resource Strain: Not on file  Food Insecurity: Not on file  Transportation Needs: Not on file  Physical Activity: Not on file  Stress: Not on file  Social Connections: Not on file     Family History: The patient's family history includes Atrial fibrillation in his sister; Cancer in his mother; Diabetes in his father; Heart attack in his maternal grandmother, paternal grandfather, and paternal grandmother; Heart disease in his sister; Stroke in his maternal grandfather.  ROS:   Please see the history of present illness.    ROS  All other systems reviewed and negative.   EKGs/Labs/Other Studies Reviewed:    The following studies were reviewed today: none  EKG:  EKG is ordered today and demonstrates atrial fibrillation with no ST changes  Recent Labs: 05/04/2021: Hemoglobin 16.0; Platelets 196 05/08/2021: BUN 33; Creatinine, Ser 1.34; Potassium 5.2; Sodium 138   Recent Lipid Panel    Component Value Date/Time   CHOL 149 10/20/2017 1220   TRIG 96 10/20/2017 1220   HDL 49 10/20/2017 1220   CHOLHDL 3.0 10/20/2017 1220   CHOLHDL 2.5 05/10/2016 1145   VLDL 19 05/10/2016 1145   LDLCALC 81 10/20/2017 1220    Physical Exam:    VS:  BP 122/80   Pulse 75   Ht 6' (1.829 m)   Wt 240 lb (108.9 kg)   BMI 32.55 kg/m     Wt Readings from Last 3 Encounters:  03/30/22 240 lb (108.9 kg)  03/05/22 231 lb (104.8 kg)  11/04/21 252 lb 6.4 oz (114.5 kg)    GEN: Well nourished, well developed in no acute distress HEENT: Normal NECK: No JVD; No carotid bruits LYMPHATICS: No lymphadenopathy CARDIAC:irregularly irregular, no murmurs, rubs, gallops RESPIRATORY:  Clear to auscultation without rales, wheezing  or rhonchi  ABDOMEN: Soft, non-tender, non-distended MUSCULOSKELETAL:  No edema; No deformity  SKIN: Warm and dry NEUROLOGIC:  Alert and oriented x 3 PSYCHIATRIC:  Normal affect   ASSESSMENT:    1. Permanent atrial fibrillation ([place])   2.  Coronary artery disease involving native coronary artery of native heart with angina pectoris ([place])   3. DCM (dilated cardiomyopathy) ([place])   4. Chronic combined systolic and diastolic CHF (congestive heart failure) ([place])   5. Dilated aortic root ([place])   6. Benign essential HTN   7. OSA (obstructive sleep apnea)   8. Hyperlipidemia LDL goal <70    PLAN:    In order of problems listed above:  Permanent atrial fibrillation -His heart is adequately controlled on exam and he denies any palpitations -He denies any bleeding problems on DOAC -Continue prescription drug management with Toprol-XL 25 mg daily and Eliquis 5 mg twice daily with as needed refills -I have personally reviewed and interpreted outside labs performed by patient's PCP which showed hemoglobin 16, serum creatinine 1.38 and potassium 4.8 on 10/06/2021  ASCAD  -Cath showed an occluded RPDA which filled via left to right collaterals, 85% chronic stenosis of ostial D2, 99% mid RCA, 30% proximal to mid LAD, 30% proximal to distal left circumflex and moderate LV dysfunction with EF 25-35% echo showed EF 35 to 40%. -s/p PCI with drug-eluting stent to the mid RCA.     -He denies any anginal symptoms -no ASA due to DOAC -Continue prescription drug management with Plavix 75 mg daily, Toprol-XL 25 mg daily Crestor 40 mg daily with as needed refills  Ischemic dilated cardiomyopathy/chronic combined systolic/diastolic CHF  -He is NYHA class II and denies any current symptoms -He appears euvolemic on exam -Echo 10/20/2017 showed EF 35 to 40% and improvement to 40-45% on echo 09/2018.  -Can do prescription drug management with torsemide 20 mg daily as needed for weight gain greater than 3 pounds in 1 day or 5 pounds in 1 week -Continue prescription drug management Toprol-XL 25 mg daily and Entresto 49-51 mg twice daily with as needed refills  Dilated aortic root -dilated at sinuses of Valsalva at 36m by Chest CTA 10/2019 and  10/2020 -repeat chest CTA in 09/2022 to reassess -Continue statin -BP controlled   HTN  -BP is adequately controlled on exam today -Continue Prescription drug management Toprol-XL 25 mg daily and Entresto 49-51 mg twice daily with as needed refills   OSA - The patient is tolerating PAP therapy well without any problems. The PAP download performed by his DME was personally reviewed and interpreted by me today and showed an AHI of 26.2 /hr on auto CPAP from 4-15 cm H2O with 97% compliance in using more than 4 hours nightly.  The patient has been using and benefiting from PAP use and will continue to benefit from therapy.  -His AHI is very high without any significant leak on his mask -He is going to try his new mask he got recently and I will repeat download in 4 weeks -followup with me virtual in 8 weeks   Hyperlipidemia  -LDL goal < 70.   -Continue drug management with Crestor 40 mg daily and Zetia 10 mg daily with as needed refills -I have personally reviewed and interpreted outside labs performed by patient's PCP which showed LDL 48 and HDL 41 on 10/06/2021   Medication Adjustments/Labs and Tests Ordered: Current medicines are reviewed at length with the patient today.  Concerns regarding medicines are outlined above.  Orders Placed This Encounter  Procedures   EKG 12-Lead   No orders of the defined types were placed in this encounter.   Signed, Fransico Him, MD  03/30/2022 2:02 PM    [place]

## 2022-03-30 NOTE — Patient Instructions (Signed)
Medication Instructions:  Your physician recommends that you continue on your current medications as directed. Please refer to the Current Medication list given to you today.  *If you need a refill on your cardiac medications before your next appointment, please call your pharmacy*   Lab Work: TODAY: CMET, CBC If you have labs (blood work) drawn today and your tests are completely normal, you will receive your results only by: [place] (if you have MyChart) OR A paper copy in the mail If you have any lab test that is abnormal or we need to change your treatment, we will call you to review the results  Follow-Up: At [facility], you and your health needs are our priority.  As part of our continuing mission to provide you with exceptional heart care, we have created designated Provider Care Teams.  These Care Teams include your primary Cardiologist (physician) and Advanced Practice Providers (APPs -  Physician Assistants and Nurse Practitioners) who all work together to provide you with the care you need, when you need it.  Your next appointment:   6 month(s)  The format for your next appointment:   In Person  Provider:   Fransico Him, MD    Important Information About Sugar

## 2022-03-30 NOTE — Addendum Note (Signed)
Addended by: Antonieta Iba on: 03/30/2022 02:12 PM   Modules accepted: Orders

## 2022-03-31 ENCOUNTER — Other Ambulatory Visit: Payer: Self-pay | Admitting: Dermatology

## 2022-03-31 ENCOUNTER — Telehealth: Payer: Self-pay | Admitting: *Deleted

## 2022-03-31 LAB — CBC
Hematocrit: 50 % (ref 37.5–51.0)
Hemoglobin: 16.1 g/dL (ref 13.0–17.7)
MCH: 27.2 pg (ref 26.6–33.0)
MCHC: 32.2 g/dL (ref 31.5–35.7)
MCV: 85 fL (ref 79–97)
Platelets: 175 10*3/uL (ref 150–450)
RBC: 5.91 x10E6/uL — ABNORMAL HIGH (ref 4.14–5.80)
RDW: 15 % (ref 11.6–15.4)
WBC: 8.9 10*3/uL (ref 3.4–10.8)

## 2022-03-31 LAB — COMPREHENSIVE METABOLIC PANEL
ALT: 17 IU/L (ref 0–44)
AST: 23 IU/L (ref 0–40)
Albumin/Globulin Ratio: 1.3 (ref 1.2–2.2)
Albumin: 4.3 g/dL (ref 3.7–4.7)
Alkaline Phosphatase: 53 IU/L (ref 44–121)
BUN/Creatinine Ratio: 24 (ref 10–24)
BUN: 34 mg/dL — ABNORMAL HIGH (ref 8–27)
Bilirubin Total: 0.6 mg/dL (ref 0.0–1.2)
CO2: 21 mmol/L (ref 20–29)
Calcium: 9.8 mg/dL (ref 8.6–10.2)
Chloride: 100 mmol/L (ref 96–106)
Creatinine, Ser: 1.43 mg/dL — ABNORMAL HIGH (ref 0.76–1.27)
Globulin, Total: 3.2 g/dL (ref 1.5–4.5)
Glucose: 94 mg/dL (ref 70–99)
Potassium: 5.1 mmol/L (ref 3.5–5.2)
Sodium: 139 mmol/L (ref 134–144)
Total Protein: 7.5 g/dL (ref 6.0–8.5)
eGFR: 48 mL/min/{1.73_m2} — ABNORMAL LOW (ref >59)

## 2022-03-31 NOTE — Telephone Encounter (Signed)
Reminder made to get d/l

## 2022-03-31 NOTE — Telephone Encounter (Signed)
-----   Message from Antonieta Iba, RN sent at 03/30/2022  2:17 PM EDT ----- Please get a download on this patient in 4 weeks.  Thank you! Carly

## 2022-04-06 DIAGNOSIS — R21 Rash and other nonspecific skin eruption: Secondary | ICD-10-CM | POA: Diagnosis not present

## 2022-04-06 DIAGNOSIS — D6869 Other thrombophilia: Secondary | ICD-10-CM | POA: Diagnosis not present

## 2022-04-06 DIAGNOSIS — B372 Candidiasis of skin and nail: Secondary | ICD-10-CM | POA: Diagnosis not present

## 2022-04-06 DIAGNOSIS — I509 Heart failure, unspecified: Secondary | ICD-10-CM | POA: Diagnosis not present

## 2022-04-06 DIAGNOSIS — I480 Paroxysmal atrial fibrillation: Secondary | ICD-10-CM | POA: Diagnosis not present

## 2022-04-07 DIAGNOSIS — G4733 Obstructive sleep apnea (adult) (pediatric): Secondary | ICD-10-CM | POA: Diagnosis not present

## 2022-04-15 DIAGNOSIS — G4733 Obstructive sleep apnea (adult) (pediatric): Secondary | ICD-10-CM | POA: Diagnosis not present

## 2022-04-23 DIAGNOSIS — I509 Heart failure, unspecified: Secondary | ICD-10-CM | POA: Diagnosis not present

## 2022-04-23 DIAGNOSIS — I480 Paroxysmal atrial fibrillation: Secondary | ICD-10-CM | POA: Diagnosis not present

## 2022-04-23 DIAGNOSIS — E7800 Pure hypercholesterolemia, unspecified: Secondary | ICD-10-CM | POA: Diagnosis not present

## 2022-04-23 DIAGNOSIS — N1831 Chronic kidney disease, stage 3a: Secondary | ICD-10-CM | POA: Diagnosis not present

## 2022-04-23 DIAGNOSIS — G8929 Other chronic pain: Secondary | ICD-10-CM | POA: Diagnosis not present

## 2022-04-23 DIAGNOSIS — K219 Gastro-esophageal reflux disease without esophagitis: Secondary | ICD-10-CM | POA: Diagnosis not present

## 2022-05-16 DIAGNOSIS — G4733 Obstructive sleep apnea (adult) (pediatric): Secondary | ICD-10-CM | POA: Diagnosis not present

## 2022-05-25 DIAGNOSIS — G8929 Other chronic pain: Secondary | ICD-10-CM | POA: Diagnosis not present

## 2022-05-25 DIAGNOSIS — N1831 Chronic kidney disease, stage 3a: Secondary | ICD-10-CM | POA: Diagnosis not present

## 2022-05-25 DIAGNOSIS — I480 Paroxysmal atrial fibrillation: Secondary | ICD-10-CM | POA: Diagnosis not present

## 2022-05-25 DIAGNOSIS — K219 Gastro-esophageal reflux disease without esophagitis: Secondary | ICD-10-CM | POA: Diagnosis not present

## 2022-05-25 DIAGNOSIS — E7800 Pure hypercholesterolemia, unspecified: Secondary | ICD-10-CM | POA: Diagnosis not present

## 2022-06-01 DIAGNOSIS — Z96652 Presence of left artificial knee joint: Secondary | ICD-10-CM | POA: Diagnosis not present

## 2022-06-16 DIAGNOSIS — G4733 Obstructive sleep apnea (adult) (pediatric): Secondary | ICD-10-CM | POA: Diagnosis not present

## 2022-06-21 ENCOUNTER — Other Ambulatory Visit: Payer: Self-pay | Admitting: Cardiology

## 2022-06-21 NOTE — Telephone Encounter (Signed)
Prescription refill request for Eliquis received. Indication:afib Last office visit:8/23 Scr:1.4 Age: 87 Weight:108.9 kg  Prescription refilled

## 2022-06-29 ENCOUNTER — Other Ambulatory Visit: Payer: Self-pay | Admitting: Cardiology

## 2022-07-07 DIAGNOSIS — E7800 Pure hypercholesterolemia, unspecified: Secondary | ICD-10-CM | POA: Diagnosis not present

## 2022-07-07 DIAGNOSIS — G8929 Other chronic pain: Secondary | ICD-10-CM | POA: Diagnosis not present

## 2022-07-07 DIAGNOSIS — N1831 Chronic kidney disease, stage 3a: Secondary | ICD-10-CM | POA: Diagnosis not present

## 2022-07-07 DIAGNOSIS — I509 Heart failure, unspecified: Secondary | ICD-10-CM | POA: Diagnosis not present

## 2022-07-07 DIAGNOSIS — N401 Enlarged prostate with lower urinary tract symptoms: Secondary | ICD-10-CM | POA: Diagnosis not present

## 2022-07-07 DIAGNOSIS — K219 Gastro-esophageal reflux disease without esophagitis: Secondary | ICD-10-CM | POA: Diagnosis not present

## 2022-07-16 DIAGNOSIS — G4733 Obstructive sleep apnea (adult) (pediatric): Secondary | ICD-10-CM | POA: Diagnosis not present

## 2022-07-26 DIAGNOSIS — N1831 Chronic kidney disease, stage 3a: Secondary | ICD-10-CM | POA: Diagnosis not present

## 2022-07-26 DIAGNOSIS — E7800 Pure hypercholesterolemia, unspecified: Secondary | ICD-10-CM | POA: Diagnosis not present

## 2022-07-26 DIAGNOSIS — I509 Heart failure, unspecified: Secondary | ICD-10-CM | POA: Diagnosis not present

## 2022-07-26 DIAGNOSIS — K219 Gastro-esophageal reflux disease without esophagitis: Secondary | ICD-10-CM | POA: Diagnosis not present

## 2022-07-26 DIAGNOSIS — G8929 Other chronic pain: Secondary | ICD-10-CM | POA: Diagnosis not present

## 2022-08-16 DIAGNOSIS — G4733 Obstructive sleep apnea (adult) (pediatric): Secondary | ICD-10-CM | POA: Diagnosis not present

## 2022-08-16 DIAGNOSIS — J209 Acute bronchitis, unspecified: Secondary | ICD-10-CM | POA: Diagnosis not present

## 2022-08-16 DIAGNOSIS — R051 Acute cough: Secondary | ICD-10-CM | POA: Diagnosis not present

## 2022-08-20 ENCOUNTER — Ambulatory Visit
Admission: RE | Admit: 2022-08-20 | Discharge: 2022-08-20 | Disposition: A | Discharge status: Home / Self Care | Payer: PPO | Source: Ambulatory Visit | Attending: Physician Assistant | Admitting: Physician Assistant

## 2022-08-20 ENCOUNTER — Other Ambulatory Visit: Payer: Self-pay | Admitting: Physician Assistant

## 2022-08-20 ENCOUNTER — Encounter: Payer: Self-pay | Admitting: Physician Assistant

## 2022-08-20 DIAGNOSIS — R051 Acute cough: Secondary | ICD-10-CM

## 2022-08-20 DIAGNOSIS — I509 Heart failure, unspecified: Secondary | ICD-10-CM

## 2022-08-20 DIAGNOSIS — R600 Localized edema: Secondary | ICD-10-CM | POA: Diagnosis not present

## 2022-08-20 DIAGNOSIS — R609 Edema, unspecified: Secondary | ICD-10-CM

## 2022-08-20 DIAGNOSIS — R059 Cough, unspecified: Secondary | ICD-10-CM | POA: Diagnosis not present

## 2022-08-26 DIAGNOSIS — I509 Heart failure, unspecified: Secondary | ICD-10-CM | POA: Diagnosis not present

## 2022-09-21 ENCOUNTER — Other Ambulatory Visit: Payer: Self-pay | Admitting: Cardiology

## 2022-10-01 ENCOUNTER — Ambulatory Visit: Payer: PPO | Attending: Cardiology | Admitting: Cardiology

## 2022-10-01 ENCOUNTER — Encounter: Payer: Self-pay | Admitting: Cardiology

## 2022-10-01 VITALS — BP 120/62 | HR 70 | Ht 72.0 in | Wt 247.2 lb

## 2022-10-01 DIAGNOSIS — I420 Dilated cardiomyopathy: Secondary | ICD-10-CM | POA: Diagnosis not present

## 2022-10-01 DIAGNOSIS — I4821 Permanent atrial fibrillation: Secondary | ICD-10-CM

## 2022-10-01 DIAGNOSIS — Z01818 Encounter for other preprocedural examination: Secondary | ICD-10-CM

## 2022-10-01 DIAGNOSIS — I25119 Atherosclerotic heart disease of native coronary artery with unspecified angina pectoris: Secondary | ICD-10-CM

## 2022-10-01 DIAGNOSIS — G4733 Obstructive sleep apnea (adult) (pediatric): Secondary | ICD-10-CM

## 2022-10-01 DIAGNOSIS — E785 Hyperlipidemia, unspecified: Secondary | ICD-10-CM

## 2022-10-01 DIAGNOSIS — I10 Essential (primary) hypertension: Secondary | ICD-10-CM

## 2022-10-01 DIAGNOSIS — I77810 Thoracic aortic ectasia: Secondary | ICD-10-CM

## 2022-10-01 DIAGNOSIS — I5042 Chronic combined systolic (congestive) and diastolic (congestive) heart failure: Secondary | ICD-10-CM

## 2022-10-01 NOTE — Patient Instructions (Signed)
Medication Instructions:  Your physician recommends that you continue on your current medications as directed. Please refer to the Current Medication list given to you today.  *If you need a refill on your cardiac medications before your next appointment, please call your pharmacy*   Lab Work: Please complete a BMET in our lab today before you leave.  Please schedule a FASTING lipid panel and an ALT for a day when you are able to fast for at least 4 hours.   If you have labs (blood work) drawn today and your tests are completely normal, you will receive your results only by: [place] (if you have MyChart) OR A paper copy in the mail If you have any lab test that is abnormal or we need to change your treatment, we will call you to review the results.   Testing/Procedures:   Your cardiac CT will be scheduled at:   [facility] [address] [place], [place] [address] [phone]  Please arrive at the [facility] and Children[facility] Entrance (Entrance C2) of [facility] 30 minutes prior to test start time. You can use the FREE valet parking offered at entrance C (encouraged to control the heart rate for the test)  Proceed to the [facility] Radiology Department (first floor) to check-in and test prep.  All radiology patients and guests should use entrance C2 at [facility], accessed from [facility], even though the hospital's physical address listed is [address]    If scheduled at [facility] or [facility], please arrive 15 mins early for check-in and test prep.   Please follow these instructions carefully (unless otherwise directed):  Hold all erectile dysfunction medications at least 3 days (72 hrs) prior to test. (Ie viagra, cialis, sildenafil, tadalafil, etc) We will administer nitroglycerin during this exam.   On the Night Before the Test: Be sure to Drink plenty of  water. Do not consume any caffeinated/decaffeinated beverages or chocolate 12 hours prior to your test. Do not take any antihistamines 12 hours prior to your test.  On the Day of the Test: Drink plenty of water until 1 hour prior to the test. Do not eat any food 1 hour prior to test. You may take your regular medications prior to the test.  If you take Furosemide/Hydrochlorothiazide/Spironolactone, please HOLD on the morning of the test. FEMALES- please wear underwire-free bra if available, avoid dresses & tight clothing       After the Test: Drink plenty of water. After receiving IV contrast, you may experience a mild flushed feeling. This is normal. On occasion, you may experience a mild rash up to 24 hours after the test. This is not dangerous. If this occurs, you can take Benadryl 25 mg and increase your fluid intake. If you experience trouble breathing, this can be serious. If it is severe call 911 IMMEDIATELY. If it is mild, please call our office. If you take any of these medications: Glipizide/Metformin, Avandament, Glucavance, please do not take 48 hours after completing test unless otherwise instructed.  We will call to schedule your test 2-4 weeks out understanding that some insurance companies will need an authorization prior to the service being performed.   For non-scheduling related questions, please contact the cardiac imaging nurse navigator should you have any questions/concerns: Marchia Bond, Cardiac Imaging Nurse Navigator Gordy Clement, Cardiac Imaging Nurse Navigator [facility] Direct Office Dial: [phone]   For scheduling  needs, including cancellations and rescheduling, please call [place], [phone].    Follow-Up: At [facility], you and your health needs are our priority.  As part of our continuing mission to provide you with exceptional heart care, we have created designated Provider Care Teams.  These Care Teams  include your primary Cardiologist (physician) and Advanced Practice Providers (APPs -  Physician Assistants and Nurse Practitioners) who all work together to provide you with the care you need, when you need it.  We recommend signing up for the patient portal called "MyChart".  Sign up information is provided on this After Visit Summary.  MyChart is used to connect with patients for Virtual Visits (Telemedicine).  Patients are able to view lab/test results, encounter notes, upcoming appointments, etc.  Non-urgent messages can be sent to your provider as well.   To learn more about what you can do with MyChart, go to [email or web address].    Your next appointment:   1 year(s)  Provider:   Fransico Him, MD

## 2022-10-01 NOTE — Addendum Note (Signed)
Addended by: Joni Reining on: 10/01/2022 04:05 PM   Modules accepted: Orders

## 2022-10-01 NOTE — Progress Notes (Signed)
Cardiology Office Note:    Date:  10/01/2022   ID:  OMARRI NGAN, DOB March 28, 1935, MRN [id number]  PCP:  Lajean Manes, MD  Cardiologist:  Fransico Him, MD    Referring MD: Lajean Manes, MD   Chief Complaint  Patient presents with   Atrial Fibrillation   Coronary Artery Disease   Hypertension   Hyperlipidemia   Sleep Apnea   Congestive Heart Failure    History of Present Illness:    [name] is a 88 y.o. male with a hx of  PAF s/p DCCV 10/2014, dilated aortic root at (4.4 cm) and ascending aorta (4.0 cm) on most recent assessment. He has h/o systolic HF, ASCAD by cath with 30% LM at the ostium, 70-80% ostial D2, 30-40% prox LCX, 30-40% prox and 40% mid RCA and is on medical management.  He is on chronic anticoagulation therapy with Eliquis. He was previously on amiodarone for his afib, however this was discontinued 10/2016 due to intolerances (visual disturbances and bilateral hand numbness) and now has permanent atrial fibrillation.    2D echo 10/2017 showed a decline in EF with moderate reduced LVF with EF 35-40% and moderately dilated aortic root at 46 mm.  He underwent cath showing an occluded RPDA which filled via left to right collaterals, 85% chronic stenosis of oD2, 99% mid RCA, 30% proximal to mid LAD, 30% proximal to distal left circumflex and moderate LV dysfunction with EF 25-35%.  He underwent PCI with drug-eluting stent to the mid RCA.  He also was noted to have moderate pulmonary hypertension with an LVEDP of 29 and a pulmonary capillary wedge closure to 40.  It was felt that he likely had a combination of group 2 and group 3 pulmonary hypertension secondary to obstructive sleep apnea as well as congestive heart failure with pulmonary venous hypertension.  Repeat echo 09/2018 showed an improvement in LVF with E 40-45% with mild to moderate diffuse LV dysfunction and 85m aortic root at sinuses of Valsalva.  Chest CTA the year before showed 476mSinus of Valsalva.     He is here today for followup and is doing well.  He occasionally will wake up with some pain in his chest but if he sits up and belches the pain resolves immediately.  He denies any exertional chest pain or pressure, SOB, DOE (except when he goes up stairs but is very sedentary), PND, orthopnea, dizziness, palpitations or syncope. He has occasional mild LE edema controlled on diuretics. He is compliant with his meds and is tolerating meds with no SE.    He is doing well with his PAP device and thinks that he has gotten used to it.  He tolerates the mask and feels the pressure is adequate.  Since going on PAP he feels rested in the am but does nap during the day. He denies any significant mouth or nasal dryness or nasal congestion.  He does not think that he snores.    Past Medical History:  Diagnosis Date   Aortic aneurysm (HCC)    4646mt sinuses of Valsalva and 68m19mcending aorta by chest CTA 10/2019   Benign essential HTN 09/17/2014   CAD (coronary artery disease), native coronary artery 09/20/2014   30% LM at the ostium, 70-80% ostial D2, 30-40% prox LCX, 30-40% prox and 40% mid RCA and EF 40%. 11/07/17 DES to RCA    Chronic lower back pain    DCM (dilated cardiomyopathy) (HCC)[place]2/2016   a.  EF 40%;  b. Echo 5/16:  EF 40-45%, diff HK, inf AK, mild AI, MAC, severe LAE   Dilated aortic root (HCC) 09/17/2014   37m at SInus of Valsalva on echo 09/2018   DJD (degenerative joint disease)    feet, knees and back   GERD (gastroesophageal reflux disease)    hiatal hernia   Hepatitis 1953   "A or B; not sure which"   High cholesterol    History of blood transfusion 1954   "related to hip OR"   History of hip surgery 1954   "knocked hips out of joints; had to have bone grafts; both sides"   Hyperlipidemia LDL goal <70 10/14/2015   Lesion of left femoral nerve    3 cm MRI 04/2010 consistent with hemangioma   Mild aortic insufficiency    Obesity    OSA (obstructive sleep apnea) 10/14/2015    Mild with AHI 10.6/hr overall and 38/hr during REM sleep.  On CPAP at 9cm H2O   OSA on CPAP    Osteoarthritis    "all over" (11/07/2017)   PAF ([place]oxysmal [place] fibrillation) [place] 09/17/2014   PSA (psoriatic arthritis) (HCC)    Mild increase 3.65 on 05/2011 stable 3.68 on 09/2011   Renal cyst, left 2010   5 cm seen on ultrasound     Past Surgical History:  Procedure Laterality Date   APPENDECTOMY     BACK SURGERY     CARDIOVERSION N/A 11/04/2014   Procedure: CARDIOVERSION;  Surgeon: TSueanne Margarita MD;  [facility];  Service: Cardiovascular;  Laterality: N/A;   CARDIOVERSION N/A 01/09/2015   Procedure: CARDIOVERSION;  Surgeon: KPixie Casino MD;  [facility];  Service: Cardiovascular;  Laterality: N/A;   CATARACT EXTRACTION W/ INTRAOCULAR LENS  IMPLANT, BILATERAL Bilateral    CORONARY STENT INTERVENTION N/A 11/07/2017   Procedure: CORONARY STENT INTERVENTION;  Surgeon: HLeonie Man MD;  [facility];  Service: Cardiovascular;  Laterality: N/A;   HERNIA REPAIR     JOINT REPLACEMENT     LAPAROSCOPIC CHOLECYSTECTOMY     LEFT HEART CATHETERIZATION WITH CORONARY ANGIOGRAM N/A 10/01/2014   Procedure: LEFT HEART CATHETERIZATION WITH CORONARY ANGIOGRAM;  Surgeon: TTroy Sine MD;  [facility];  Service: Cardiovascular;  Laterality: N/A;   LUMBAR LAMINECTOMY     REVISION TOTAL HIP ARTHROPLASTY Right    RIGHT/LEFT HEART CATH AND CORONARY ANGIOGRAPHY N/A 11/07/2017   Procedure: RIGHT/LEFT HEART CATH AND CORONARY ANGIOGRAPHY;  Surgeon: HLeonie Man MD;  [facility];  Service: Cardiovascular;  Laterality: N/A;   SHOULDER OPEN ROTATOR CUFF REPAIR Bilateral    TONSILLECTOMY     TOTAL HIP ARTHROPLASTY Bilateral    TOTAL KNEE ARTHROPLASTY Bilateral    UMBILICAL HERNIA REPAIR      Current Medications: Current Meds  Medication Sig   acetaminophen (TYLENOL) 650 MG CR tablet Take 650 mg by mouth every 8 (eight) hours as  needed for pain.   cetirizine (ZYRTEC) 10 MG tablet Take 10 mg by mouth daily.   clobetasol (TEMOVATE) 0.05 % external solution Apply 1 application topically 2 (two) times daily.   clopidogrel (PLAVIX) 75 MG tablet TAKE ONE TABLET BY MOUTH EVERY MORNING   ELIQUIS 5 MG TABS tablet TAKE ONE TABLET BY MOUTH AT BREAKFAST AND AT BEDTIME   ezetimibe (ZETIA) 10 MG tablet Take 1 tablet (10 mg total) by mouth daily.   ketoconazole (NIZORAL) 2 % cream Apply topically 2 (two) times daily.   meloxicam (  MOBIC) 7.5 MG tablet Take 7.5 mg by mouth daily.   metoprolol succinate (TOPROL-XL) 25 MG 24 hr tablet TAKE ONE TABLET BY MOUTH EVERY MORNING   MULTIPLE VITAMINS PO Take 1 tablet by mouth daily.   pantoprazole (PROTONIX) 40 MG tablet Take 1 tablet (40 mg total) by mouth daily.   prednisoLONE acetate (PRED FORTE) 1 % ophthalmic suspension    rosuvastatin (CRESTOR) 40 MG tablet TAKE ONE TABLET BY MOUTH DAILY   sacubitril-valsartan (ENTRESTO) 49-51 MG Take 1 tablet by mouth 2 (two) times daily.   tamsulosin (FLOMAX) 0.4 MG CAPS capsule Take 0.4 mg by mouth daily.   torsemide (DEMADEX) 20 MG tablet Take 1 tablet (20 mg total) as needed for weight gain of 3 lbs in one day or 5 lbs in one week.   traMADol (ULTRAM) 50 MG tablet Take 50 mg by mouth every 6 (six) hours as needed for moderate pain.   triamcinolone cream (KENALOG) 0.1 % Apply topically 2 (two) times daily     Allergies:   Simvastatin, Codeine, Penicillin g, Codeine phosphate, and Penicillins   Social History   Socioeconomic History   Marital status: Married    Spouse name: Not on file   Number of children: 4   Years of education: Not on file   Highest education level: Not on file  Occupational History   Occupation: Naval architect  Tobacco Use   Smoking status: Former    Packs/day: 1.00    Years: 30.00    Total pack years: 30.00    Types: Cigarettes   Smokeless tobacco: Never   Tobacco comments:    "quit smoking in the 1980s"   Vaping Use   Vaping Use: Never used  Substance and Sexual Activity   Alcohol use: No   Drug use: No   Sexual activity: Yes  Other Topics Concern   Not on file  Social History Narrative   Not on file   Social Determinants of Health   Financial Resource Strain: Not on file  Food Insecurity: Not on file  Transportation Needs: Not on file  Physical Activity: Not on file  Stress: Not on file  Social Connections: Not on file     Family History: The patient's family history includes Atrial fibrillation in his sister; Cancer in his mother; Diabetes in his father; Heart attack in his maternal grandmother, paternal grandfather, and paternal grandmother; Heart disease in his sister; Stroke in his maternal grandfather.  ROS:   Please see the history of present illness.    ROS  All other systems reviewed and negative.   EKGs/Labs/Other Studies Reviewed:    The following studies were reviewed today: none  EKG:  EKG is not ordered today   Recent Labs: 03/30/2022: ALT 17; BUN 34; Creatinine, Ser 1.43; Hemoglobin 16.1; Platelets 175; Potassium 5.1; Sodium 139   Recent Lipid Panel    Component Value Date/Time   CHOL 149 10/20/2017 1220   TRIG 96 10/20/2017 1220   HDL 49 10/20/2017 1220   CHOLHDL 3.0 10/20/2017 1220   CHOLHDL 2.5 05/10/2016 1145   VLDL 19 05/10/2016 1145   LDLCALC 81 10/20/2017 1220    Physical Exam:    VS:  BP 120/62   Pulse 70   Ht 6' (1.829 m)   Wt 247 lb 3.2 oz (112.1 kg)   SpO2 96%   BMI 33.53 kg/m     Wt Readings from Last 3 Encounters:  10/01/22 247 lb 3.2 oz (112.1 kg)  03/30/22 240 lb (  108.9 kg)  03/05/22 231 lb (104.8 kg)    GEN: Well nourished, well developed in no acute distress HEENT: Normal NECK: No JVD; No carotid bruits LYMPHATICS: No lymphadenopathy CARDIAC:irregularly irregular, no murmurs, rubs, gallops RESPIRATORY:  Clear to auscultation without rales, wheezing or rhonchi  ABDOMEN: Soft, non-tender,  non-distended MUSCULOSKELETAL:  No edema; No deformity  SKIN: Warm and dry NEUROLOGIC:  Alert and oriented x 3 PSYCHIATRIC:  Normal affect  ASSESSMENT:    1. Permanent atrial fibrillation ([place])   2. Coronary artery disease involving native coronary artery of native heart with angina pectoris ([place])   3. DCM (dilated cardiomyopathy) ([place])   4. Chronic combined systolic and diastolic CHF (congestive heart failure) ([place])   5. Dilated aortic root ([place])   6. Benign essential HTN   7. OSA (obstructive sleep apnea)   8. Hyperlipidemia LDL goal <70    PLAN:    In order of problems listed above:  Permanent atrial fibrillation -He remains in atrial fibrillation heart rate is controlled -He has not had any bleeding problems on his DOAC -Continue prescription drug management with Eliquis 5 mg twice daily and Toprol-XL 25 mg daily with as needed refills -I have personally reviewed and interpreted outside labs performed by patient's PCP which showed hemoglobin 16.1 on 03/30/2022 and serum creatinine 1.32 and potassium 5.2 on 08/20/2022  ASCAD  -Cath showed an occluded RPDA which filled via left to right collaterals, 85% chronic stenosis of ostial D2, 99% mid RCA, 30% proximal to mid LAD, 30% proximal to distal left circumflex and moderate LV dysfunction with EF 25-35% echo showed EF 35 to 40%. -s/p PCI with drug-eluting stent to the mid RCA.     -He has not had any symptoms since I saw him last -no ASA due to DOAC -Continue with drug management with Plavix 75 mg daily, Toprol-XL 25 mg daily and Crestor 40 mg daily with as needed refills  Ischemic dilated cardiomyopathy/chronic combined systolic/diastolic CHF  -He is NYHA class II and denies any current symptoms -He is euvolemic and takes 2 to 3 tablets of torsemide a week -Echo 10/20/2017 showed EF 35 to 40% and improvement to 40-45% on echo 09/2018.  -Continue prescription drug management with torsemide 20 mg as needed for weight gain, Entresto  49-51 mg twice daily, Toprol-XL 25 mg daily with as needed refills -he has had problems with AKI with higher diuretic doses so will hold off on MRA -no SGLT2i due to hx of groin yeast infections  Dilated aortic root -dilated at sinuses of Valsalva at 45m by Chest CTA 10/2019 and 10/2020 -repeat chest CTA this month to reassess -Continue statin -BP controlled   HTN  -BP is controlled on -Continue prescription drug management Toprol-XL 25 mg daily, Entresto 49-51 mg twice daily with as needed refills   OSA - The patient is tolerating PAP therapy well without any problems. The PAP download performed by his DME was personally reviewed and interpreted by me today and showed an AHI of 13.1/hr, on auto CPAP from 4 to 15 cm H2O with 100% compliance in using more than 4 hours nightly.  The patient has been using and benefiting from PAP use and will continue to benefit from therapy.   Hyperlipidemia  -LDL goal < 70.   -Continue prescription drug with Crestor 40 mg daily and Zetia 10 mg daily with as needed refills -Check FLP and ALT   Followup in 1 year  Medication Adjustments/Labs and Tests Ordered: Current medicines are reviewed  at length with the patient today.  Concerns regarding medicines are outlined above.  No orders of the defined types were placed in this encounter.  No orders of the defined types were placed in this encounter.   Signed, Fransico Him, MD  10/01/2022 3:49 PM    [place]

## 2022-10-02 LAB — BASIC METABOLIC PANEL
BUN/Creatinine Ratio: 25 — ABNORMAL HIGH (ref 10–24)
BUN: 34 mg/dL — ABNORMAL HIGH (ref 8–27)
CO2: 21 mmol/L (ref 20–29)
Calcium: 9.3 mg/dL (ref 8.6–10.2)
Chloride: 104 mmol/L (ref 96–106)
Creatinine, Ser: 1.37 mg/dL — ABNORMAL HIGH (ref 0.76–1.27)
Glucose: 126 mg/dL — ABNORMAL HIGH (ref 70–99)
Potassium: 4.8 mmol/L (ref 3.5–5.2)
Sodium: 140 mmol/L (ref 134–144)
eGFR: 51 mL/min/{1.73_m2} — ABNORMAL LOW (ref >59)

## 2022-10-05 ENCOUNTER — Telehealth: Payer: Self-pay

## 2022-10-05 NOTE — Telephone Encounter (Signed)
-----   Message from Sueanne Margarita, MD sent at 10/02/2022  9:08 AM EST ----- Stable labs - continue current meds and forward to PCP

## 2022-10-05 NOTE — Telephone Encounter (Signed)
Results reviewed with patient who verbalized understanding of stable results and to continue current meds. Results forwarded to PCP.

## 2022-10-07 ENCOUNTER — Ambulatory Visit: Payer: PPO | Attending: Cardiology

## 2022-10-07 ENCOUNTER — Other Ambulatory Visit: Payer: PPO

## 2022-10-07 DIAGNOSIS — E785 Hyperlipidemia, unspecified: Secondary | ICD-10-CM

## 2022-10-07 LAB — LIPID PANEL
Chol/HDL Ratio: 2.3 ratio (ref 0.0–5.0)
Cholesterol, Total: 115 mg/dL (ref 100–199)
HDL: 51 mg/dL (ref >39)
LDL Chol Calc (NIH): 48 mg/dL (ref 0–99)
Triglycerides: 79 mg/dL (ref 0–149)
VLDL Cholesterol Cal: 16 mg/dL (ref 5–40)

## 2022-10-07 LAB — ALT: ALT: 21 IU/L (ref 0–44)

## 2022-10-08 ENCOUNTER — Telehealth: Payer: Self-pay

## 2022-10-08 NOTE — Telephone Encounter (Signed)
-----   Message from Sueanne Margarita, MD sent at 10/08/2022  8:06 AM EST ----- Lipids at goal continue current therapy and forward to PCP

## 2022-10-08 NOTE — Telephone Encounter (Signed)
Normal lab results reviewed with patient, patient verbalizes understanding to continue on current meds. Labs forwarded to PCP.

## 2022-10-26 ENCOUNTER — Ambulatory Visit (HOSPITAL_COMMUNITY)
Admission: RE | Admit: 2022-10-26 | Discharge: 2022-10-26 | Disposition: A | Discharge status: Home / Self Care | Payer: PPO | Source: Ambulatory Visit | Attending: Cardiology | Admitting: Cardiology

## 2022-10-26 DIAGNOSIS — I77810 Thoracic aortic ectasia: Secondary | ICD-10-CM | POA: Insufficient documentation

## 2022-10-26 MED ORDER — IOHEXOL 350 MG/ML SOLN
75.0000 mL | Freq: Once | INTRAVENOUS | Status: AC | PRN
  Administered 2022-10-26: 75 mL via INTRAVENOUS

## 2022-10-28 ENCOUNTER — Other Ambulatory Visit: Payer: PPO

## 2022-10-29 ENCOUNTER — Encounter: Payer: Self-pay | Admitting: Cardiology

## 2022-10-29 ENCOUNTER — Telehealth: Payer: Self-pay

## 2022-10-29 NOTE — Telephone Encounter (Signed)
-----   Message from Sueanne Margarita, MD sent at 10/29/2022  8:10 AM EDT ----- Chest CT showed dilated aorta at the sinus of Valsalva at 4.5 cm and ascending aorta.  There is coronary and aortic atherosclerosis which is a known diagnosis with history of CAD -no change in aortic dimensions compared to chest CT 10/2020.  Please repeat gated chest CTA in 1 year to follow-up on dilated aorta

## 2022-10-29 NOTE — Telephone Encounter (Signed)
Attempted 2x to call to give results, received busy signal.

## 2022-11-03 ENCOUNTER — Other Ambulatory Visit: Payer: Self-pay

## 2022-11-03 ENCOUNTER — Telehealth: Payer: Self-pay | Admitting: Cardiology

## 2022-11-03 DIAGNOSIS — I77810 Thoracic aortic ectasia: Secondary | ICD-10-CM

## 2022-11-03 NOTE — Telephone Encounter (Signed)
Follow Up:      Patient wants o know if his CT results are ready from 10-26-22?

## 2022-11-08 NOTE — Telephone Encounter (Signed)
Called and reviewed gated chest CT results with patient verbalizes understanding of dilated aorta- "I have had that for 8 years and I have seen Dr. Cyndia Bent for it." Patient agrees to follow up imaging, orders placed.

## 2022-12-16 ENCOUNTER — Other Ambulatory Visit: Payer: Self-pay | Admitting: Cardiology

## 2022-12-16 NOTE — Telephone Encounter (Signed)
Prescription refill request for Eliquis received. Indication: afib  Last office visit: [name], 10/01/2022 Scr: 1.37, 10/01/2022 Age: 88 yo  Weight: 112 kg   Refill sent.

## 2023-03-30 ENCOUNTER — Other Ambulatory Visit: Payer: Self-pay | Admitting: Cardiology

## 2023-04-25 ENCOUNTER — Other Ambulatory Visit (HOSPITAL_BASED_OUTPATIENT_CLINIC_OR_DEPARTMENT_OTHER): Payer: Self-pay | Admitting: Internal Medicine

## 2023-04-25 ENCOUNTER — Ambulatory Visit (HOSPITAL_BASED_OUTPATIENT_CLINIC_OR_DEPARTMENT_OTHER)
Admission: RE | Admit: 2023-04-25 | Discharge: 2023-04-25 | Disposition: A | Discharge status: Home / Self Care | Payer: PPO | Source: Ambulatory Visit | Attending: Internal Medicine | Admitting: Internal Medicine

## 2023-04-25 DIAGNOSIS — M7989 Other specified soft tissue disorders: Secondary | ICD-10-CM | POA: Diagnosis present

## 2023-05-15 ENCOUNTER — Other Ambulatory Visit: Payer: Self-pay | Admitting: Cardiology

## 2023-05-16 ENCOUNTER — Other Ambulatory Visit: Payer: Self-pay | Admitting: Cardiology

## 2023-05-17 NOTE — Telephone Encounter (Signed)
Prescription refill request for Eliquis received. Indication:afib Last office visit:2/24 Scr:1.37  2/24 Age: 88 Weight:112.1  kg  Prescription refilled

## 2023-05-19 ENCOUNTER — Other Ambulatory Visit (HOSPITAL_COMMUNITY): Payer: Self-pay

## 2023-05-20 ENCOUNTER — Telehealth: Payer: Self-pay | Admitting: Cardiology

## 2023-05-20 NOTE — Telephone Encounter (Signed)
Pt c/o BP issue: STAT if pt c/o blurred vision, one-sided weakness or slurred speech  1. What are your last 5 BP readings? 75/50; 88/60; 86/60  2. Are you having any other symptoms (ex. Dizziness, headache, blurred vision, passed out)? Real weak   3. What is your BP issue? Patient saw PCP and is experiencing hypotension.

## 2023-05-20 NOTE — Telephone Encounter (Addendum)
Spoke with pt re B/P  and weakness Per pt just seen PCP yesterday and was told to call Dr Mayford Knife and be seen Will forward message to Dr Mayford Knife for review and recommendations ./cy  Call mobile [phone]

## 2023-05-20 NOTE — Telephone Encounter (Signed)
Quintella Reichert, MD  to [facility] Triage    05/20/23  2:57 PM ECP adjusted his meds.  Please get him in in 1 week for an extender to evaluate his blood pressure on reduced dosage of Entresto  Called patient back about Dr. Norris Cross advisement. Patient will see Jacolyn Reedy PA on 05/31/23.

## 2023-05-27 NOTE — Progress Notes (Deleted)
Cardiology Office Note:  .   Date:  05/27/2023  ID:  [name], DOB 07-May-1935, MRN [id number] PCP: Merlene Laughter, MD (Inactive)  [facility] Providers Cardiologist:  Armanda Magic, MD { Click to update primary MD,subspecialty MD or APP then REFRESH:1}   History of Present Illness: .   [name] is a 88 y.o. male  male with a hx of  Permanent AF, dilated aortic root at (4.4 cm) and ascending aorta (4.0 cm) on most recent assessment. He has h/o systolic HF, ASCAD by cath with 30% LM at the ostium, 70-80% ostial D2, 30-40% prox LCX, 30-40% prox and 40% mid RCA and is on medical management.  He is on chronic anticoagulation therapy with Eliquis. He was previously on amiodarone for his afib, however this was discontinued 10/2016 due to intolerances (visual disturbances and bilateral hand numbness) and now has permanent atrial fibrillation.    2D echo 10/2017 showed a decline in EF with moderate reduced LVF with EF 35-40% and moderately dilated aortic root at 46 mm.  He underwent cath showing an occluded RPDA which filled via left to right collaterals, 85% chronic stenosis of oD2, 99% mid RCA, 30% proximal to mid LAD, 30% proximal to distal left circumflex and moderate LV dysfunction with EF 25-35%.  He underwent PCI with drug-eluting stent to the mid RCA.  He also was noted to have moderate pulmonary hypertension with an LVEDP of 29 and a pulmonary capillary wedge closure to 40.  It was felt that he likely had a combination of group 2 and group 3 pulmonary hypertension secondary to obstructive sleep apnea as well as congestive heart failure with pulmonary venous hypertension.  ROS: ***  Studies Reviewed: Marland Kitchen         Prior CV Studies: {Select studies to display:26339}  ***  Risk Assessment/Calculations:   {Does this patient have ATRIAL FIBRILLATION?:[phone]} No BP recorded.  {Refresh Note OR Click here to enter BP  :1}***       Physical Exam:   VS:  There were no vitals taken  for this visit.   Wt Readings from Last 3 Encounters:  10/01/22 247 lb 3.2 oz (112.1 kg)  03/30/22 240 lb (108.9 kg)  03/05/22 231 lb (104.8 kg)    GEN: Well nourished, well developed in no acute distress NECK: No JVD; No carotid bruits CARDIAC: ***RRR, no murmurs, rubs, gallops RESPIRATORY:  Clear to auscultation without rales, wheezing or rhonchi  ABDOMEN: Soft, non-tender, non-distended EXTREMITIES:  No edema; No deformity   ASSESSMENT AND PLAN: .    Permanent atrial fibrillation -He remains in atrial fibrillation heart rate is controlled-He has not had any bleeding problems on his DOAC -Continue  Eliquis 5 mg twice daily and Toprol-XL 25 mg daily   ASCAD  -Cath showed an occluded RPDA which filled via left to right collaterals, 85% chronic stenosis of ostial D2, 99% mid RCA, 30% proximal to mid LAD, 30% proximal to distal left circumflex and moderate LV dysfunction with EF 25-35% echo showed EF 35 to 40%. -s/p PCI with drug-eluting stent to the mid RCA.     -no ASA due to DOAC -Continue  Plavix 75 mg daily, Toprol-XL 25 mg daily and Crestor 40 mg daily    Ischemic dilated cardiomyopathy/chronic combined systolic/diastolic CHF  -He is NYHA class II and denies any current symptoms -He is euvolemic and takes 2 to 3 tablets of torsemide a week -Echo 10/20/2017 showed EF 35 to 40% and improvement to 40-45%  on echo 09/2018.  -Continue  torsemide 20 mg as needed for weight gain, Entresto 49-51 mg twice daily, Toprol-XL 25 mg daily with as needed refills -he has had problems with AKI with higher diuretic doses so will hold off on MRA -no SGLT2i due to hx of groin yeast infections   Dilated aortic root -dilated at sinuses of Valsalva at 46mm by Chest CTA 10/2019 and 10/2020 -repeat chest CTA this month to reassess -Continue statin -BP controlled   HTN  -BP is controlled on -Continue prescription drug management Toprol-XL 25 mg daily, Entresto 49-51 mg twice daily with as needed  refills    OSA - The patient is tolerating PAP therapy    Hyperlipidemia  -LDL goal < 70.   -Continueh Crestor 40 mg daily and Zetia 10 mg daily with as needed refills -Check FLP and ALT        {Are you ordering a CV Procedure (e.g. stress test, cath, DCCV, TEE, etc)?   Press F2        :[id number]}  Dispo: ***  Signed, Jacolyn Reedy, PA-C

## 2023-05-30 ENCOUNTER — Other Ambulatory Visit (HOSPITAL_COMMUNITY): Payer: Self-pay

## 2023-05-31 ENCOUNTER — Ambulatory Visit: Payer: PPO | Attending: Physician Assistant | Admitting: Physician Assistant

## 2023-06-01 ENCOUNTER — Encounter: Payer: Self-pay | Admitting: Physician Assistant

## 2023-06-02 ENCOUNTER — Ambulatory Visit: Payer: PPO | Attending: Physician Assistant | Admitting: Adult Health

## 2023-06-02 ENCOUNTER — Encounter: Payer: Self-pay | Admitting: Adult Health

## 2023-06-02 VITALS — BP 118/70 | HR 84 | Ht 72.0 in | Wt 245.0 lb

## 2023-06-02 DIAGNOSIS — I4821 Permanent atrial fibrillation: Secondary | ICD-10-CM

## 2023-06-02 MED ORDER — SACUBITRIL-VALSARTAN 24-26 MG PO TABS
1.0000 | ORAL_TABLET | Freq: Two times a day (BID) | ORAL | 3 refills | Status: AC
Start: 2023-06-02 — End: ?

## 2023-06-02 NOTE — Progress Notes (Signed)
Cardiology Office Note:  .   Date:  06/02/2023  ID:  [name], DOB 1935-04-02, MRN [id number] PCP: Emilio Aspen, MD  [facility] Providers Cardiologist:  Armanda Magic, MD {    History of Present Illness: .    [name] is a 88 y.o. male with history of atrial fibrillation, status post DCCV on 11/03/2014, dilated aortic root at 4.4 cm and ascending aorta 4.0 cm on most recent assessment, systolic heart failure, and CAD by cardiac catheterization with a 30% left main at the ostium 70% to 80% ostial D2, 30 to 40% proximal left circumflex, 34% proximal and 40% mid RCA on the medical therapy only.  He is on chronic anticoagulation with Eliquis for A-fib.  Most recent echocardiogram 10/06/2019. EF of 40-45% with mild to moderate diffuse LV dysfunction.  Last seen by Dr. Mayford Knife on 10/01/2018 form stable from a cardiac standpoint with no planned medication change or testing.  Of note the patient is not a candidate for SGLT 2 inhibitors due to chronic yeast infections.  He comes today with an agenda stating that the reason he is here is that his blood pressure is been too low, he is felt lethargic on Entresto, he stopped taking a.m. dose of Entresto has been taking nighttime dose only.  He states that is helped his blood pressure.  He reports that his blood pressures been as low as 108/60, causing him to feel very tired and lethargic.  Once he changed to only take the Entresto in the evening, his blood pressure has been better.  He continues to take torsemide 3 times a week for lower extremity edema.  He denies chest pain, palpitations, dyspnea on exertion, or near syncope.  ROS: Tiredness, lethargy, and fatigue on Entresto twice daily.  Studies Reviewed: .     Echocardiogram 09/26/2022  1. Mild to moderate global reduction in LV systolic function; mild LVH;  mild to moderate dilatation of aortic root (45 mm; suggest CTA or MRA to  further assess); mild AI, MR and TR.   2.  Left ventricular ejection fraction, by estimation, is 40 to 45%. The  left ventricle has mild to moderately decreased function. The left  ventrical demonstrates global hypokinesis. There is mildly increased left  ventricular hypertrophy. Left  ventricular diastolic function could not be evaluated.   3. Right ventricular systolic function is normal. The right ventricular  size is normal.   4. The mitral valve is normal in structure and function. Mild mitral  valve regurgitation. No evidence of mitral stenosis.   5. The aortic valve is tricuspid. Aortic valve regurgitation is mild.  Mild to moderate aortic valve sclerosis/calcification is present, without  any evidence of aortic stenosis.   6. Aortic dilatation noted. There is mild to moderate dilatation of the  aortic root measuring 45 mm.        EKG Interpretation Date/Time:  Thursday June 02 2023 15:22:34 EDT Ventricular Rate:  84 PR Interval:    QRS Duration:  146 QT Interval:  416 QTC Calculation: 491 R Axis:   53  Text Interpretation: Atrial fib with PVC's,  RBBB.  08-Nov-2017 04:08, Current undetermined rhythm precludes rhythm comparison, needs review Right bundle branch block is now Present  Confirmed by Joni Reining [id number]) on 06/02/2023 4:54:43 PM   Physical Exam:   VS:  BP 118/70 (BP Location: Left Arm, Patient Position: Sitting, Cuff Size: Normal)   Pulse 84   Ht 6' (1.829 m)   Wt  245 lb (111.1 kg)   SpO2 98%   BMI 33.23 kg/m    Wt Readings from Last 3 Encounters:  06/02/23 245 lb (111.1 kg)  10/01/22 247 lb 3.2 oz (112.1 kg)  03/30/22 240 lb (108.9 kg)    GEN: Well nourished, well developed in no acute distress NECK: No JVD; No carotid bruits CARDIAC: IRRR, 2/6 systolic murmurs, rubs, gallops RESPIRATORY:  Clear to auscultation without rales, wheezing or rhonchi  ABDOMEN: Soft, non-tender, non-distended EXTREMITIES:  No edema; No deformity   ASSESSMENT AND PLAN: .    Chronic combined CHF: Most  recent echocardiogram EF of 40 to 45%.  The patient has been on Entresto 49 to 51 mg twice daily.  The patient on his own decreased it to evening dose only due to hypotension.  He also continues on torsemide 3 times a week.            I have advised the patient that we will need to have 24-hour coverage on Entresto.  I will decrease the Entresto to 2426 mg twice daily.  A new prescription has been provided which has been printed.  He uses his primary care physician's office to assist him with drug assistance so that he will not have to pay for the medication.  He will take this prescription to his primary care physician's office pharmacist so that he may receive lower dose.  In the interim I will check a BMET and a CBC.  Commend repeat echocardiogram.  The patient wishes to wait and talk with Dr. Mayford Knife about this on follow-up.  2.  Coronary artery disease: Patient remains on clopidogrel despite having PCI in 2019.  Will recommend stopping this as his endothelialization has been completed.  PCI was not to LAD or left main but to the RCA.  Patient wishes to continue this until he sees Dr. Mayford Knife.  3.  OSA on CPAP: Reports compliance.  4.  Hypercholesterolemia: Remains on rosuvastatin 40 mg daily.  Labs are completed by PCP.  Goal of LDL less than 70.     Dispo: Follow-up with Dr. Mayford Knife January 2025 on previously scheduled appointment.  Signed, Bettey Mare. Liborio Nixon, ANP, AACC

## 2023-06-02 NOTE — Patient Instructions (Signed)
Medication Instructions:  Decrease Entresto to 24/26 mg ( 1 Tablet Twice Daily).\ Torsemide 20 mg ( Take As Needed). *If you need a refill on your cardiac medications before your next appointment, please call your pharmacy*   Lab Work: CBC, BMET Today If you have labs (blood work) drawn today and your tests are completely normal, you will receive your results only by: MyChart Message (if you have MyChart) OR A paper copy in the mail If you have any lab test that is abnormal or we need to change your treatment, we will call you to review the results.   Testing/Procedures: No Testing   Follow-Up: At [facility], you and your health needs are our priority.  As part of our continuing mission to provide you with exceptional heart care, we have created designated Provider Care Teams.  These Care Teams include your primary Cardiologist (physician) and Advanced Practice Providers (APPs -  Physician Assistants and Nurse Practitioners) who all work together to provide you with the care you need, when you need it.  We recommend signing up for the patient portal called "MyChart".  Sign up information is provided on this After Visit Summary.  MyChart is used to connect with patients for Virtual Visits (Telemedicine).  Patients are able to view lab/test results, encounter notes, upcoming appointments, etc.  Non-urgent messages can be sent to your provider as well.   To learn more about what you can do with MyChart, go to [email or web address].    Your next appointment:   Keep Scheduled Appointment  Provider:   Armanda Magic, MD

## 2023-06-03 ENCOUNTER — Telehealth: Payer: Self-pay

## 2023-06-03 LAB — CBC
Hematocrit: 48.7 % (ref 37.5–51.0)
Hemoglobin: 15.6 g/dL (ref 13.0–17.7)
MCH: 28 pg (ref 26.6–33.0)
MCHC: 32 g/dL (ref 31.5–35.7)
MCV: 87 fL (ref 79–97)
Platelets: 201 10*3/uL (ref 150–450)
RBC: 5.57 x10E6/uL (ref 4.14–5.80)
RDW: 14.3 % (ref 11.6–15.4)
WBC: 8.3 10*3/uL (ref 3.4–10.8)

## 2023-06-03 LAB — BASIC METABOLIC PANEL
BUN/Creatinine Ratio: 21 (ref 10–24)
BUN: 33 mg/dL — ABNORMAL HIGH (ref 8–27)
CO2: 24 mmol/L (ref 20–29)
Calcium: 9.7 mg/dL (ref 8.6–10.2)
Chloride: 100 mmol/L (ref 96–106)
Creatinine, Ser: 1.57 mg/dL — ABNORMAL HIGH (ref 0.76–1.27)
Glucose: 89 mg/dL (ref 70–99)
Potassium: 5.6 mmol/L — ABNORMAL HIGH (ref 3.5–5.2)
Sodium: 139 mmol/L (ref 134–144)
eGFR: 43 mL/min/{1.73_m2} — ABNORMAL LOW (ref >59)

## 2023-06-03 NOTE — Telephone Encounter (Addendum)
Called patient regarding results. Patient had understanding of results.----- Message from Joni Reining sent at 06/03/2023  6:49 AM EDT ----- Kidney function as worsened. He is to take Torsemide PRN only for lower extremity edema, decrease salt. Should take the new Rx for Entresto 24/26 mg BID that we printed for him to take to PCP for payment assistance.  Follow up with Dr.[name] in January as planned.KL

## 2023-06-06 ENCOUNTER — Other Ambulatory Visit (HOSPITAL_COMMUNITY): Payer: Self-pay

## 2023-06-07 ENCOUNTER — Other Ambulatory Visit (HOSPITAL_COMMUNITY): Payer: Self-pay

## 2023-06-08 ENCOUNTER — Other Ambulatory Visit: Payer: Self-pay

## 2023-06-08 ENCOUNTER — Other Ambulatory Visit (HOSPITAL_COMMUNITY): Payer: Self-pay

## 2023-06-08 MED ORDER — KETOCONAZOLE 2 % EX CREA
TOPICAL_CREAM | Freq: Two times a day (BID) | CUTANEOUS | 10 refills | Status: DC
  Filled 2023-09-13: qty 60, 30d supply, fill #0
  Filled 2023-11-16: qty 60, 30d supply, fill #1
  Filled 2023-12-27: qty 60, 30d supply, fill #2
  Filled 2024-02-06: qty 60, 30d supply, fill #3
  Filled 2024-02-18 – 2024-03-13 (×3): qty 60, 30d supply, fill #4

## 2023-06-08 MED ORDER — METOPROLOL SUCCINATE ER 25 MG PO TB24
25.0000 mg | ORAL_TABLET | Freq: Every morning | ORAL | 0 refills | Status: DC
  Filled 2023-06-08 – 2023-06-15 (×2): qty 30, 30d supply, fill #0
  Filled 2023-07-08: qty 30, 30d supply, fill #1
  Filled 2023-08-03 – 2023-09-06 (×5): qty 30, 30d supply, fill #2
  Filled ????-??-??: fill #2

## 2023-06-08 MED ORDER — TORSEMIDE 20 MG PO TABS
20.0000 mg | ORAL_TABLET | Freq: Every day | ORAL | 3 refills | Status: DC | PRN
  Filled 2023-06-08 – 2023-06-15 (×2): qty 30, 30d supply, fill #0
  Filled 2023-08-03 – 2023-08-08 (×2): qty 30, 30d supply, fill #1
  Filled ????-??-??: fill #1

## 2023-06-08 MED ORDER — PREDNISOLONE ACETATE 1 % OP SUSP
1.0000 [drp] | Freq: Every day | OPHTHALMIC | 0 refills | Status: AC
  Filled 2023-11-16: qty 10, 100d supply, fill #0

## 2023-06-08 MED ORDER — CLOPIDOGREL BISULFATE 75 MG PO TABS
75.0000 mg | ORAL_TABLET | Freq: Every morning | ORAL | 0 refills | Status: DC
  Filled 2023-06-08 – 2023-06-15 (×2): qty 30, 30d supply, fill #0
  Filled 2023-07-08: qty 30, 30d supply, fill #1
  Filled 2023-08-03 – 2023-09-06 (×4): qty 30, 30d supply, fill #2
  Filled ????-??-??: fill #2

## 2023-06-08 MED ORDER — TAMSULOSIN HCL 0.4 MG PO CAPS
0.4000 mg | ORAL_CAPSULE | Freq: Every day | ORAL | 2 refills | Status: DC
  Filled 2023-06-08 – 2023-06-15 (×2): qty 30, 30d supply, fill #0

## 2023-06-08 MED ORDER — APIXABAN 5 MG PO TABS
5.0000 mg | ORAL_TABLET | Freq: Two times a day (BID) | ORAL | 10 refills | Status: DC
  Filled 2023-06-08 – 2023-06-15 (×2): qty 60, 30d supply, fill #0
  Filled 2023-07-08: qty 60, 30d supply, fill #1
  Filled 2023-08-03 – 2023-09-06 (×5): qty 60, 30d supply, fill #2
  Filled 2023-09-26 – 2023-09-29 (×2): qty 60, 30d supply, fill #3
  Filled 2023-10-17 – 2023-10-26 (×3): qty 60, 30d supply, fill #4
  Filled 2023-11-04 – 2023-11-22 (×2): qty 60, 30d supply, fill #5
  Filled 2023-12-06 – 2023-12-20 (×3): qty 60, 30d supply, fill #6
  Filled 2024-01-04 – 2024-01-17 (×2): qty 60, 30d supply, fill #7
  Filled 2024-02-09 – 2024-02-14 (×2): qty 60, 30d supply, fill #8
  Filled 2024-03-15: qty 60, 30d supply, fill #9
  Filled 2024-04-12: qty 60, 30d supply, fill #10
  Filled ????-??-??: fill #2

## 2023-06-08 MED ORDER — MELOXICAM 7.5 MG PO TABS
7.5000 mg | ORAL_TABLET | Freq: Every morning | ORAL | 10 refills | Status: DC
  Filled 2023-06-08 – 2023-06-15 (×2): qty 30, 30d supply, fill #0
  Filled 2023-07-08: qty 30, 30d supply, fill #1
  Filled 2023-08-03 – 2023-09-06 (×5): qty 30, 30d supply, fill #2
  Filled 2023-09-26 – 2023-09-29 (×2): qty 30, 30d supply, fill #3
  Filled 2023-10-03: qty 30, 30d supply, fill #4
  Filled 2023-10-17 – 2023-10-26 (×3): qty 30, 30d supply, fill #5
  Filled 2023-11-04 – 2023-11-22 (×2): qty 30, 30d supply, fill #6
  Filled 2023-12-06 – 2023-12-20 (×2): qty 30, 30d supply, fill #7
  Filled 2024-01-04 – 2024-01-17 (×2): qty 30, 30d supply, fill #8
  Filled ????-??-??: fill #2

## 2023-06-08 MED ORDER — PANTOPRAZOLE SODIUM 40 MG PO TBEC
40.0000 mg | DELAYED_RELEASE_TABLET | Freq: Every morning | ORAL | 7 refills | Status: AC
  Filled 2023-06-08 – 2023-06-15 (×2): qty 30, 30d supply, fill #0
  Filled 2023-07-08: qty 30, 30d supply, fill #1
  Filled 2023-09-06: qty 30, 30d supply, fill #2

## 2023-06-08 MED ORDER — ROSUVASTATIN CALCIUM 40 MG PO TABS
40.0000 mg | ORAL_TABLET | Freq: Every day | ORAL | 0 refills | Status: DC
  Filled 2023-06-08 – 2023-06-15 (×2): qty 30, 30d supply, fill #0
  Filled 2023-07-08: qty 30, 30d supply, fill #1

## 2023-06-10 ENCOUNTER — Other Ambulatory Visit (HOSPITAL_COMMUNITY): Payer: Self-pay

## 2023-06-15 ENCOUNTER — Other Ambulatory Visit: Payer: Self-pay

## 2023-07-04 ENCOUNTER — Ambulatory Visit: Payer: Self-pay | Admitting: Podiatry

## 2023-07-06 ENCOUNTER — Other Ambulatory Visit (HOSPITAL_BASED_OUTPATIENT_CLINIC_OR_DEPARTMENT_OTHER): Payer: Self-pay

## 2023-07-06 MED ORDER — PANTOPRAZOLE SODIUM 40 MG PO TBEC
40.0000 mg | DELAYED_RELEASE_TABLET | Freq: Two times a day (BID) | ORAL | 0 refills | Status: DC
  Filled 2023-07-06 – 2023-08-29 (×5): qty 60, 30d supply, fill #0
  Filled ????-??-??: fill #0

## 2023-07-07 ENCOUNTER — Other Ambulatory Visit (HOSPITAL_BASED_OUTPATIENT_CLINIC_OR_DEPARTMENT_OTHER): Payer: Self-pay

## 2023-07-08 ENCOUNTER — Other Ambulatory Visit: Payer: Self-pay

## 2023-07-08 ENCOUNTER — Other Ambulatory Visit (HOSPITAL_COMMUNITY): Payer: Self-pay

## 2023-07-08 MED ORDER — TAMSULOSIN HCL 0.4 MG PO CAPS
0.4000 mg | ORAL_CAPSULE | Freq: Every day | ORAL | 2 refills | Status: DC
  Filled 2023-07-08: qty 30, 30d supply, fill #0
  Filled 2023-08-03 – 2023-08-11 (×3): qty 30, 30d supply, fill #1
  Filled 2023-08-29 – 2023-09-06 (×3): qty 30, 30d supply, fill #2
  Filled ????-??-??: fill #1

## 2023-07-12 ENCOUNTER — Other Ambulatory Visit: Payer: Self-pay

## 2023-08-03 ENCOUNTER — Other Ambulatory Visit: Payer: Self-pay

## 2023-08-03 ENCOUNTER — Other Ambulatory Visit (HOSPITAL_COMMUNITY): Payer: Self-pay

## 2023-08-03 MED ORDER — ROSUVASTATIN CALCIUM 40 MG PO TABS
40.0000 mg | ORAL_TABLET | Freq: Every day | ORAL | 3 refills | Status: DC
  Filled 2023-08-03: qty 90, 90d supply, fill #0
  Filled 2023-08-08 – 2023-08-29 (×2): qty 30, 30d supply, fill #0
  Filled 2023-09-06: qty 90, 90d supply, fill #0
  Filled 2023-09-06: qty 30, 30d supply, fill #0
  Filled 2023-09-26 – 2023-09-29 (×2): qty 30, 30d supply, fill #1
  Filled 2023-10-03: qty 30, 30d supply, fill #2
  Filled 2023-10-17 – 2023-10-26 (×3): qty 30, 30d supply, fill #3
  Filled 2023-11-04 – 2023-11-22 (×2): qty 30, 30d supply, fill #4
  Filled 2023-12-06 – 2023-12-20 (×2): qty 30, 30d supply, fill #5
  Filled 2024-01-04 – 2024-01-17 (×2): qty 30, 30d supply, fill #6
  Filled 2024-02-09 – 2024-02-14 (×2): qty 30, 30d supply, fill #7
  Filled 2024-03-15: qty 30, 30d supply, fill #8
  Filled 2024-04-12: qty 30, 30d supply, fill #9
  Filled 2024-05-15 – 2024-05-23 (×3): qty 30, 30d supply, fill #10
  Filled 2024-06-25: qty 30, 30d supply, fill #11
  Filled ????-??-??: fill #0

## 2023-08-08 ENCOUNTER — Other Ambulatory Visit (HOSPITAL_BASED_OUTPATIENT_CLINIC_OR_DEPARTMENT_OTHER): Payer: Self-pay

## 2023-08-08 ENCOUNTER — Other Ambulatory Visit: Payer: Self-pay

## 2023-08-08 ENCOUNTER — Other Ambulatory Visit (HOSPITAL_COMMUNITY): Payer: Self-pay

## 2023-08-09 ENCOUNTER — Other Ambulatory Visit: Payer: Self-pay

## 2023-08-11 ENCOUNTER — Other Ambulatory Visit: Payer: Self-pay

## 2023-08-11 ENCOUNTER — Other Ambulatory Visit (HOSPITAL_COMMUNITY): Payer: Self-pay

## 2023-08-12 ENCOUNTER — Other Ambulatory Visit (HOSPITAL_COMMUNITY): Payer: Self-pay

## 2023-08-15 ENCOUNTER — Other Ambulatory Visit: Payer: Self-pay

## 2023-08-24 ENCOUNTER — Other Ambulatory Visit: Payer: Self-pay

## 2023-08-26 ENCOUNTER — Other Ambulatory Visit: Payer: Self-pay

## 2023-08-29 ENCOUNTER — Other Ambulatory Visit: Payer: Self-pay

## 2023-09-02 ENCOUNTER — Other Ambulatory Visit: Payer: Self-pay

## 2023-09-02 ENCOUNTER — Ambulatory Visit: Payer: PPO | Admitting: Cardiology

## 2023-09-02 ENCOUNTER — Ambulatory Visit: Payer: PPO | Attending: Cardiology | Admitting: Cardiology

## 2023-09-02 ENCOUNTER — Other Ambulatory Visit (HOSPITAL_COMMUNITY): Payer: Self-pay

## 2023-09-02 ENCOUNTER — Encounter: Payer: Self-pay | Admitting: Cardiology

## 2023-09-02 VITALS — BP 124/68 | HR 48 | Ht 72.0 in | Wt 264.0 lb

## 2023-09-02 DIAGNOSIS — I5042 Chronic combined systolic (congestive) and diastolic (congestive) heart failure: Secondary | ICD-10-CM

## 2023-09-02 DIAGNOSIS — I420 Dilated cardiomyopathy: Secondary | ICD-10-CM | POA: Diagnosis not present

## 2023-09-02 DIAGNOSIS — R0602 Shortness of breath: Secondary | ICD-10-CM

## 2023-09-02 DIAGNOSIS — I77810 Thoracic aortic ectasia: Secondary | ICD-10-CM

## 2023-09-02 DIAGNOSIS — I25119 Atherosclerotic heart disease of native coronary artery with unspecified angina pectoris: Secondary | ICD-10-CM | POA: Diagnosis not present

## 2023-09-02 DIAGNOSIS — I4821 Permanent atrial fibrillation: Secondary | ICD-10-CM | POA: Diagnosis not present

## 2023-09-02 DIAGNOSIS — I10 Essential (primary) hypertension: Secondary | ICD-10-CM

## 2023-09-02 DIAGNOSIS — E785 Hyperlipidemia, unspecified: Secondary | ICD-10-CM

## 2023-09-02 MED ORDER — TORSEMIDE 20 MG PO TABS
20.0000 mg | ORAL_TABLET | Freq: Every day | ORAL | 3 refills | Status: DC
  Filled 2023-09-02: qty 90, 90d supply, fill #0
  Filled 2023-09-02: qty 30, 30d supply, fill #0
  Filled 2023-09-26 – 2023-09-28 (×2): qty 30, 30d supply, fill #1
  Filled ????-??-??: fill #0

## 2023-09-02 NOTE — Progress Notes (Signed)
Cardiology Office Note:    Date:  09/02/2023   ID:  [name], DOB [birth year], MRN [id number]  PCP:  Emilio Aspen, MD  Cardiologist:  Armanda Magic, MD    Referring MD: Emilio Aspen, *   Chief Complaint  Patient presents with   Coronary Artery Disease   Hypertension   Hyperlipidemia   Atrial Fibrillation   Sleep Apnea    History of Present Illness:    [name] is a 89 y.o. male with a hx of  PAF s/p DCCV 10/2014, dilated aortic root at (4.4 cm) and ascending aorta (4.0 cm) on most recent assessment. He has h/o systolic HF, ASCAD by cath with 30% LM at the ostium, 70-80% ostial D2, 30-40% prox LCX, 30-40% prox and 40% mid RCA and is on medical management.  He is on chronic anticoagulation therapy with Eliquis. He was previously on amiodarone for his afib, however this was discontinued 10/2016 due to intolerances (visual disturbances and bilateral hand numbness) and now has permanent atrial fibrillation.    2D echo 10/2017 showed a decline in EF with moderate reduced LVF with EF 35-40% and moderately dilated aortic root at 46 mm.  He underwent cath showing an occluded RPDA which filled via left to right collaterals, 85% chronic stenosis of oD2, 99% mid RCA, 30% proximal to mid LAD, 30% proximal to distal left circumflex and moderate LV dysfunction with EF 25-35%.  He underwent PCI with drug-eluting stent to the mid RCA.  He also was noted to have moderate pulmonary hypertension with an LVEDP of 29 and a pulmonary capillary wedge closure to 40.  It was felt that he likely had a combination of group 2 and group 3 pulmonary hypertension secondary to obstructive sleep apnea as well as congestive heart failure with pulmonary venous hypertension.  Repeat echo 09/2018 showed an improvement in LVF with E 40-45% with mild to moderate diffuse LV dysfunction and 41mm aortic root at sinuses of Valsalva.  Chest CTA the year before showed 44mm Sinus of Valsalva.    He is here today  for followup and is doing well.  He denies any chest pain or pressure, SOB, DOE, PND, orthopnea, LE edema, dizziness, palpitations or syncope. He is compliant with his meds and is tolerating meds with no SE.    He is doing well with his PAP device and thinks that he has gotten used to it.  He tolerates the mask and feels the pressure is adequate.  Since going on PAP He feels rested in the am and has no significant daytime sleepiness.  He denies any significant mouth or nasal dryness or nasal congestion.  He does not think that he snores.   Past Medical History:  Diagnosis Date   Acquired dilation of ascending aorta and aortic root (HCC)    4.5 cm at sinus of Valsalva and 4.0 cm at ascending aorta on CT 10/2022   Benign essential HTN 09/17/2014   CAD (coronary artery disease), native coronary artery 09/20/2014   30% LM at the ostium, 70-80% ostial D2, 30-40% prox LCX, 30-40% prox and 40% mid RCA and EF 40%. 11/07/17 DES to RCA    Chronic lower back pain    DCM (dilated cardiomyopathy) (HCC) 09/17/2014   a.  EF 40%; b. Echo 5/16:  EF 40-45%, diff HK, inf AK, mild AI, MAC, severe LAE   DJD (degenerative joint disease)    feet, knees and back   GERD (gastroesophageal reflux disease)  hiatal hernia   Hepatitis 1953   "A or B; not sure which"   History of blood transfusion 1954   "related to hip OR"   History of hip surgery 1954   "knocked hips out of joints; had to have bone grafts; both sides"   Hyperlipidemia LDL goal <70 10/14/2015   Lesion of left femoral nerve    3 cm MRI 04/2010 consistent with hemangioma   Mild aortic insufficiency    Obesity    OSA (obstructive sleep apnea) 10/14/2015   Mild with AHI 10.6/hr overall and 38/hr during REM sleep.  On CPAP at 9cm H2O   Osteoarthritis    "all over" (11/07/2017)   PAF (paroxysmal atrial fibrillation) (HCC) 09/17/2014   PSA (psoriatic arthritis) (HCC)    Mild increase 3.65 on 05/2011 stable 3.68 on 09/2011   Renal cyst, left 2010   5 cm  seen on ultrasound     Past Surgical History:  Procedure Laterality Date   APPENDECTOMY     BACK SURGERY     CARDIOVERSION N/A 11/04/2014   Procedure: CARDIOVERSION;  Surgeon: Quintella Reichert, MD;  Location: [facility] ENDOSCOPY;  Service: Cardiovascular;  Laterality: N/A;   CARDIOVERSION N/A 01/09/2015   Procedure: CARDIOVERSION;  Surgeon: Chrystie Nose, MD;  Location: [facility] ENDOSCOPY;  Service: Cardiovascular;  Laterality: N/A;   CATARACT EXTRACTION W/ INTRAOCULAR LENS  IMPLANT, BILATERAL Bilateral    CORONARY STENT INTERVENTION N/A 11/07/2017   Procedure: CORONARY STENT INTERVENTION;  Surgeon: Marykay Lex, MD;  Location: [facility] INVASIVE CV LAB;  Service: Cardiovascular;  Laterality: N/A;   HERNIA REPAIR     JOINT REPLACEMENT     LAPAROSCOPIC CHOLECYSTECTOMY     LEFT HEART CATHETERIZATION WITH CORONARY ANGIOGRAM N/A 10/01/2014   Procedure: LEFT HEART CATHETERIZATION WITH CORONARY ANGIOGRAM;  Surgeon: Lennette Bihari, MD;  Location: [facility] CATH LAB;  Service: Cardiovascular;  Laterality: N/A;   LUMBAR LAMINECTOMY     REVISION TOTAL HIP ARTHROPLASTY Right    RIGHT/LEFT HEART CATH AND CORONARY ANGIOGRAPHY N/A 11/07/2017   Procedure: RIGHT/LEFT HEART CATH AND CORONARY ANGIOGRAPHY;  Surgeon: Marykay Lex, MD;  Location: [facility] INVASIVE CV LAB;  Service: Cardiovascular;  Laterality: N/A;   SHOULDER OPEN ROTATOR CUFF REPAIR Bilateral    TONSILLECTOMY     TOTAL HIP ARTHROPLASTY Bilateral    TOTAL KNEE ARTHROPLASTY Bilateral    UMBILICAL HERNIA REPAIR      Current Medications: Current Meds  Medication Sig   acetaminophen (TYLENOL) 650 MG CR tablet Take 650 mg by mouth every 8 (eight) hours as needed for pain.   apixaban (ELIQUIS) 5 MG TABS tablet Take 1 tablet (5 mg total) by mouth in the morning and at bedtime.   cetirizine (ZYRTEC) 10 MG tablet Take 10 mg by mouth daily.   clobetasol (TEMOVATE) 0.05 % external solution Apply 1 application topically 2 (two) times daily.   clopidogrel (PLAVIX) 75 MG  tablet Take 1 tablet (75 mg total) by mouth every morning.   ezetimibe (ZETIA) 10 MG tablet Take 1 tablet (10 mg total) by mouth daily.   ketoconazole (NIZORAL) 2 % cream Apply to the affected area(s)topically 2 (two) times daily for 14 days   loratadine (CLARITIN) 10 MG tablet Take 10 mg by mouth daily.   meloxicam (MOBIC) 7.5 MG tablet Take 1 tablet (7.5 mg total) by mouth every morning.   metoprolol succinate (TOPROL-XL) 25 MG 24 hr tablet Take 1 tablet (25 mg total) by mouth every morning.   MULTIPLE VITAMINS  PO Take 1 tablet by mouth daily.   pantoprazole (PROTONIX) 40 MG tablet Take 1 tablet (40 mg total) by mouth every morning.   polyethylene glycol (MIRALAX / GLYCOLAX) 17 g packet Take 17 g by mouth daily.   prednisoLONE acetate (PRED FORTE) 1 % ophthalmic suspension Place 1 drop into both eyes daily.   rosuvastatin (CRESTOR) 40 MG tablet Take 1 tablet (40 mg total) by mouth at bedtime.   sacubitril-valsartan (ENTRESTO) 24-26 MG Take 1 tablet by mouth 2 (two) times daily.   tamsulosin (FLOMAX) 0.4 MG CAPS capsule Take 1 capsule (0.4 mg total) by mouth at bedtime.   torsemide (DEMADEX) 20 MG tablet Take 1 tablet (20 mg total) by mouth daily as needed for weight gain of 3 pounds in 1 day or 5 pounds in 1 week.   traMADol (ULTRAM) 50 MG tablet Take 50 mg by mouth every 6 (six) hours as needed for moderate pain.   triamcinolone cream (KENALOG) 0.1 % Apply topically 2 (two) times daily   [DISCONTINUED] clopidogrel (PLAVIX) 75 MG tablet TAKE 1 TABLET BY MOUTH EVERY MORNING     Allergies:   Simvastatin, Codeine, Penicillin g, Codeine phosphate, and Penicillins   Social History   Socioeconomic History   Marital status: Married    Spouse name: Not on file   Number of children: 4   Years of education: Not on file   Highest education level: Not on file  Occupational History   Occupation: Statistician  Tobacco Use   Smoking status: Former    Current packs/day: 1.00    Average  packs/day: 1 pack/day for 30.0 years (30.0 ttl pk-yrs)    Types: Cigarettes   Smokeless tobacco: Never   Tobacco comments:    "quit smoking in the 1980s"  Vaping Use   Vaping status: Never Used  Substance and Sexual Activity   Alcohol use: No   Drug use: No   Sexual activity: Yes  Other Topics Concern   Not on file  Social History Narrative   Not on file   Social Drivers of Health   Financial Resource Strain: Not on file  Food Insecurity: Not on file  Transportation Needs: Not on file  Physical Activity: Not on file  Stress: Not on file  Social Connections: Not on file     Family History: The patient's family history includes Atrial fibrillation in his sister; Cancer in his mother; Diabetes in his father; Heart attack in his maternal grandmother, paternal grandfather, and paternal grandmother; Heart disease in his sister; Stroke in his maternal grandfather.  ROS:   Please see the history of present illness.    ROS  All other systems reviewed and negative.   EKGs/Labs/Other Studies Reviewed:    The following studies were reviewed today: Recent Labs: 10/07/2022: ALT 21 06/02/2023: BUN 33; Creatinine, Ser 1.57; Hemoglobin 15.6; Platelets 201; Potassium 5.6; Sodium 139   Recent Lipid Panel    Component Value Date/Time   CHOL 115 10/07/2022 1042   TRIG 79 10/07/2022 1042   HDL 51 10/07/2022 1042   CHOLHDL 2.3 10/07/2022 1042   CHOLHDL 2.5 05/10/2016 1145   VLDL 19 05/10/2016 1145   LDLCALC 48 10/07/2022 1042    Physical Exam:    VS:  BP 124/68   Pulse (!) 48   Ht 6' (1.829 m)   Wt 264 lb (119.7 kg)   BMI 35.80 kg/m     Wt Readings from Last 3 Encounters:  09/02/23 264 lb (119.7 kg)  06/02/23 245 lb (111.1 kg)  10/01/22 247 lb 3.2 oz (112.1 kg)    GEN: Well nourished, well developed in no acute distress HEENT: Normal NECK: No JVD; No carotid bruits LYMPHATICS: No lymphadenopathy CARDIAC:RRR, no murmurs, rubs, gallops RESPIRATORY:  Clear to auscultation  without rales, wheezing or rhonchi  ABDOMEN: Soft, non-tender, non-distended MUSCULOSKELETAL:  1+BLE edema; No deformity  SKIN: Warm and dry NEUROLOGIC:  Alert and oriented x 3 PSYCHIATRIC:  Normal affect  ASSESSMENT:    1. Permanent atrial fibrillation (HCC)   2. Coronary artery disease involving native coronary artery of native heart with angina pectoris (HCC)   3. DCM (dilated cardiomyopathy) (HCC)   4. Chronic combined systolic and diastolic CHF (congestive heart failure) (HCC)   5. Dilated aortic root (HCC)   6. Benign essential HTN   7. Hyperlipidemia LDL goal <70     PLAN:    In order of problems listed above:  Permanent atrial fibrillation -HR well controlled in atrial fibrillation and denies any palptiations -He has not had any bleeding problems on his DOAC -continue prescription drug management with Apixaban 5mg  BID, Toprol XL 25mg  daily with PRN refills -I have personally reviewed and interpreted outside labs performed by patient's PCP which showed SCr 1.57, K+ 4.8 and Hbg 14.1  ASCAD  HLD -Cath showed an occluded RPDA which filled via left to right collaterals, 85% chronic stenosis of ostial D2, 99% mid RCA, 30% proximal to mid LAD, 30% proximal to distal left circumflex and moderate LV dysfunction with EF 25-35% echo showed EF 35 to 40%. -s/p PCI with drug-eluting stent to the mid RCA.     -he denies any chest pain since I saw him last but is having worsening SOB and concerned he may have progression of CAD -I will get a stress PET CT to rule out ischemia -Informed Consent   Shared Decision Making/Informed Consent The risks [chest pain, shortness of breath, cardiac arrhythmias, dizziness, blood pressure fluctuations, myocardial infarction, stroke/transient ischemic attack, nausea, vomiting, allergic reaction, radiation exposure, metallic taste sensation and life-threatening complications (estimated to be 1 in 10,000)], benefits (risk stratification, diagnosing coronary  artery disease, treatment guidance) and alternatives of a cardiac PET stress test were discussed in detail with Mr. Tara and he agrees to proceed.    -no ASA due to DOAC -continue prescription drug management with Crestor 40mg  daily, Toprol XL 25mg  daily with PRN refills -he is having a lot bleeding in his skin with any small amount of trauma so I have told him to stop the Plavix -check FLP and ALT  Ischemic dilated cardiomyopathy/chronic combined systolic/diastolic CHF  -He is NYHA class IIIa -he has been having problems with increased DOE, PND and orthopnea and his PCP has been adjusting his demadex -Echo 10/20/2017 showed EF 35 to 40% and improvement to 40-45% on echo 09/2018.  -I will repeat a 2D echo to reassess LVF -I have recommended that he take his Demadex 20mg  daily instead of QOD -continue prescription drug management with Toprol XL 25mg  daily Entresto 24-26mg  BID with PRN refills -he has had problems with AKI with higher diuretic doses so will hold off on MRA -no SGLT2i due to hx of groin yeast infections -check BMET and BNP in 1 week  Dilated aortic root -dilated at sinuses of Valsalva at 45mm and 41mm ascending aorta by Chest CTA 2024 -repeat chest CTA to reassess -Continue statin -BP controlled   HTN  -BP controlled on exam today -Continue BB and Entresto  OSA - The patient is tolerating PAP therapy well without any problems. The PAP download performed by his DME was personally reviewed and interpreted by me today and showed an AHI of 36/hr on auto CPAP from 4-15 cm H2O with 50% compliance in using more than 4 hours nightly.  The patient has been using and benefiting from PAP use and will continue to benefit from therapy.  -his AHI is way too high and I think he needs a BiPAP titration so will send back to the lab  Hyperlipidemia  -LDL goal < 70.   -Continue prescription drug with Crestor 40 mg daily and Zetia 10 mg daily with as needed refills -check FLP and  ALT  Followup in 1 year  Medication Adjustments/Labs and Tests Ordered: Current medicines are reviewed at length with the patient today.  Concerns regarding medicines are outlined above.  No orders of the defined types were placed in this encounter.  No orders of the defined types were placed in this encounter.   Signed, Armanda Magic, MD  09/02/2023 2:52 PM    [facility]

## 2023-09-02 NOTE — Addendum Note (Signed)
Addended by: Franchot Gallo on: 09/02/2023 03:16 PM   Modules accepted: Orders

## 2023-09-02 NOTE — Progress Notes (Unsigned)
Attestation for cardiac PET CT.

## 2023-09-02 NOTE — Patient Instructions (Signed)
Medication Instructions:  Your physician has recommended you make the following change in your medication:   1) CHANGE torsemide (Demadex) to 20 mg daily 2) STOP clopidogrel (Plavix)  *If you need a refill on your cardiac medications before your next appointment, please call your pharmacy*  Lab Work: In 1 week, at any [facility] location: fasting Lipid panel, CMET, BNP If you have labs (blood work) drawn today and your tests are completely normal, you will receive your results only by: MyChart Message (if you have MyChart) OR A paper copy in the mail If you have any lab test that is abnormal or we need to change your treatment, we will call you to review the results.  Testing/Procedures: Your physician has requested you have a cardiac PET CT stress test. This will be performed at [facility], and a staff member will call you to schedule this test.  Follow-Up: At [facility], you and your health needs are our priority.  As part of our continuing mission to provide you with exceptional heart care, we have created designated Provider Care Teams.  These Care Teams include your primary Cardiologist (physician) and Advanced Practice Providers (APPs -  Physician Assistants and Nurse Practitioners) who all work together to provide you with the care you need, when you need it.  We recommend signing up for the patient portal called "MyChart".  Sign up information is provided on this After Visit Summary.  MyChart is used to connect with patients for Virtual Visits (Telemedicine).  Patients are able to view lab/test results, encounter notes, upcoming appointments, etc.  Non-urgent messages can be sent to your provider as well.   To learn more about what you can do with MyChart, go to [email or web address].    Your next appointment:   6 week(s)  The format for your next appointment:   In Person  Provider:   Armanda Magic, MD {  Other Instructions    Please report to Radiology at the  [facility] Main Entrance 30 minutes early for your test.  [address] [place], [place] [address]                         OR   Please report to Radiology at [facility] Main Entrance, medical mall, 30 mins prior to your test.  [address]  [place], [place]  [phone]  How to Prepare for Your Cardiac PET/CT Stress Test:  Nothing to eat or drink, except water, 3 hours prior to arrival time.  NO caffeine/decaffeinated products, or chocolate 12 hours prior to arrival. (Please note decaffeinated beverages (teas/coffees) still contain caffeine).  If you have caffeine within 12 hours prior, the test will need to be rescheduled.  Medication instructions: Do not take erectile dysfunction medications for 72 hours prior to test (sildenafil, tadalafil) Do not take nitrates (isosorbide mononitrate, Ranexa) the day before or day of test Do not take tamsulosin the day before or morning of test Hold theophylline containing medications for 12 hours. Hold Dipyridamole 48 hours prior to the test.  Diabetic Preparation: If able to eat breakfast prior to 3 hour fasting, you may take all medications, including your insulin. Do not worry if you miss your breakfast dose of insulin - start at your next meal. If you do not eat prior to 3 hour fast-Hold all diabetes (oral and insulin) medications. Patients who wear a continuous glucose monitor MUST remove the device prior to scanning.  You may take your remaining medications with water.  NO perfume, cologne or lotion on chest or abdomen area. FEMALES - Please avoid wearing dresses to this appointment.  Total time is 1 to 2 hours; you may want to bring reading material for the waiting time.  IF YOU THINK YOU MAY BE PREGNANT, OR ARE NURSING PLEASE INFORM THE TECHNOLOGIST.  In preparation for your appointment, medication and supplies will be purchased.  Appointment availability is limited, so if you need to  cancel or reschedule, please call the Radiology Department at [phone] [facility]) OR [phone] [facility]) 24 hours in advance to avoid a cancellation fee of $100.00  What to Expect When you Arrive:  Once you arrive and check in for your appointment, you will be taken to a preparation room within the Radiology Department.  A technologist or Nurse will obtain your medical history, verify that you are correctly prepped for the exam, and explain the procedure.  Afterwards, an IV will be started in your arm and electrodes will be placed on your skin for EKG monitoring during the stress portion of the exam. Then you will be escorted to the PET/CT scanner.  There, staff will get you positioned on the scanner and obtain a blood pressure and EKG.  During the exam, you will continue to be connected to the EKG and blood pressure machines.  A small, safe amount of a radioactive tracer will be injected in your IV to obtain a series of pictures of your heart along with an injection of a stress agent.    After your Exam:  It is recommended that you eat a meal and drink a caffeinated beverage to counter act any effects of the stress agent.  Drink plenty of fluids for the remainder of the day and urinate frequently for the first couple of hours after the exam.  Your doctor will inform you of your test results within 7-10 business days.  For more information and frequently asked questions, please visit our website: [email or web address]  For questions about your test or how to prepare for your test, please call: Cardiac Imaging Nurse Navigators Office: [phone]

## 2023-09-06 ENCOUNTER — Other Ambulatory Visit: Payer: Self-pay

## 2023-09-06 ENCOUNTER — Other Ambulatory Visit (HOSPITAL_COMMUNITY): Payer: Self-pay

## 2023-09-09 ENCOUNTER — Other Ambulatory Visit: Payer: Self-pay

## 2023-09-12 ENCOUNTER — Telehealth: Payer: Self-pay | Admitting: *Deleted

## 2023-09-12 DIAGNOSIS — I25119 Atherosclerotic heart disease of native coronary artery with unspecified angina pectoris: Secondary | ICD-10-CM

## 2023-09-12 DIAGNOSIS — G4733 Obstructive sleep apnea (adult) (pediatric): Secondary | ICD-10-CM

## 2023-09-12 DIAGNOSIS — I10 Essential (primary) hypertension: Secondary | ICD-10-CM

## 2023-09-12 NOTE — Telephone Encounter (Signed)
-----   Message from Nurse Estelle Grumbles sent at 09/02/2023  3:32 PM EST ----- Regarding: BiPAP Titration Hi Nina,  Dr. Mayford Knife said Mr. [name] needs BiPAP titration ASAP.  Thank you, Danford Bad

## 2023-09-12 NOTE — Telephone Encounter (Signed)
Order placed for Bipap titration.  Prior Authorization for BIPAP TIT sent to [facility] via web portal. Tracking Number .Outpatient Authorization [id number] Dates: 09/12/2023 - 12/11/2023

## 2023-09-13 ENCOUNTER — Other Ambulatory Visit: Payer: Self-pay

## 2023-09-13 ENCOUNTER — Other Ambulatory Visit (HOSPITAL_COMMUNITY): Payer: Self-pay

## 2023-09-16 DIAGNOSIS — R0602 Shortness of breath: Secondary | ICD-10-CM | POA: Diagnosis not present

## 2023-09-16 DIAGNOSIS — I10 Essential (primary) hypertension: Secondary | ICD-10-CM | POA: Diagnosis not present

## 2023-09-16 DIAGNOSIS — E785 Hyperlipidemia, unspecified: Secondary | ICD-10-CM | POA: Diagnosis not present

## 2023-09-17 LAB — LIPID PANEL
Chol/HDL Ratio: 2.8 {ratio} (ref 0.0–5.0)
Cholesterol, Total: 118 mg/dL (ref 100–199)
HDL: 42 mg/dL (ref >39)
LDL Chol Calc (NIH): 56 mg/dL (ref 0–99)
Triglycerides: 108 mg/dL (ref 0–149)
VLDL Cholesterol Cal: 20 mg/dL (ref 5–40)

## 2023-09-17 LAB — COMPREHENSIVE METABOLIC PANEL
ALT: 16 [IU]/L (ref 0–44)
AST: 22 [IU]/L (ref 0–40)
Albumin: 4 g/dL (ref 3.7–4.7)
Alkaline Phosphatase: 68 [IU]/L (ref 44–121)
BUN/Creatinine Ratio: 24 (ref 10–24)
BUN: 38 mg/dL — ABNORMAL HIGH (ref 8–27)
Bilirubin Total: 0.5 mg/dL (ref 0.0–1.2)
CO2: 25 mmol/L (ref 20–29)
Calcium: 9.7 mg/dL (ref 8.6–10.2)
Chloride: 100 mmol/L (ref 96–106)
Creatinine, Ser: 1.59 mg/dL — ABNORMAL HIGH (ref 0.76–1.27)
Globulin, Total: 3.1 g/dL (ref 1.5–4.5)
Glucose: 94 mg/dL (ref 70–99)
Potassium: 4.8 mmol/L (ref 3.5–5.2)
Sodium: 139 mmol/L (ref 134–144)
Total Protein: 7.1 g/dL (ref 6.0–8.5)
eGFR: 42 mL/min/{1.73_m2} — ABNORMAL LOW (ref >59)

## 2023-09-17 LAB — PRO B NATRIURETIC PEPTIDE: NT-Pro BNP: 2103 pg/mL — ABNORMAL HIGH (ref 0–486)

## 2023-09-20 ENCOUNTER — Telehealth: Payer: Self-pay

## 2023-09-20 DIAGNOSIS — I5042 Chronic combined systolic (congestive) and diastolic (congestive) heart failure: Secondary | ICD-10-CM

## 2023-09-20 DIAGNOSIS — Z79899 Other long term (current) drug therapy: Secondary | ICD-10-CM

## 2023-09-20 NOTE — Telephone Encounter (Signed)
 Call to patient to advise that BNP was elevated and serum creatinine slightly elevated but stable over the past 2 years on recent labs. Per Dr. Shlomo,  Lipids look great. Patient states that his leg edema has improved with elevation and compression.   Patient denies that he is currently on lasix  (no lasix  on current med list) and states that he is not sure he needs to increase demadex  at this time, though he is willing to repeat bemet in 1 week due to elevated BNP. Patient responses forwarded to Dr. Shlomo.

## 2023-09-20 NOTE — Telephone Encounter (Signed)
-----   Message from [name] sent at 09/20/2023 10:01 AM EST ----- BNP was elevated and serum creatinine slightly elevated but stable over the past 2 years.  Lipids look great.  At last OV we increased his Lasix .  Please find out from patient if his lower extremity edema has improved.  I would like to increase Demadex  to 20 mg 2 tablets twice daily for 3 days then change to 40 mg daily and repeat bemet in 1 week due to elevated BNP

## 2023-09-23 NOTE — Telephone Encounter (Signed)
 Call to patient to discuss Dr. Dorine recommendations for increasing demadex . Patient states that per our last conversation, he increased his demadex  from 20 mg daily to 40 mg daily. He states he was afraid to increase it further but reports his leg swelling has improved. He agrees to continue taking 40 mg daily and to complete BMET on 09/27/23. Orders placed and released.

## 2023-09-23 NOTE — Addendum Note (Signed)
 Addended by: Cherylyn Cos on: 09/23/2023 05:15 PM   Modules accepted: Orders

## 2023-09-26 ENCOUNTER — Other Ambulatory Visit (HOSPITAL_COMMUNITY): Payer: Self-pay

## 2023-09-26 ENCOUNTER — Other Ambulatory Visit: Payer: Self-pay | Admitting: Cardiology

## 2023-09-26 ENCOUNTER — Other Ambulatory Visit: Payer: Self-pay

## 2023-09-26 MED ORDER — TAMSULOSIN HCL 0.4 MG PO CAPS
0.4000 mg | ORAL_CAPSULE | Freq: Every day | ORAL | 2 refills | Status: DC
  Filled 2023-09-26 – 2023-09-29 (×2): qty 30, 30d supply, fill #0
  Filled 2023-10-03: qty 30, 30d supply, fill #1
  Filled 2023-10-17 – 2023-10-26 (×3): qty 30, 30d supply, fill #2

## 2023-09-26 MED FILL — Metoprolol Succinate Tab ER 24HR 25 MG (Tartrate Equiv): ORAL | 90 days supply | Qty: 90 | Fill #0 | Status: CN

## 2023-09-27 DIAGNOSIS — Z79899 Other long term (current) drug therapy: Secondary | ICD-10-CM | POA: Diagnosis not present

## 2023-09-27 DIAGNOSIS — I5042 Chronic combined systolic (congestive) and diastolic (congestive) heart failure: Secondary | ICD-10-CM | POA: Diagnosis not present

## 2023-09-28 ENCOUNTER — Other Ambulatory Visit: Payer: Self-pay

## 2023-09-28 ENCOUNTER — Other Ambulatory Visit: Payer: Self-pay | Admitting: Cardiology

## 2023-09-28 ENCOUNTER — Telehealth: Payer: Self-pay | Admitting: Cardiology

## 2023-09-28 ENCOUNTER — Other Ambulatory Visit (HOSPITAL_COMMUNITY): Payer: Self-pay

## 2023-09-28 LAB — BASIC METABOLIC PANEL
BUN/Creatinine Ratio: 24 (ref 10–24)
BUN: 39 mg/dL — ABNORMAL HIGH (ref 8–27)
CO2: 25 mmol/L (ref 20–29)
Calcium: 10.1 mg/dL (ref 8.6–10.2)
Chloride: 98 mmol/L (ref 96–106)
Creatinine, Ser: 1.63 mg/dL — ABNORMAL HIGH (ref 0.76–1.27)
Glucose: 104 mg/dL — ABNORMAL HIGH (ref 70–99)
Potassium: 4.5 mmol/L (ref 3.5–5.2)
Sodium: 141 mmol/L (ref 134–144)
eGFR: 41 mL/min/{1.73_m2} — ABNORMAL LOW (ref >59)

## 2023-09-28 MED ORDER — TORSEMIDE 20 MG PO TABS
20.0000 mg | ORAL_TABLET | Freq: Every day | ORAL | 3 refills | Status: DC
  Filled 2023-09-28: qty 30, 30d supply, fill #0
  Filled 2023-09-28: qty 90, 90d supply, fill #0
  Filled ????-??-??: fill #0

## 2023-09-28 NOTE — Telephone Encounter (Signed)
Patient is requesting to speak with RN Alcario Drought in regard to the 09/20/23 phone encounter. Please advise.

## 2023-09-29 ENCOUNTER — Other Ambulatory Visit (HOSPITAL_COMMUNITY): Payer: Self-pay

## 2023-09-29 ENCOUNTER — Other Ambulatory Visit: Payer: Self-pay

## 2023-09-29 MED FILL — Metoprolol Succinate Tab ER 24HR 25 MG (Tartrate Equiv): ORAL | 90 days supply | Qty: 90 | Fill #0 | Status: AC

## 2023-09-30 ENCOUNTER — Other Ambulatory Visit: Payer: Self-pay

## 2023-09-30 ENCOUNTER — Other Ambulatory Visit (HOSPITAL_COMMUNITY): Payer: Self-pay

## 2023-09-30 NOTE — Telephone Encounter (Signed)
Call to patient to see  if he is having any shortness of breath or lower extremity edema as serum creatinine is somewhat stable but slightly increased from what it had been per Dr. Mayford Knife. Patient's torsemide dose was increased from 20 mg to 40 mg on 09/20/23.   Patient does endorse legs are still swollen. He endorses weight gain of 3-4 lbs over the last week. He denies SOB and states "I don't move well, so I'm not noticing that I'm short of breath with exertion because I'm not exerting myself". However, at times on the phone he sounded short of breath as he talked.   He states he has 3-4 days worth of torsemide left and is asking for a refill, however Creatinine remains elevated.  I offered patient DOD appt for 10/04/23 which he accepted. We also reviewed ED precautions; patient verbalizes understanding to call 911 if he becomes SOB at rest and it does not resolve or is accompanied by chest pain.

## 2023-09-30 NOTE — Telephone Encounter (Signed)
Patient is following up due to not hearing back from RN yet. He would also like to discuss lab results.please advise.

## 2023-09-30 NOTE — Telephone Encounter (Signed)
See 09/20/23 telephone encounter

## 2023-10-03 ENCOUNTER — Other Ambulatory Visit (HOSPITAL_COMMUNITY): Payer: Self-pay

## 2023-10-03 ENCOUNTER — Other Ambulatory Visit: Payer: Self-pay

## 2023-10-03 MED ORDER — TORSEMIDE 40 MG PO TABS: 40.0000 mg | ORAL_TABLET | Freq: Every day | ORAL | 3 refills | Status: DC

## 2023-10-03 MED FILL — Metoprolol Succinate Tab ER 24HR 25 MG (Tartrate Equiv): ORAL | 30 days supply | Qty: 30 | Fill #1 | Status: AC

## 2023-10-03 NOTE — Addendum Note (Signed)
Addended by: Luellen Pucker on: 10/03/2023 09:09 AM   Modules accepted: Orders

## 2023-10-03 NOTE — Telephone Encounter (Signed)
Call to patient to advise that Dr. Mayford Knife has reordered 40 mg daily. Patient verbalized understanding he is to keep DOD appt on 10/04/23.

## 2023-10-04 ENCOUNTER — Ambulatory Visit: Payer: PPO | Attending: Cardiology | Admitting: Cardiology

## 2023-10-04 ENCOUNTER — Encounter: Payer: Self-pay | Admitting: Cardiology

## 2023-10-04 VITALS — BP 119/58 | HR 91 | Resp 16 | Ht 72.0 in | Wt 267.4 lb

## 2023-10-04 DIAGNOSIS — I5021 Acute systolic (congestive) heart failure: Secondary | ICD-10-CM | POA: Diagnosis not present

## 2023-10-04 DIAGNOSIS — I4821 Permanent atrial fibrillation: Secondary | ICD-10-CM | POA: Diagnosis not present

## 2023-10-04 DIAGNOSIS — I25118 Atherosclerotic heart disease of native coronary artery with other forms of angina pectoris: Secondary | ICD-10-CM | POA: Diagnosis not present

## 2023-10-04 DIAGNOSIS — I10 Essential (primary) hypertension: Secondary | ICD-10-CM

## 2023-10-04 MED ORDER — EMPAGLIFLOZIN 10 MG PO TABS: 10.0000 mg | ORAL_TABLET | Freq: Every day | ORAL | Status: DC

## 2023-10-04 MED ORDER — EMPAGLIFLOZIN 10 MG PO TABS: 10.0000 mg | ORAL_TABLET | Freq: Every day | ORAL | 6 refills | Status: DC

## 2023-10-04 NOTE — Patient Instructions (Signed)
Medication Instructions:  Your physician has recommended you make the following change in your medication: Start Jardiance 10 mg by mouth daily   *If you need a refill on your cardiac medications before your next appointment, please call your pharmacy*   Lab Work: none If you have labs (blood work) drawn today and your tests are completely normal, you will receive your results only by: MyChart Message (if you have MyChart) OR A paper copy in the mail If you have any lab test that is abnormal or we need to change your treatment, we will call you to review the results.   Testing/Procedures: Your physician has requested that you have an echocardiogram. Echocardiography is a painless test that uses sound waves to create images of your heart. It provides your doctor with information about the size and shape of your heart and how well your heart's chambers and valves are working. This procedure takes approximately one hour. There are no restrictions for this procedure. Please do NOT wear cologne, perfume, aftershave, or lotions (deodorant is allowed). Please arrive 15 minutes prior to your appointment time.  Please note: We ask at that you not bring children with you during ultrasound (echo/ vascular) testing. Due to room size and safety concerns, children are not allowed in the ultrasound rooms during exams. Our front office staff cannot provide observation of children in our lobby area while testing is being conducted. An adult accompanying a patient to their appointment will only be allowed in the ultrasound room at the discretion of the ultrasound technician under special circumstances. We apologize for any inconvenience.    Follow-Up: At [facility], you and your health needs are our priority.  As part of our continuing mission to provide you with exceptional heart care, we have created designated Provider Care Teams.  These Care Teams include your primary Cardiologist (physician)  and Advanced Practice Providers (APPs -  Physician Assistants and Nurse Practitioners) who all work together to provide you with the care you need, when you need it.  We recommend signing up for the patient portal called "MyChart".  Sign up information is provided on this After Visit Summary.  MyChart is used to connect with patients for Virtual Visits (Telemedicine).  Patients are able to view lab/test results, encounter notes, upcoming appointments, etc.  Non-urgent messages can be sent to your provider as well.   To learn more about what you can do with MyChart, go to [email or web address].    Your next appointment:   2/27  Provider:   Micah Flesher, PA-C        Other Instructions

## 2023-10-04 NOTE — Progress Notes (Signed)
Cardiology Office Note:  .   Date:  10/04/2023  ID:  LAURIS SERVISS, DOB 1935/04/03, MRN [id number] PCP: Emilio Aspen, MD  [facility] Providers Cardiologist:  Armanda Magic, MD   History of Present Illness: .   JIYAN WALKOWSKI is a 89 y.o. 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  Past medical history significant for hypertension, moderate obesity, hypercholesterolemia, OSA on CPAP and stage IIIb chronic kidney disease.  He presents for acute visit for worsening dyspnea, leg edema and weight gain accompanied by his wife.  Discussed the use of AI scribe software for clinical note transcription with the patient, who gave verbal consent to proceed.  History of Present Illness   The patient, with a history of heart failure and kidney disease, presents with fluid retention and weight gain. He reports that despite taking torsemide 20mg  twice daily, his weight has increased by six to eight pounds over the past two weeks. The patient also reports swelling in his legs, despite wearing compression stockings. He denies any significant change in his weight since increasing the dose of torsemide.  The patient also reports shortness of breath, which began in September. He reports that his kidney function has remained relatively stable over the past year, despite the increased dose of torsemide. The patient also reports decreased physical activity due to two artificial hips and knees, which he believes may be contributing to his weight gain.  The patient also reports a history of yeast infections and is concerned about maintaining cleanliness in his groin area. He reports that he is often thirsty and may be consuming more fluids than he realizes. The patient denies any chest pain but reports occasional  indigestion.       Labs   Lab Results  Component Value Date   CHOL 118 09/16/2023   HDL 42 09/16/2023   LDLCALC 56 09/16/2023   TRIG 108 09/16/2023   CHOLHDL 2.8 09/16/2023   Lab Results  Component Value Date   NA 141 09/27/2023   K 4.5 09/27/2023   CO2 25 09/27/2023   GLUCOSE 104 (H) 09/27/2023   BUN 39 (H) 09/27/2023   CREATININE 1.63 (H) 09/27/2023   CALCIUM 10.1 09/27/2023   GFR 62.29 01/22/2015   EGFR 41 (L) 09/27/2023   GFRNONAA 48 (L) 03/24/2020      Latest Ref Rng & Units 09/27/2023    4:39 PM 09/16/2023   10:26 AM 06/02/2023    4:19 PM  BMP  Glucose 70 - 99 mg/dL 952  94  89   BUN 8 - 27 mg/dL 39  38  33   Creatinine 0.76 - 1.27 mg/dL 8.41  3.24  4.01   BUN/Creat Ratio 10 - 24 24  24  21    Sodium 134 - 144 mmol/L 141  139  139   Potassium 3.5 - 5.2 mmol/L 4.5  4.8  5.6   Chloride 96 - 106 mmol/L 98  100  100   CO2 20 - 29 mmol/L 25  25  24    Calcium 8.6 - 10.2 mg/dL 02.7  9.7  9.7       Latest Ref Rng & Units 06/02/2023    4:19 PM 03/30/2022    2:16 PM 05/04/2021    3:50 PM  CBC  WBC 3.4 - 10.8 x10E3/uL 8.3  8.9  9.9   Hemoglobin 13.0 - 17.7 g/dL 82.9  56.2  13.0   Hematocrit 37.5 - 51.0 % 48.7  50.0  48.4   Platelets 150 - 450 x10E3/uL 201  175  196    No results found for: "HGBA1C"  Lab Results  Component Value Date   TSH 1.810 09/01/2017    Review of Systems  Constitutional: Positive for weight gain.  Cardiovascular:  Positive for dyspnea on exertion and leg swelling. Negative for chest pain.   Physical Exam:   VS:  BP (!) 119/58 (BP Location: Left Arm, Patient Position: Sitting, Cuff Size: Normal)   Pulse 91   Resp 16   Ht 6' (1.829 m)   Wt 267 lb 6.4 oz (121.3 kg)   SpO2 97%   BMI 36.27 kg/m    Wt Readings from Last 3 Encounters:  10/04/23 267 lb 6.4 oz (121.3 kg)  09/02/23 264 lb (119.7 kg)  06/02/23 245 lb (111.1 kg)    Physical Exam Neck:     Vascular: Carotid bruit (bilateral) present. No JVD.  Cardiovascular:     Rate and  Rhythm: Normal rate. Rhythm irregular.     Pulses: Normal pulses and intact distal pulses.     Heart sounds: Heart sounds are distant. Murmur heard.     Early systolic murmur is present with a grade of 2/6 at the upper right sternal border.  Pulmonary:     Effort: Pulmonary effort is normal.     Breath sounds: Normal breath sounds.  Abdominal:     General: Bowel sounds are normal.     Palpations: Abdomen is soft.  Musculoskeletal:     Right lower leg: Edema (2-3+ below knee pitting edema) present.     Left lower leg: Edema (2-3+ below knee pitting edema) present.  Skin:    Capillary Refill: Capillary refill takes less than 2 seconds.    Studies Reviewed: Marland Kitchen    Coronary angiogram 11/07/2017:  STENT SYNERGY DES 4X20.     ECHOCARDIOGRAM LIMITED 09/27/2019  1. Mild to moderate global reduction in LV systolic function; mild LVH; mild to moderate dilatation of aortic root (45 mm; suggest CTA or MRA to further assess); mild AI, MR and TR. 2. Left ventricular ejection fraction, by estimation, is 40 to 45%. The left ventricle has mild to moderately decreased function. The left ventrical demonstrates global hypokinesis. There is mildly increased left ventricular hypertrophy. Left ventricular diastolic function could not be evaluated. 3. Right ventricular systolic function is normal. The right ventricular size is normal. 4. The mitral valve is normal in structure and function. Mild mitral valve regurgitation. No evidence of mitral stenosis. 5. The aortic valve is tricuspid. Aortic valve regurgitation is mild. Mild to moderate aortic valve sclerosis/calcification is present, without any evidence of aortic stenosis. 6. Aortic dilatation noted. 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  EKG:    EKG Interpretation Date/Time:  Tuesday October 04 2023 15:38:02 EST Ventricular Rate:  91 PR Interval:  190 QRS Duration:  110 QT Interval:  372 QTC Calculation: 457 R  Axis:   13  Text Interpretation: EKG 10/04/2023: Atrial fibrillation with controlled ventricular response at the rate of 91 bpm, normal axis, RBBB, frequent PVCs (fusion complexes in bigeminal pattern).  Nonspecific T abnormality.  Compared to 06/02/2023, frequent PVCs not present. Confirmed by Delrae Rend [phone]) on 10/04/2023 9:18:38 PM    Medications and allergies    Allergies  Allergen Reactions  Simvastatin Other (See Comments)    Muscle pain elevated CPK   Codeine Other (See Comments)   Penicillin G Other (See Comments)   Codeine Phosphate Other (See Comments)    constipation   Penicillins Rash    NOT AN ALLERGY PER PT, thinks the needle used dirty     Current Outpatient Medications:    acetaminophen (TYLENOL) 650 MG CR tablet, Take 650 mg by mouth every 8 (eight) hours as needed for pain., Disp: , Rfl:    apixaban (ELIQUIS) 5 MG TABS tablet, Take 1 tablet (5 mg total) by mouth in the morning and at bedtime., Disp: 60 tablet, Rfl: 10   cetirizine (ZYRTEC) 10 MG tablet, Take 10 mg by mouth daily., Disp: , Rfl:    clobetasol (TEMOVATE) 0.05 % external solution, Apply 1 application topically 2 (two) times daily., Disp: 50 mL, Rfl: 3   empagliflozin (JARDIANCE) 10 MG TABS tablet, Take 1 tablet (10 mg total) by mouth daily before breakfast., Disp: 30 tablet, Rfl: 6   empagliflozin (JARDIANCE) 10 MG TABS tablet, Take 1 tablet (10 mg total) by mouth daily before breakfast., Disp: , Rfl:    ketoconazole (NIZORAL) 2 % cream, Apply to the affected area(s)topically 2 (two) times daily for 14 days, Disp: 60 g, Rfl: 10   loratadine (CLARITIN) 10 MG tablet, Take 10 mg by mouth daily., Disp: , Rfl:    meloxicam (MOBIC) 7.5 MG tablet, Take 1 tablet (7.5 mg total) by mouth every morning., Disp: 30 tablet, Rfl: 10   metoprolol succinate (TOPROL-XL) 25 MG 24 hr tablet, Take 1 tablet (25 mg total) by mouth every morning., Disp: 90 tablet, Rfl: 3   MULTIPLE VITAMINS PO, Take 1 tablet by mouth  daily., Disp: , Rfl:    pantoprazole (PROTONIX) 40 MG tablet, Take 1 tablet (40 mg total) by mouth every morning., Disp: 30 tablet, Rfl: 7   polyethylene glycol (MIRALAX / GLYCOLAX) 17 g packet, Take 17 g by mouth daily., Disp: , Rfl:    prednisoLONE acetate (PRED FORTE) 1 % ophthalmic suspension, Place 1 drop into both eyes daily., Disp: 40 mL, Rfl: 0   rosuvastatin (CRESTOR) 40 MG tablet, Take 1 tablet (40 mg total) by mouth at bedtime., Disp: 90 tablet, Rfl: 3   sacubitril-valsartan (ENTRESTO) 24-26 MG, Take 1 tablet by mouth 2 (two) times daily., Disp: 60 tablet, Rfl: 3   tamsulosin (FLOMAX) 0.4 MG CAPS capsule, Take 1 capsule (0.4 mg total) by mouth at bedtime., Disp: 30 capsule, Rfl: 2   torsemide 40 MG TABS, Take 40 mg by mouth daily., Disp: 90 tablet, Rfl: 3   traMADol (ULTRAM) 50 MG tablet, Take 50 mg by mouth every 6 (six) hours as needed for moderate pain., Disp: , Rfl:    triamcinolone cream (KENALOG) 0.1 %, Apply topically 2 (two) times daily, Disp: 80 g, Rfl: 4   ASSESSMENT AND PLAN: .      ICD-10-CM   1. 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 Coronary artery disease of native artery of native heart with stable angina pectoris (HCC)  I25.118 ECHOCARDIOGRAM COMPLETE    3. Permanent atrial fibrillation (HCC)  I48.21 EKG 12-Lead    ECHOCARDIOGRAM COMPLETE    4. Benign essential HTN  I10       1. 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 Continue torsemide 20 mg twice daily, add Jardiance, reduce fluid intake, and monitor daily weight. An echocardiogram is scheduled, with a follow-up in 10 days. Recently was evaluated by Dr. Mayford Knife and torsemide was increased to twice daily, he has had excellent diuresis however no  response to overall fluid gain or weight change as patient does not appear to have followed strict rules regarding his diet and fluid restriction as well.  Daily weights, fluid restriction, salt restriction calorie restriction discussed with the patient.  In view of addition of Jardiance, he will continue with torsemide 20 mg daily as he has had excellent diuresis even with 1 dose, personal hygiene discussed in detail.  Will repeat echocardiogram to reevaluate his LV systolic function.  2. Coronary artery disease of native artery of native heart with stable angina pectoris (HCC) He has had occasional episodes of chest pain suggestive of angina, in view of recent acute exacerbation heart failure and recent recurrence of mild chest pain although described as very mild and no use sublingual nitroglycerin, stress test would be appropriate, has already been scheduled.  3. Permanent atrial fibrillation [facility]) Patient is appropriately on anticoagulation.  4. Benign essential HTN Blood pressure is well-controlled on Entresto 24/26 mg daily and metoprolol succinate 25 mg daily.  Blood pressure is soft hence unable to uptitrate these medications further.  Renal function reviewed, he does have stage IIIb chronic kidney disease.  London Pepper will offer him renal protection as well.  Hopefully he will not have any perianal or groin infections.  Fortunately he is not a diabetic.  He is presently 89 years of age, end-of-life discussions need to be addressed at some point.  Patient's wife present and all questions answered.  Strict dietary restriction and salt and fluid restriction discussed as well.  Daily weights discussed.   Signed,  Yates Decamp, MD, [facility] 10/04/2023, 9:19 PM [facility] [address] [place], [place] [address] Phone: [phone]. Fax:  [phone]

## 2023-10-05 ENCOUNTER — Ambulatory Visit: Payer: Self-pay | Admitting: Podiatry

## 2023-10-06 ENCOUNTER — Telehealth: Payer: Self-pay | Admitting: Cardiology

## 2023-10-06 MED ORDER — TORSEMIDE 20 MG PO TABS: 40.0000 mg | ORAL_TABLET | Freq: Every day | ORAL | 3 refills | Status: DC

## 2023-10-06 NOTE — Telephone Encounter (Signed)
Pt c/o medication issue:  1. Name of Medication: torsemide 40 MG TABS   2. How are you currently taking this medication (dosage and times per day)? As written   3. Are you having a reaction (difficulty breathing--STAT)? No   4. What is your medication issue? Pt called in statng the pharmacy does not make torsemide 40mg  tablets. He asked for a new rx to be sent for 20mg  twice daily to be sent in, please advise.

## 2023-10-06 NOTE — Telephone Encounter (Signed)
Pt called in stating that the pharmacy was not able to fill a prescription for 40 mg tablets of torsemide. The prescription was changed to 2 20 mg tablets to be taken once daily (total of 40 mg daily) and sent to the pharmacy. Pt was called and notified. Pt verbalized understanding.

## 2023-10-13 ENCOUNTER — Ambulatory Visit: Payer: PPO | Admitting: Physician Assistant

## 2023-10-17 ENCOUNTER — Other Ambulatory Visit: Payer: Self-pay

## 2023-10-17 MED FILL — Metoprolol Succinate Tab ER 24HR 25 MG (Tartrate Equiv): ORAL | 30 days supply | Qty: 30 | Fill #2 | Status: CN

## 2023-10-25 ENCOUNTER — Other Ambulatory Visit: Payer: Self-pay

## 2023-10-25 ENCOUNTER — Encounter (HOSPITAL_COMMUNITY): Admission: RE | Admit: 2023-10-25 | Payer: PPO | Source: Ambulatory Visit

## 2023-10-25 MED FILL — Metoprolol Succinate Tab ER 24HR 25 MG (Tartrate Equiv): ORAL | 30 days supply | Qty: 30 | Fill #2 | Status: CN

## 2023-10-26 ENCOUNTER — Other Ambulatory Visit: Payer: Self-pay

## 2023-10-26 MED FILL — Metoprolol Succinate Tab ER 24HR 25 MG (Tartrate Equiv): ORAL | 30 days supply | Qty: 30 | Fill #2 | Status: CN

## 2023-10-27 ENCOUNTER — Other Ambulatory Visit (HOSPITAL_COMMUNITY): Payer: Self-pay

## 2023-10-27 MED FILL — Metoprolol Succinate Tab ER 24HR 25 MG (Tartrate Equiv): ORAL | 30 days supply | Qty: 30 | Fill #2 | Status: CN

## 2023-10-28 ENCOUNTER — Other Ambulatory Visit: Payer: Self-pay

## 2023-10-31 ENCOUNTER — Ambulatory Visit (HOSPITAL_COMMUNITY): Payer: PPO | Attending: Cardiology

## 2023-10-31 DIAGNOSIS — I25118 Atherosclerotic heart disease of native coronary artery with other forms of angina pectoris: Secondary | ICD-10-CM | POA: Diagnosis not present

## 2023-10-31 DIAGNOSIS — I5021 Acute systolic (congestive) heart failure: Secondary | ICD-10-CM | POA: Insufficient documentation

## 2023-10-31 DIAGNOSIS — I4821 Permanent atrial fibrillation: Secondary | ICD-10-CM | POA: Diagnosis not present

## 2023-10-31 LAB — ECHOCARDIOGRAM COMPLETE
Area-P 1/2: 3.74 cm2
P 1/2 time: 314 ms
S' Lateral: 4 cm

## 2023-10-31 NOTE — Progress Notes (Signed)
 Very stable echo and ascending aortic aneurysm compared to 2021 echo. Will continue to monitor  history of aortic aneurysm measuring 4.5 cm, permanent atrial fibrillation, HFrEF improved on medical therapy now with HFmrEF, last echocardiogram in 2021 revealing 40 to 45% EF.

## 2023-11-01 ENCOUNTER — Telehealth (HOSPITAL_COMMUNITY): Payer: Self-pay | Admitting: *Deleted

## 2023-11-01 ENCOUNTER — Telehealth: Payer: Self-pay | Admitting: Cardiology

## 2023-11-01 NOTE — Telephone Encounter (Signed)
 Reaching out to patient to offer assistance regarding upcoming cardiac imaging study; pt verbalizes understanding of appt date/time, parking situation and where to check in, pre-test NPO status and verified current allergies; name and call back number provided for further questions should they arise  [name] Brick RN Navigator Cardiac Imaging [facility] [phone] office [phone] cell  Patient aware to avoid caffeine 12 hours prior to his cardiac PET appt.

## 2023-11-01 NOTE — Telephone Encounter (Signed)
 Patient called answering service stating that he was returning a call from the office

## 2023-11-01 NOTE — Telephone Encounter (Signed)
 Spoke with patient and he is aware of echo results. He verbalized understanding

## 2023-11-02 ENCOUNTER — Encounter (HOSPITAL_COMMUNITY)
Admission: RE | Admit: 2023-11-02 | Discharge: 2023-11-02 | Disposition: A | Discharge status: Home / Self Care | Source: Ambulatory Visit | Attending: Cardiology | Admitting: Cardiology

## 2023-11-02 ENCOUNTER — Encounter (HOSPITAL_COMMUNITY): Admission: RE | Admit: 2023-11-02 | Source: Ambulatory Visit

## 2023-11-02 DIAGNOSIS — R0602 Shortness of breath: Secondary | ICD-10-CM | POA: Insufficient documentation

## 2023-11-02 LAB — NM PET CT CARDIAC PERFUSION MULTI W/ABSOLUTE BLOODFLOW
Nuc Rest EF: 22 %
Nuc Stress EF: 23 %
Rest Nuclear Isotope Dose: 31.3 mCi
ST Depression (mm): 0 mm
Stress Nuclear Isotope Dose: 30.8 mCi
TID: 1.09

## 2023-11-02 MED ORDER — RUBIDIUM RB82 GENERATOR (RUBYFILL)
30.8000 | PACK | Freq: Once | INTRAVENOUS | Status: AC
  Administered 2023-11-02: 30.8 via INTRAVENOUS

## 2023-11-02 MED ORDER — REGADENOSON 0.4 MG/5ML IV SOLN
0.4000 mg | Freq: Once | INTRAVENOUS | Status: AC
  Administered 2023-11-02: 0.4 mg via INTRAVENOUS

## 2023-11-02 MED ORDER — RUBIDIUM RB82 GENERATOR (RUBYFILL)
30.8000 | PACK | Freq: Once | INTRAVENOUS | Status: AC
Start: 2023-11-02 — End: 2023-11-02
  Administered 2023-11-02: 30.8 via INTRAVENOUS

## 2023-11-02 MED ORDER — REGADENOSON 0.4 MG/5ML IV SOLN
INTRAVENOUS | Status: AC
  Filled 2023-11-02: qty 5

## 2023-11-03 ENCOUNTER — Other Ambulatory Visit: Payer: Self-pay

## 2023-11-03 DIAGNOSIS — I5042 Chronic combined systolic (congestive) and diastolic (congestive) heart failure: Secondary | ICD-10-CM

## 2023-11-03 DIAGNOSIS — R9439 Abnormal result of other cardiovascular function study: Secondary | ICD-10-CM

## 2023-11-04 ENCOUNTER — Other Ambulatory Visit: Payer: Self-pay

## 2023-11-04 ENCOUNTER — Other Ambulatory Visit (HOSPITAL_COMMUNITY): Payer: Self-pay

## 2023-11-04 MED ORDER — TAMSULOSIN HCL 0.4 MG PO CAPS
0.4000 mg | ORAL_CAPSULE | Freq: Every day | ORAL | 2 refills | Status: DC
  Filled 2023-11-23: qty 30, 30d supply, fill #0
  Filled 2023-12-06 – 2023-12-20 (×2): qty 30, 30d supply, fill #1
  Filled 2024-01-04 – 2024-01-17 (×2): qty 30, 30d supply, fill #2

## 2023-11-04 MED FILL — Metoprolol Succinate Tab ER 24HR 25 MG (Tartrate Equiv): ORAL | 30 days supply | Qty: 30 | Fill #2 | Status: CN

## 2023-11-15 ENCOUNTER — Other Ambulatory Visit (HOSPITAL_COMMUNITY)

## 2023-11-16 ENCOUNTER — Ambulatory Visit: Admitting: Podiatry

## 2023-11-16 ENCOUNTER — Encounter: Payer: Self-pay | Admitting: Podiatry

## 2023-11-16 ENCOUNTER — Other Ambulatory Visit (HOSPITAL_COMMUNITY): Payer: Self-pay

## 2023-11-16 ENCOUNTER — Encounter (INDEPENDENT_AMBULATORY_CARE_PROVIDER_SITE_OTHER): Payer: Self-pay | Admitting: Podiatry

## 2023-11-16 ENCOUNTER — Other Ambulatory Visit: Payer: Self-pay

## 2023-11-16 DIAGNOSIS — M79674 Pain in right toe(s): Secondary | ICD-10-CM

## 2023-11-16 DIAGNOSIS — M2142 Flat foot [pes planus] (acquired), left foot: Secondary | ICD-10-CM | POA: Diagnosis not present

## 2023-11-16 DIAGNOSIS — B351 Tinea unguium: Secondary | ICD-10-CM | POA: Diagnosis not present

## 2023-11-16 DIAGNOSIS — M2141 Flat foot [pes planus] (acquired), right foot: Secondary | ICD-10-CM | POA: Diagnosis not present

## 2023-11-16 DIAGNOSIS — M79675 Pain in left toe(s): Secondary | ICD-10-CM

## 2023-11-21 ENCOUNTER — Ambulatory Visit (HOSPITAL_BASED_OUTPATIENT_CLINIC_OR_DEPARTMENT_OTHER): Payer: PPO | Attending: Cardiology | Admitting: Cardiology

## 2023-11-21 ENCOUNTER — Encounter: Payer: Self-pay | Admitting: Podiatry

## 2023-11-21 DIAGNOSIS — I4891 Unspecified atrial fibrillation: Secondary | ICD-10-CM | POA: Insufficient documentation

## 2023-11-21 DIAGNOSIS — I10 Essential (primary) hypertension: Secondary | ICD-10-CM

## 2023-11-21 DIAGNOSIS — I25119 Atherosclerotic heart disease of native coronary artery with unspecified angina pectoris: Secondary | ICD-10-CM

## 2023-11-21 DIAGNOSIS — G4733 Obstructive sleep apnea (adult) (pediatric): Secondary | ICD-10-CM

## 2023-11-21 NOTE — Progress Notes (Signed)
 Subjective: [name] presents today referred by Emilio Aspen, MD for complaint of with chief concern of diabetes with elongated, thickened, painful, discolored toenails for months. Aggravating factor(s) include wearing enclosed shoe gear. Patient has tried his wife trimming his toenails, but they have become increasingly difficult to trim.  Chief Complaint  Patient presents with   RFc    Past Medical History:  Diagnosis Date   Acquired dilation of ascending aorta and aortic root (HCC)    4.5 cm at sinus of Valsalva and 4.0 cm at ascending aorta on CT 10/2022   Benign essential HTN 09/17/2014   CAD (coronary artery disease), native coronary artery 09/20/2014   30% LM at the ostium, 70-80% ostial D2, 30-40% prox LCX, 30-40% prox and 40% mid RCA and EF 40%. 11/07/17 DES to RCA    Chronic lower back pain    DCM (dilated cardiomyopathy) (HCC) 09/17/2014   a.  EF 40%; b. Echo 5/16:  EF 40-45%, diff HK, inf AK, mild AI, MAC, severe LAE   DJD (degenerative joint disease)    feet, knees and back   GERD (gastroesophageal reflux disease)    hiatal hernia   Hepatitis 1953   "A or B; not sure which"   History of blood transfusion 1954   "related to hip OR"   History of hip surgery 1954   "knocked hips out of joints; had to have bone grafts; both sides"   Hyperlipidemia LDL goal <70 10/14/2015   Lesion of left femoral nerve    3 cm MRI 04/2010 consistent with hemangioma   Mild aortic insufficiency    Obesity    OSA (obstructive sleep apnea) 10/14/2015   Mild with AHI 10.6/hr overall and 38/hr during REM sleep.  On CPAP at 9cm H2O   Osteoarthritis    "all over" (11/07/2017)   PAF (paroxysmal atrial fibrillation) (HCC) 09/17/2014   PSA (psoriatic arthritis) (HCC)    Mild increase 3.65 on 05/2011 stable 3.68 on 09/2011   Renal cyst, left 2010   5 cm seen on ultrasound      Patient Active Problem List   Diagnosis Date Noted   CAD S/P percutaneous coronary angioplasty  11/07/2017   Hyperlipidemia LDL goal <70 10/14/2015   OSA (obstructive sleep apnea) 10/14/2015   Chronic combined systolic and diastolic CHF (congestive heart failure) (HCC) 01/06/2015   Chronic anticoagulation-Eliquis 10/11/2014   Obesity (BMI 30-39.9) 10/11/2014   Abnormal cardiovascular stress test 09/26/2014   Coronary artery disease involving native coronary artery of native heart with angina pectoris (HCC) 09/20/2014   Persistent atrial fibrillation (HCC) 09/17/2014   Benign essential HTN 09/17/2014   Dilated aortic root (HCC) 09/17/2014   DCM (dilated cardiomyopathy) (HCC) 09/17/2014   SOB (shortness of breath) 09/17/2014     Past Surgical History:  Procedure Laterality Date   APPENDECTOMY     BACK SURGERY     CARDIOVERSION N/A 11/04/2014   Procedure: CARDIOVERSION;  Surgeon: Quintella Reichert, MD;  Location: [facility] ENDOSCOPY;  Service: Cardiovascular;  Laterality: N/A;   CARDIOVERSION N/A 01/09/2015   Procedure: CARDIOVERSION;  Surgeon: Chrystie Nose, MD;  Location: [facility] ENDOSCOPY;  Service: Cardiovascular;  Laterality: N/A;   CATARACT EXTRACTION W/ INTRAOCULAR LENS  IMPLANT, BILATERAL Bilateral    CORONARY STENT INTERVENTION N/A 11/07/2017   Procedure: CORONARY STENT INTERVENTION;  Surgeon: Marykay Lex, MD;  Location: [facility] INVASIVE CV LAB;  Service: Cardiovascular;  Laterality: N/A;   HERNIA REPAIR     JOINT REPLACEMENT  LAPAROSCOPIC CHOLECYSTECTOMY     LEFT HEART CATHETERIZATION WITH CORONARY ANGIOGRAM N/A 10/01/2014   Procedure: LEFT HEART CATHETERIZATION WITH CORONARY ANGIOGRAM;  Surgeon: Lennette Bihari, MD;  Location: [facility] CATH LAB;  Service: Cardiovascular;  Laterality: N/A;   LUMBAR LAMINECTOMY     REVISION TOTAL HIP ARTHROPLASTY Right    RIGHT/LEFT HEART CATH AND CORONARY ANGIOGRAPHY N/A 11/07/2017   Procedure: RIGHT/LEFT HEART CATH AND CORONARY ANGIOGRAPHY;  Surgeon: Marykay Lex, MD;  Location: [facility] INVASIVE CV LAB;  Service: Cardiovascular;  Laterality: N/A;   SHOULDER  OPEN ROTATOR CUFF REPAIR Bilateral    TONSILLECTOMY     TOTAL HIP ARTHROPLASTY Bilateral    TOTAL KNEE ARTHROPLASTY Bilateral    UMBILICAL HERNIA REPAIR       Current Outpatient Medications on File Prior to Visit  Medication Sig Dispense Refill   acetaminophen (TYLENOL) 650 MG CR tablet Take 650 mg by mouth every 8 (eight) hours as needed for pain.     apixaban (ELIQUIS) 5 MG TABS tablet Take 1 tablet (5 mg total) by mouth in the morning and at bedtime. 60 tablet 10   cetirizine (ZYRTEC) 10 MG tablet Take 10 mg by mouth daily.     clobetasol (TEMOVATE) 0.05 % external solution Apply 1 application topically 2 (two) times daily. 50 mL 3   empagliflozin (JARDIANCE) 10 MG TABS tablet Take 1 tablet (10 mg total) by mouth daily before breakfast. 30 tablet 6   ketoconazole (NIZORAL) 2 % cream Apply to the affected area(s)topically 2 (two) times daily for 14 days 60 g 10   loratadine (CLARITIN) 10 MG tablet Take 10 mg by mouth daily.     meloxicam (MOBIC) 7.5 MG tablet Take 1 tablet (7.5 mg total) by mouth every morning. 30 tablet 10   metoprolol succinate (TOPROL-XL) 25 MG 24 hr tablet Take 1 tablet (25 mg total) by mouth every morning. 90 tablet 3   MULTIPLE VITAMINS PO Take 1 tablet by mouth daily.     pantoprazole (PROTONIX) 40 MG tablet Take 1 tablet (40 mg total) by mouth every morning. 30 tablet 7   polyethylene glycol (MIRALAX / GLYCOLAX) 17 g packet Take 17 g by mouth daily.     prednisoLONE acetate (PRED FORTE) 1 % ophthalmic suspension Place 1 drop into both eyes daily. 40 mL 0   rosuvastatin (CRESTOR) 40 MG tablet Take 1 tablet (40 mg total) by mouth at bedtime. 90 tablet 3   sacubitril-valsartan (ENTRESTO) 24-26 MG Take 1 tablet by mouth 2 (two) times daily. 60 tablet 3   tamsulosin (FLOMAX) 0.4 MG CAPS capsule Take 1 capsule (0.4 mg total) by mouth at bedtime. 30 capsule 2   torsemide (DEMADEX) 20 MG tablet Take 2 tablets (40 mg total) by mouth daily. 180 tablet 3   traMADol (ULTRAM)  50 MG tablet Take 50 mg by mouth every 6 (six) hours as needed for moderate pain.     triamcinolone cream (KENALOG) 0.1 % Apply topically 2 (two) times daily 80 g 4   No current facility-administered medications on file prior to visit.     Allergies  Allergen Reactions   Simvastatin Other (See Comments)    Muscle pain elevated CPK   Codeine Other (See Comments)   Penicillin G Other (See Comments)   Codeine Phosphate Other (See Comments)    constipation   Penicillins Rash    NOT AN ALLERGY PER PT, thinks the needle used dirty     Social History   Occupational History  Occupation: Statistician  Tobacco Use   Smoking status: Former    Current packs/day: 1.00    Average packs/day: 1 pack/day for 30.0 years (30.0 ttl pk-yrs)    Types: Cigarettes   Smokeless tobacco: Never   Tobacco comments:    "quit smoking in the 1980s"  Vaping Use   Vaping status: Never Used  Substance and Sexual Activity   Alcohol use: No   Drug use: No   Sexual activity: Yes     Family History  Problem Relation Age of Onset   Cancer Mother    Diabetes Father    Heart disease Sister    Atrial fibrillation Sister    Heart attack Maternal Grandmother    Stroke Maternal Grandfather    Heart attack Paternal Grandmother    Heart attack Paternal Grandfather      Immunization History  Administered Date(s) Administered   Influenza Split 05/21/2013, 05/02/2014, 05/07/2014   Influenza, High Dose Seasonal PF 05/17/2018   PFIZER(Purple Top)SARS-COV-2 Vaccination 09/20/2019, 10/16/2019   Pneumococcal Conjugate-13 07/22/2014   Pneumococcal Polysaccharide-23 09/12/2017   Tdap 10/08/2016, 06/19/2018   Zoster Recombinant(Shingrix) 04/14/2018, 06/19/2018     Objective: There were no vitals filed for this visit.  EXAVIER LINA is a pleasant 89 y.o. male obese in NAD. AAO x 3.  Vascular Examination: Vascular status intact b/l with palpable pedal pulses. Pedal hair diminished b/l. CFT immediate b/l.  No edema. No pain with calf compression b/l. Skin temperature gradient WNL b/l. No ischemia or gangrene noted b/l LE. No cyanosis or clubbing noted b/l LE.  Neurological Examination: Sensation grossly intact b/l with 10 gram monofilament. Vibratory sensation intact b/l.   Dermatological Examination: Pedal skin with normal turgor, texture and tone b/l. Toenails 1-5 b/l thick, discolored, elongated with subungual debris and pain on dorsal palpation. No hyperkeratotic lesions noted b/l.   Musculoskeletal Examination: Muscle strength 5/5 to b/l LE. Pes planus deformity noted bilateral LE.  Radiographs: None  Assessment: 1. Pain due to onychomycosis of toenails of both feet   2. Pes planus of both feet     Plan: Patient was evaluated and treated. All patient's and/or POA's questions/concerns addressed on today's visit. Toenails 1-5 debrided in length and girth without incident. Continue soft, supportive shoe gear daily. Report any pedal injuries to medical professional. Call office if there are any questions/concerns. -Discussed treatment options for onychomycosis. Patient opted for toenail debridement only on today.  -Patient/POA to call should there be question/concern in the interim. Return in about 3 months (around 02/15/2024).  Freddie Breech, DPM      [facility] LOCATION: [address] [place] [address]                   Office [phone]   [facility] LOCATION: [address] [place], [place] [address] Office [phone]

## 2023-11-21 NOTE — Progress Notes (Signed)
 1. ERRONEOUS ENCOUNTER--DISREGARD    Patient is New, not established.

## 2023-11-22 ENCOUNTER — Other Ambulatory Visit (HOSPITAL_COMMUNITY): Payer: Self-pay

## 2023-11-22 MED FILL — Metoprolol Succinate Tab ER 24HR 25 MG (Tartrate Equiv): ORAL | 30 days supply | Qty: 30 | Fill #2 | Status: CN

## 2023-11-23 ENCOUNTER — Other Ambulatory Visit: Payer: Self-pay

## 2023-11-24 ENCOUNTER — Other Ambulatory Visit: Payer: Self-pay

## 2023-11-28 ENCOUNTER — Ambulatory Visit (HOSPITAL_BASED_OUTPATIENT_CLINIC_OR_DEPARTMENT_OTHER)
Admission: RE | Admit: 2023-11-28 | Discharge: 2023-11-28 | Disposition: A | Discharge status: Home / Self Care | Source: Ambulatory Visit | Attending: Cardiology | Admitting: Cardiology

## 2023-11-28 ENCOUNTER — Other Ambulatory Visit (HOSPITAL_COMMUNITY): Payer: Self-pay | Admitting: *Deleted

## 2023-11-28 ENCOUNTER — Ambulatory Visit (HOSPITAL_COMMUNITY)
Admission: RE | Admit: 2023-11-28 | Discharge: 2023-11-28 | Disposition: A | Discharge status: Home / Self Care | Source: Ambulatory Visit | Attending: Cardiology | Admitting: Cardiology

## 2023-11-28 ENCOUNTER — Encounter (HOSPITAL_COMMUNITY): Payer: Self-pay | Admitting: Cardiology

## 2023-11-28 VITALS — BP 108/68 | HR 75 | Wt 251.4 lb

## 2023-11-28 DIAGNOSIS — I4891 Unspecified atrial fibrillation: Secondary | ICD-10-CM | POA: Diagnosis not present

## 2023-11-28 DIAGNOSIS — R9439 Abnormal result of other cardiovascular function study: Secondary | ICD-10-CM

## 2023-11-28 DIAGNOSIS — I5042 Chronic combined systolic (congestive) and diastolic (congestive) heart failure: Secondary | ICD-10-CM | POA: Diagnosis not present

## 2023-11-28 DIAGNOSIS — I2510 Atherosclerotic heart disease of native coronary artery without angina pectoris: Secondary | ICD-10-CM | POA: Insufficient documentation

## 2023-11-28 DIAGNOSIS — E785 Hyperlipidemia, unspecified: Secondary | ICD-10-CM | POA: Insufficient documentation

## 2023-11-28 DIAGNOSIS — I429 Cardiomyopathy, unspecified: Secondary | ICD-10-CM | POA: Insufficient documentation

## 2023-11-28 DIAGNOSIS — I10 Essential (primary) hypertension: Secondary | ICD-10-CM | POA: Insufficient documentation

## 2023-11-28 DIAGNOSIS — I25119 Atherosclerotic heart disease of native coronary artery with unspecified angina pectoris: Secondary | ICD-10-CM

## 2023-11-28 DIAGNOSIS — I4821 Permanent atrial fibrillation: Secondary | ICD-10-CM

## 2023-11-28 DIAGNOSIS — G4730 Sleep apnea, unspecified: Secondary | ICD-10-CM | POA: Diagnosis not present

## 2023-11-28 DIAGNOSIS — I5032 Chronic diastolic (congestive) heart failure: Secondary | ICD-10-CM

## 2023-11-28 LAB — BASIC METABOLIC PANEL WITH GFR
Anion gap: 9 (ref 5–15)
BUN: 34 mg/dL — ABNORMAL HIGH (ref 8–23)
CO2: 26 mmol/L (ref 22–32)
Calcium: 9.4 mg/dL (ref 8.9–10.3)
Chloride: 101 mmol/L (ref 98–111)
Creatinine, Ser: 1.48 mg/dL — ABNORMAL HIGH (ref 0.61–1.24)
GFR, Estimated: 46 mL/min — ABNORMAL LOW (ref >60)
Glucose, Bld: 98 mg/dL (ref 70–99)
Potassium: 5.2 mmol/L — ABNORMAL HIGH (ref 3.5–5.1)
Sodium: 136 mmol/L (ref 135–145)

## 2023-11-28 LAB — ECHOCARDIOGRAM LIMITED

## 2023-11-28 LAB — BRAIN NATRIURETIC PEPTIDE: B Natriuretic Peptide: 427.6 pg/mL — ABNORMAL HIGH (ref 0.0–100.0)

## 2023-11-28 MED ORDER — PERFLUTREN LIPID MICROSPHERE
1.0000 mL | INTRAVENOUS | Status: DC | PRN
  Administered 2023-11-28: 3 mL via INTRAVENOUS

## 2023-11-28 MED ORDER — SPIRONOLACTONE 25 MG PO TABS: 12.5000 mg | ORAL_TABLET | Freq: Every day | ORAL | 3 refills | Status: DC

## 2023-11-28 NOTE — Patient Instructions (Signed)
 Great to see you today!!  Medication Changes:  START Spironolactone 12.5 mg (1/2 tab) Daily  INCREASE Torsemide to 80 mg (4 tabs) daily for 3-4 days then back to 40 mg (2 tabs) Daily  Lab Work:  Labs done today, we will call you for abnormal results  Your physician recommends that you return for lab work in: 1 week  Special Instructions // Education:  Do the following things EVERYDAY: Weigh yourself in the morning before breakfast. Write it down and keep it in a log. Take your medicines as prescribed Eat low salt foods--Limit salt (sodium) to 2000 mg per day.  Stay as active as you can everyday Limit all fluids for the day to less than 2 liters   Follow-Up in: 3-4 weeks   At the [facility], you and your health needs are our priority. We have a designated team specialized in the treatment of Heart Failure. This Care Team includes your primary Heart Failure Specialized Cardiologist (physician), Advanced Practice Providers (APPs- Physician Assistants and Nurse Practitioners), and Pharmacist who all work together to provide you with the care you need, when you need it.   You may see any of the following providers on your designated Care Team at your next follow up:  Dr. Jules Oar Dr. Peder Bourdon Dr. Alwin Baars Dr. Judyth Nunnery Nieves Bars, NP Ruddy Corral, [place] [facility] [place], [place] Dennise Fitz, NP [place] Lee, NP Luster Salters, PharmD   Please be sure to bring in all your medications bottles to every appointment.   Need to Contact Us :  If you have any questions or concerns before your next appointment please send us  a message through [place] or call our office at [phone].    TO LEAVE A MESSAGE FOR THE NURSE SELECT OPTION 2, PLEASE LEAVE A MESSAGE INCLUDING: YOUR NAME DATE OF BIRTH CALL BACK NUMBER REASON FOR CALL**this is important as we prioritize the call backs  YOU WILL RECEIVE A CALL BACK THE SAME DAY AS LONG AS  YOU CALL BEFORE 4:00 PM

## 2023-11-28 NOTE — Progress Notes (Addendum)
 [facility] NOTE  Referring Physician: Benedetta Bradley, *  Primary Care: Benedetta Bradley, MD Primary Cardiologist: Dr. Berry Bristol  CC: Heart failure with reduced EF  HPI: [name] is a 89 y.o. male with HFrEF, permanent atrial fibrillation, CAD (PCI to the RCA in 2017), CKD and hypertension presenting today to establish care. [name] has been followed by Dr. Berry Bristol & Dr. Micael Adas for several years now. He has had a stable LVEF of 45% for several years now. Patient had a TTE on 10/31/23 with LVEF of 45% followed by cardiac PET with EF of 25% on 11/02/23. He presents today for further evaluation.   Interval hx:  - He reports that he has been sleeping in a recliner for the past 3-4 weeks due to orthopnea.  - NYHA III functional status; he becomes short of breath with just walking up 3 steps  Activity level/exercise tolerance:  NYHA II Orthopnea:  Sleeps on recliner due to orthopnea Paroxysmal noctural dyspnea:  Yes, if laying flat Chest pain/pressure:  No Orthostatic lightheadedness:  No Palpitations:  No Lower extremity edema:  Yes, 2+ Presyncope/syncope:  No Cough:  Yes  Pertinent Family & social hx:  N/A   PHYSICAL EXAM: Vitals:   11/28/23 1448  BP: 108/68  Pulse: 75  SpO2: 97%   GENERAL: NAD Lungs- basilar crackles CARDIAC:  JVP: 8-9 cm          Normal rate with regular rhythm. No murmur.  Pulses 1-2+. 2+ edema.  ABDOMEN: Soft, non-tender, non-distended.  EXTREMITIES: Warm and well perfused.  NEUROLOGIC: No obvious FND   DATA REVIEW  ECG: 10/04/23: atrial fibrillation  As per my personal interpretation  ECHO: 10/31/23: LVEF 25% LVEF 40-45%; RV function is normal. Moderate MR as per my personal interpretation  CATH: 11/07/17:   RPDA lesion is 100% stenosed. -Fills via left to right collaterals as well as RPDA distal artery marginal Ost 2nd Diag lesion is 85% stenosed. chronic CULPRIT LESION: Mid RCA lesion is 99% stenosed. A  drug-eluting stent was successfully placed using a STENT SYNERGY DES 4X20. --> Postdilated to 4.7 mm Post intervention, there is a 0% residual stenosis. Mid LAD lesion is 30% stenosed. Prox LAD to Mid LAD lesion is 30% stenosed. Prox Cx to Dist Cx lesion is 30% stenosed with 30% stenosed side branch in Ost 1st Mrg. There is moderate to severe left ventricular systolic dysfunction. The left ventricular ejection fraction is 25-35% by visual estimate. LV end diastolic pressure is severely elevated. Hemodynamic findings consistent with moderate pulmonary hypertension. --> With EDP of 29, and PCWP of closer to 40, there is likely a combined component  CARDIAC PET: 11/02/23   Findings are consistent with no ischemia and no infarction. The study is high risk due to LV function.   LV perfusion is normal. There is no evidence of ischemia. There is no evidence of infarction.   Rest left ventricular function is abnormal. Rest global function is severely reduced. Rest EF: 22%. Stress left ventricular function is abnormal. Stress global function is severely reduced. Stress EF: 23%. End diastolic cavity size is severely enlarged. End systolic cavity size is severely enlarged.   Myocardial blood flow reserve is not reported in this patient due to technical or patient-specific concerns that affect accuracy. Poor Stress Curve   Coronary calcium  assessment not performed due to prior revascularization. Aortic atherosclerosis noted.  Mitral annular calcification and aortic valve calcification noted.   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 Will increase to 80mg  daily.  Vasodilators:Entresto  24/26mg  BID Beta-Blocker:currently on toprol  25mg   daily;  ZOX:WRUEA spironolactone  25mg  daily Cardiometabolic:jardiance  10mg  daily Devices therapies & Valvulopathies:not indicated Advanced therapies:not indicated  CAD - S/P PCI to the RCA in 2019 - followed by Dr. Berry Bristol - Will discuss case with Dr. Berry Bristol; negative PET in the setting of multivessel CAD  Permanent atrial fibrillation - Followed by Dr. Berry Bristol; previously failed DCCV - Continue   HTN - see above; repeat labs today  HLD - continue crestor  40mg  daily  CKD IIIB - repeat BMP/BNP  Ascending aortic aneurysm - stable at 4.5cm  I spent 50 minutes caring for this patient today including face to face time, ordering and reviewing labs, reviewing records noted above, reviewing imaging with patient, seeing the patient, documenting in the record, and arranging follow ups.   [name] [facility] Mechanical Circulatory Support

## 2023-11-29 ENCOUNTER — Telehealth (HOSPITAL_COMMUNITY): Payer: Self-pay | Admitting: *Deleted

## 2023-11-29 DIAGNOSIS — I5042 Chronic combined systolic (congestive) and diastolic (congestive) heart failure: Secondary | ICD-10-CM

## 2023-11-29 MED ORDER — SPIRONOLACTONE 25 MG PO TABS: 12.5000 mg | ORAL_TABLET | Freq: Every day | ORAL | 3 refills | Status: DC

## 2023-11-29 NOTE — Telephone Encounter (Signed)
 Called patient per Dr. Bruce Caper with following lab results and instructions:  "His potassium is high. Lets tell him to hold off on taking the spironolactone and also stop taking daily potassium supplementation for the next 4 days. Repeat labs next week."  Pt says he is not on daily potassium,but he will hold spiro. He confirmed lab appointment for next week on 12/05/23.

## 2023-12-05 ENCOUNTER — Ambulatory Visit (HOSPITAL_COMMUNITY)
Admission: RE | Admit: 2023-12-05 | Discharge: 2023-12-05 | Disposition: A | Discharge status: Home / Self Care | Source: Ambulatory Visit | Attending: Cardiology | Admitting: Cardiology

## 2023-12-05 DIAGNOSIS — I5042 Chronic combined systolic (congestive) and diastolic (congestive) heart failure: Secondary | ICD-10-CM | POA: Diagnosis not present

## 2023-12-05 LAB — BASIC METABOLIC PANEL WITH GFR
Anion gap: 10 (ref 5–15)
BUN: 45 mg/dL — ABNORMAL HIGH (ref 8–23)
CO2: 26 mmol/L (ref 22–32)
Calcium: 9.1 mg/dL (ref 8.9–10.3)
Chloride: 100 mmol/L (ref 98–111)
Creatinine, Ser: 1.68 mg/dL — ABNORMAL HIGH (ref 0.61–1.24)
GFR, Estimated: 39 mL/min — ABNORMAL LOW (ref >60)
Glucose, Bld: 113 mg/dL — ABNORMAL HIGH (ref 70–99)
Potassium: 4.4 mmol/L (ref 3.5–5.1)
Sodium: 136 mmol/L (ref 135–145)

## 2023-12-06 ENCOUNTER — Other Ambulatory Visit: Payer: Self-pay

## 2023-12-06 ENCOUNTER — Other Ambulatory Visit (HOSPITAL_COMMUNITY): Payer: Self-pay

## 2023-12-06 MED FILL — Metoprolol Succinate Tab ER 24HR 25 MG (Tartrate Equiv): ORAL | 30 days supply | Qty: 30 | Fill #2 | Status: CN

## 2023-12-08 ENCOUNTER — Other Ambulatory Visit: Payer: Self-pay

## 2023-12-12 ENCOUNTER — Other Ambulatory Visit (HOSPITAL_COMMUNITY): Payer: Self-pay

## 2023-12-13 ENCOUNTER — Other Ambulatory Visit (HOSPITAL_COMMUNITY): Payer: Self-pay

## 2023-12-14 NOTE — Procedures (Signed)
   [facility] [address] [place], [place] [address] Tel: [phone]   Fax: [phone]  Titration Interpretation  Patient Name:  [name], [name] Date:  11/21/2023 Referring Physician:  Jacqueline Matsu, Md  Indications for Polysomnography The patient is a 89 year-old Male who is 6' and weighs 250.0 lbs. His BMI equals 34.2.  A full night titration treatment study was performed.  No medication was taken.  No Data.   Polysomnogram Data A full night polysomnogram recorded the standard physiologic parameters including EEG, EOG, EMG, EKG, nasal and oral airflow.  Respiratory parameters of chest and abdominal movements were recorded with Respiratory Inductance Plethysmography belts.  Oxygen saturation was recorded by pulse oximetry.   Sleep Architecture The total recording time of the polysomnogram was 383.6 minutes.  The total sleep time was 206.0 minutes.  The patient spent 16.5% of total sleep time in Stage N1, 50.7% in Stage N2, 0.0% in Stages N3, and 32.8% in REM.  Sleep latency was 6.3 minutes.  REM latency was 66.5 minutes.  Sleep Efficiency was 53.7%.  Wake after Sleep Onset time was 171.0 minutes.  Titration Summary The patient was titrated at pressures ranging from 9/5 cm/H20 up to 19/15 cm/H20. The last pressure used in the study was 19/15 cm/H20.  Respiratory Events The polysomnogram revealed a presence of 22 obstructive, 28 central, and 7 mixed apneas resulting in an Apnea index of 16.6 events per hour.  There were 33 hypopneas (>=3% desaturation and/or arousal) resulting in an Apnea\Hypopnea Index (AHI >=3% desaturation and/or arousal) of 26.2 events per hour.  There were 17 hypopneas (>=4% desaturation) resulting in an Apnea\Hypopnea Index (AHI >=4% desaturation) of 21.6 events per hour.  There were 3 Respiratory Effort Related Arousals resulting in a RERA index of 0.9 events per hour. The Respiratory Disturbance Index is 27.1 events  per hour.  The snore index was 0 events per hour.  Mean oxygen saturation was 96.6%.  The lowest oxygen saturation during sleep was 89.0%.  Time spent <=88% oxygen saturation was 0 minutes.  Limb Activity There were 0 limb movements recorded.    Cardiac Summary The average pulse rate was 64.6 bpm.  The minimum pulse rate was 36.0 bpm while the maximum pulse rate was 97.0 bpm.  Cardiac rhythm was abnormal with atrial fibrillation and PVCs  Diagnosis:  Obstructive Sleep Apnea Atrial Fibrillation  Recommendations: Recommend a trial of ResMed BiPAP at 19/15cm H2O with heated humidity and Large [employer] Evora full face mask. The patient should be counseled on good sleep hygiene and avoid sleeping supine. The patient should avoid driving when sleepy Followup in Sleep Clinic in 6 weeks.   This study was personally reviewed and electronically signed by: Jacqueline Matsu, Md Accredited Board Certified in Sleep Medicine Date/Time: 12/14/2023 8:37PM

## 2023-12-15 ENCOUNTER — Other Ambulatory Visit (HOSPITAL_COMMUNITY): Payer: Self-pay

## 2023-12-16 ENCOUNTER — Telehealth (HOSPITAL_COMMUNITY): Payer: Self-pay

## 2023-12-16 ENCOUNTER — Other Ambulatory Visit (HOSPITAL_COMMUNITY): Payer: Self-pay

## 2023-12-16 NOTE — Progress Notes (Incomplete)
 ADVANCED HEART FAILURE CLINIC NOTE  Referring Physician: Benedetta Bradley, *  Primary Care: Benedetta Bradley, MD Primary Cardiologist: Dr. Berry Bristol  CC: Heart failure with reduced EF  HPI: [name] is a 89 y.o. male with HFrEF, permanent atrial fibrillation, CAD (PCI to the RCA in 2017), CKD and hypertension presenting today to establish care. Mr. Alvelo has been followed by Dr. Berry Bristol & Dr. Micael Adas for several years now. He has had a stable LVEF of 45% for several years now. Patient had a TTE on 10/31/23 with LVEF of 45% followed by cardiac PET with EF of 25% on 11/02/23. He presents today for further evaluation.   Interval hx:  - He reports that he has been sleeping in a recliner for the past 3-4 weeks due to orthopnea.  - NYHA III functional status; he becomes short of breath with just walking up 3 steps  Activity level/exercise tolerance:  NYHA II Orthopnea:  Sleeps on recliner due to orthopnea Paroxysmal noctural dyspnea:  Yes, if laying flat Chest pain/pressure:  No Orthostatic lightheadedness:  No Palpitations:  No Lower extremity edema:  Yes, 2+ Presyncope/syncope:  No Cough:  Yes  Pertinent Family & social hx:  N/A   PHYSICAL EXAM: There were no vitals filed for this visit.  GENERAL: NAD Lungs- basilar crackles CARDIAC:  JVP: 8-9 cm          Normal rate with regular rhythm. No murmur.  Pulses 1-2+. 2+ edema.  ABDOMEN: Soft, non-tender, non-distended.  EXTREMITIES: Warm and well perfused.  NEUROLOGIC: No obvious FND   DATA REVIEW  ECG: 10/04/23: atrial fibrillation  As per my personal interpretation  ECHO: 10/31/23: LVEF 25% LVEF 40-45%; RV function is normal. Moderate MR as per my personal interpretation  CATH: 11/07/17:   RPDA lesion is 100% stenosed. -Fills via left to right collaterals as well as RPDA distal artery marginal Ost 2nd Diag lesion is 85% stenosed. chronic CULPRIT LESION: Mid RCA lesion is 99% stenosed. A drug-eluting stent was  successfully placed using a STENT SYNERGY DES 4X20. --> Postdilated to 4.7 mm Post intervention, there is a 0% residual stenosis. Mid LAD lesion is 30% stenosed. Prox LAD to Mid LAD lesion is 30% stenosed. Prox Cx to Dist Cx lesion is 30% stenosed with 30% stenosed side branch in Ost 1st Mrg. There is moderate to severe left ventricular systolic dysfunction. The left ventricular ejection fraction is 25-35% by visual estimate. LV end diastolic pressure is severely elevated. Hemodynamic findings consistent with moderate pulmonary hypertension. --> With EDP of 29, and PCWP of closer to 40, there is likely a combined component  CARDIAC PET: 11/02/23   Findings are consistent with no ischemia and no infarction. The study is high risk due to LV function.   LV perfusion is normal. There is no evidence of ischemia. There is no evidence of infarction.   Rest left ventricular function is abnormal. Rest global function is severely reduced. Rest EF: 22%. Stress left ventricular function is abnormal. Stress global function is severely reduced. Stress EF: 23%. End diastolic cavity size is severely enlarged. End systolic cavity size is severely enlarged.   Myocardial blood flow reserve is not reported in this patient due to technical or patient-specific concerns that affect accuracy. Poor Stress Curve   Coronary calcium  assessment not performed due to prior revascularization. Aortic atherosclerosis noted.  Mitral annular calcification and aortic valve calcification noted.   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 Will increase to 80mg  daily.  Vasodilators:Entresto  24/26mg  BID Beta-Blocker:currently on toprol  25mg  daily;  JWJ:XBJYN  spironolactone  25mg  daily Cardiometabolic:jardiance  10mg  daily Devices therapies & Valvulopathies:not indicated Advanced therapies:not indicated  CAD - S/P PCI to the RCA in 2019 - followed by Dr. Berry Bristol - Will discuss case with Dr. Berry Bristol; negative PET in the setting of multivessel CAD  Permanent atrial fibrillation - Followed by Dr. Berry Bristol; previously failed DCCV - Continue   HTN - see above; repeat labs today  HLD - continue crestor  40mg  daily  CKD IIIB - repeat BMP/BNP  Ascending aortic aneurysm - stable at 4.5cm  I spent 50 minutes caring for this patient today including face to face time, ordering and reviewing labs, reviewing records noted above, reviewing imaging with patient, seeing the patient, documenting in the record, and arranging follow ups.   Aditya Sabharwal Advanced Heart Failure Mechanical Circulatory Support

## 2023-12-16 NOTE — Telephone Encounter (Signed)
 Called to confirm/remind patient of their appointment at the [facility] on 12/19/23.   Appointment:   [x] Confirmed  [] Left mess   [] No answer/No voice mail  [] VM Full/unable to leave message  [] Phone not in service  Patient reminded to bring all medications and/or complete list.  Confirmed patient has transportation. Gave directions, instructed to utilize valet parking.

## 2023-12-19 ENCOUNTER — Ambulatory Visit (HOSPITAL_COMMUNITY)
Admission: RE | Admit: 2023-12-19 | Discharge: 2023-12-19 | Disposition: A | Discharge status: Home / Self Care | Source: Ambulatory Visit | Attending: Family Medicine | Admitting: Family Medicine

## 2023-12-19 ENCOUNTER — Other Ambulatory Visit (HOSPITAL_COMMUNITY): Payer: Self-pay

## 2023-12-19 ENCOUNTER — Other Ambulatory Visit: Payer: Self-pay

## 2023-12-19 ENCOUNTER — Other Ambulatory Visit (HOSPITAL_COMMUNITY): Payer: Self-pay | Admitting: Cardiology

## 2023-12-19 ENCOUNTER — Inpatient Hospital Stay (HOSPITAL_COMMUNITY)
Admission: RE | Admit: 2023-12-19 | Discharge: 2023-12-19 | Disposition: A | Discharge status: Home / Self Care | Source: Ambulatory Visit | Attending: Cardiology

## 2023-12-19 ENCOUNTER — Encounter (HOSPITAL_COMMUNITY): Payer: Self-pay

## 2023-12-19 VITALS — BP 118/62 | HR 85 | Wt 250.2 lb

## 2023-12-19 DIAGNOSIS — I7121 Aneurysm of the ascending aorta, without rupture: Secondary | ICD-10-CM | POA: Insufficient documentation

## 2023-12-19 DIAGNOSIS — N183 Chronic kidney disease, stage 3 unspecified: Secondary | ICD-10-CM | POA: Diagnosis not present

## 2023-12-19 DIAGNOSIS — E785 Hyperlipidemia, unspecified: Secondary | ICD-10-CM

## 2023-12-19 DIAGNOSIS — K219 Gastro-esophageal reflux disease without esophagitis: Secondary | ICD-10-CM | POA: Diagnosis not present

## 2023-12-19 DIAGNOSIS — I493 Ventricular premature depolarization: Secondary | ICD-10-CM | POA: Diagnosis not present

## 2023-12-19 DIAGNOSIS — I25119 Atherosclerotic heart disease of native coronary artery with unspecified angina pectoris: Secondary | ICD-10-CM | POA: Diagnosis not present

## 2023-12-19 DIAGNOSIS — I130 Hypertensive heart and chronic kidney disease with heart failure and stage 1 through stage 4 chronic kidney disease, or unspecified chronic kidney disease: Secondary | ICD-10-CM | POA: Insufficient documentation

## 2023-12-19 DIAGNOSIS — I5022 Chronic systolic (congestive) heart failure: Secondary | ICD-10-CM

## 2023-12-19 DIAGNOSIS — Z7901 Long term (current) use of anticoagulants: Secondary | ICD-10-CM | POA: Insufficient documentation

## 2023-12-19 DIAGNOSIS — I2510 Atherosclerotic heart disease of native coronary artery without angina pectoris: Secondary | ICD-10-CM | POA: Diagnosis not present

## 2023-12-19 DIAGNOSIS — I10 Essential (primary) hypertension: Secondary | ICD-10-CM

## 2023-12-19 DIAGNOSIS — N1832 Chronic kidney disease, stage 3b: Secondary | ICD-10-CM | POA: Diagnosis not present

## 2023-12-19 DIAGNOSIS — Z79899 Other long term (current) drug therapy: Secondary | ICD-10-CM | POA: Insufficient documentation

## 2023-12-19 DIAGNOSIS — I4821 Permanent atrial fibrillation: Secondary | ICD-10-CM

## 2023-12-19 DIAGNOSIS — R0601 Orthopnea: Secondary | ICD-10-CM | POA: Diagnosis not present

## 2023-12-19 DIAGNOSIS — I429 Cardiomyopathy, unspecified: Secondary | ICD-10-CM | POA: Diagnosis not present

## 2023-12-19 DIAGNOSIS — Z955 Presence of coronary angioplasty implant and graft: Secondary | ICD-10-CM | POA: Diagnosis not present

## 2023-12-19 LAB — BASIC METABOLIC PANEL WITH GFR
Anion gap: 9 (ref 5–15)
BUN: 39 mg/dL — ABNORMAL HIGH (ref 8–23)
CO2: 28 mmol/L (ref 22–32)
Calcium: 9.6 mg/dL (ref 8.9–10.3)
Chloride: 102 mmol/L (ref 98–111)
Creatinine, Ser: 1.56 mg/dL — ABNORMAL HIGH (ref 0.61–1.24)
GFR, Estimated: 43 mL/min — ABNORMAL LOW (ref >60)
Glucose, Bld: 97 mg/dL (ref 70–99)
Potassium: 4.5 mmol/L (ref 3.5–5.1)
Sodium: 139 mmol/L (ref 135–145)

## 2023-12-19 LAB — MAGNESIUM: Magnesium: 2.4 mg/dL (ref 1.7–2.4)

## 2023-12-19 LAB — CBC
HCT: 45.1 % (ref 39.0–52.0)
Hemoglobin: 14.3 g/dL (ref 13.0–17.0)
MCH: 26.5 pg (ref 26.0–34.0)
MCHC: 31.7 g/dL (ref 30.0–36.0)
MCV: 83.5 fL (ref 80.0–100.0)
Platelets: 231 10*3/uL (ref 150–400)
RBC: 5.4 MIL/uL (ref 4.22–5.81)
RDW: 16 % — ABNORMAL HIGH (ref 11.5–15.5)
WBC: 10 10*3/uL (ref 4.0–10.5)
nRBC: 0 % (ref 0.0–0.2)

## 2023-12-19 LAB — TSH: TSH: 1.539 u[IU]/mL (ref 0.350–4.500)

## 2023-12-19 LAB — BRAIN NATRIURETIC PEPTIDE: B Natriuretic Peptide: 481.5 pg/mL — ABNORMAL HIGH (ref 0.0–100.0)

## 2023-12-19 MED ORDER — SPIRONOLACTONE 25 MG PO TABS
12.5000 mg | ORAL_TABLET | Freq: Every day | ORAL | 3 refills | Status: AC
Start: 2023-12-19 — End: 2024-06-19

## 2023-12-19 MED ORDER — TORSEMIDE 20 MG PO TABS: 60.0000 mg | ORAL_TABLET | Freq: Every day | ORAL | 3 refills | Status: AC

## 2023-12-19 NOTE — Progress Notes (Signed)
 ReDS Vest / Clip - 12/19/23 1500       ReDS Vest / Clip   Station Marker D    Ruler Value 34.5    ReDS Value Range Moderate volume overload    ReDS Actual Value 36

## 2023-12-19 NOTE — Patient Instructions (Addendum)
 Thank you for coming in today  If you had labs drawn today, any labs that are abnormal the clinic will call you No news is good news  Medications: INCREASE Torsemide  to 60 mg 3 tablets daily Start Spironolactone  12.5 mg 1/2 tablet daily  Your provider has recommended that  you wear a Zio Patch for 14 days.  This monitor will record your heart rhythm for our review.  IF you have any symptoms while wearing the monitor please press the button.  If you have any issues with the patch or you notice a red or orange light on it please call the company at [phone].  Once you remove the patch please mail it back to the company as soon as possible so we can get the results.   Follow up appointments: Your physician recommends that you return for lab work in: 1 week BMET Your physician recommends that you schedule a follow-up appointment in:  3 months With Dr. Bruce Caper Please call our office to schedule the follow-up appointment in June 2025 for August appointment.    Do the following things EVERYDAY: Weigh yourself in the morning before breakfast. Write it down and keep it in a log. Take your medicines as prescribed Eat low salt foods--Limit salt (sodium) to 2000 mg per day.  Stay as active as you can everyday Limit all fluids for the day to less than 2 liters   At the [facility], you and your health needs are our priority. As part of our continuing mission to provide you with exceptional heart care, we have created designated Provider Care Teams. These Care Teams include your primary Cardiologist (physician) and Advanced Practice Providers (APPs- Physician Assistants and Nurse Practitioners) who all work together to provide you with the care you need, when you need it.   You may see any of the following providers on your designated Care Team at your next follow up: Dr Jules Oar Dr Peder Bourdon Dr. Mimi Alt, NP Ruddy Corral, [place] [facility] [place], [place] Dennise Fitz, NP Luster Salters, PharmD   Please be sure to bring in all your medications bottles to every appointment.    Thank you for choosing [facility]  If you have any questions or concerns before your next appointment please send us  a message through [facility] or call our office at [phone].    TO LEAVE A MESSAGE FOR THE NURSE SELECT OPTION 2, PLEASE LEAVE A MESSAGE INCLUDING: YOUR NAME DATE OF BIRTH CALL BACK NUMBER REASON FOR CALL**this is important as we prioritize the call backs  YOU WILL RECEIVE A CALL BACK THE SAME DAY AS LONG AS YOU CALL BEFORE 4:00 PM

## 2023-12-20 ENCOUNTER — Other Ambulatory Visit: Payer: Self-pay

## 2023-12-20 DIAGNOSIS — K219 Gastro-esophageal reflux disease without esophagitis: Secondary | ICD-10-CM | POA: Diagnosis not present

## 2023-12-20 DIAGNOSIS — N401 Enlarged prostate with lower urinary tract symptoms: Secondary | ICD-10-CM | POA: Diagnosis not present

## 2023-12-20 DIAGNOSIS — I2510 Atherosclerotic heart disease of native coronary artery without angina pectoris: Secondary | ICD-10-CM | POA: Diagnosis not present

## 2023-12-20 DIAGNOSIS — E7800 Pure hypercholesterolemia, unspecified: Secondary | ICD-10-CM | POA: Diagnosis not present

## 2023-12-20 DIAGNOSIS — K9089 Other intestinal malabsorption: Secondary | ICD-10-CM | POA: Diagnosis not present

## 2023-12-20 DIAGNOSIS — I509 Heart failure, unspecified: Secondary | ICD-10-CM | POA: Diagnosis not present

## 2023-12-20 DIAGNOSIS — Z0000 Encounter for general adult medical examination without abnormal findings: Secondary | ICD-10-CM | POA: Diagnosis not present

## 2023-12-20 DIAGNOSIS — I872 Venous insufficiency (chronic) (peripheral): Secondary | ICD-10-CM | POA: Diagnosis not present

## 2023-12-20 DIAGNOSIS — I7122 Aneurysm of the aortic arch, without rupture: Secondary | ICD-10-CM | POA: Diagnosis not present

## 2023-12-20 DIAGNOSIS — D6869 Other thrombophilia: Secondary | ICD-10-CM | POA: Diagnosis not present

## 2023-12-20 DIAGNOSIS — I480 Paroxysmal atrial fibrillation: Secondary | ICD-10-CM | POA: Diagnosis not present

## 2023-12-20 DIAGNOSIS — Z1331 Encounter for screening for depression: Secondary | ICD-10-CM | POA: Diagnosis not present

## 2023-12-20 DIAGNOSIS — M542 Cervicalgia: Secondary | ICD-10-CM | POA: Diagnosis not present

## 2023-12-20 MED FILL — Metoprolol Succinate Tab ER 24HR 25 MG (Tartrate Equiv): ORAL | 30 days supply | Qty: 30 | Fill #2 | Status: AC

## 2023-12-21 ENCOUNTER — Other Ambulatory Visit: Payer: Self-pay

## 2023-12-26 ENCOUNTER — Other Ambulatory Visit: Payer: Self-pay

## 2023-12-27 ENCOUNTER — Other Ambulatory Visit (HOSPITAL_COMMUNITY): Payer: Self-pay

## 2023-12-27 ENCOUNTER — Other Ambulatory Visit: Payer: Self-pay

## 2023-12-28 ENCOUNTER — Ambulatory Visit (HOSPITAL_COMMUNITY): Payer: Self-pay | Admitting: Family Medicine

## 2023-12-28 ENCOUNTER — Other Ambulatory Visit (HOSPITAL_COMMUNITY)

## 2023-12-28 ENCOUNTER — Ambulatory Visit (HOSPITAL_COMMUNITY)
Admission: RE | Admit: 2023-12-28 | Discharge: 2023-12-28 | Disposition: A | Discharge status: Home / Self Care | Source: Ambulatory Visit | Attending: Internal Medicine | Admitting: Internal Medicine

## 2023-12-28 DIAGNOSIS — I5022 Chronic systolic (congestive) heart failure: Secondary | ICD-10-CM | POA: Insufficient documentation

## 2023-12-28 LAB — BASIC METABOLIC PANEL WITH GFR
Anion gap: 10 (ref 5–15)
BUN: 50 mg/dL — ABNORMAL HIGH (ref 8–23)
CO2: 27 mmol/L (ref 22–32)
Calcium: 9.5 mg/dL (ref 8.9–10.3)
Chloride: 100 mmol/L (ref 98–111)
Creatinine, Ser: 1.79 mg/dL — ABNORMAL HIGH (ref 0.61–1.24)
GFR, Estimated: 36 mL/min — ABNORMAL LOW (ref >60)
Glucose, Bld: 126 mg/dL — ABNORMAL HIGH (ref 70–99)
Potassium: 4.5 mmol/L (ref 3.5–5.1)
Sodium: 137 mmol/L (ref 135–145)

## 2023-12-29 ENCOUNTER — Other Ambulatory Visit (HOSPITAL_COMMUNITY): Payer: Self-pay

## 2024-01-04 ENCOUNTER — Other Ambulatory Visit: Payer: Self-pay

## 2024-01-04 MED FILL — Metoprolol Succinate Tab ER 24HR 25 MG (Tartrate Equiv): ORAL | 30 days supply | Qty: 30 | Fill #3 | Status: CN

## 2024-01-06 ENCOUNTER — Other Ambulatory Visit (HOSPITAL_COMMUNITY): Payer: Self-pay

## 2024-01-06 NOTE — Telephone Encounter (Addendum)
 Pt aware, agreeable, and verbalized understanding  Bmet ordered and scheduled   ----- Message from Elmarie Hacking sent at 01/06/2024  4:08 PM EDT ----- Please call

## 2024-01-12 DIAGNOSIS — I493 Ventricular premature depolarization: Secondary | ICD-10-CM | POA: Diagnosis not present

## 2024-01-16 NOTE — Addendum Note (Signed)
 Encounter addended by: Stan Eans, RN on: 01/16/2024 11:46 AM  Actions taken: Imaging Exam ended

## 2024-01-17 ENCOUNTER — Other Ambulatory Visit: Payer: Self-pay

## 2024-01-17 ENCOUNTER — Other Ambulatory Visit (HOSPITAL_COMMUNITY)

## 2024-01-17 MED FILL — Metoprolol Succinate Tab ER 24HR 25 MG (Tartrate Equiv): ORAL | 30 days supply | Qty: 30 | Fill #3 | Status: AC

## 2024-01-23 ENCOUNTER — Ambulatory Visit (HOSPITAL_COMMUNITY)
Admission: RE | Admit: 2024-01-23 | Discharge: 2024-01-23 | Disposition: A | Discharge status: Home / Self Care | Source: Ambulatory Visit | Attending: Cardiology | Admitting: Cardiology

## 2024-01-23 DIAGNOSIS — I5022 Chronic systolic (congestive) heart failure: Secondary | ICD-10-CM | POA: Diagnosis not present

## 2024-01-23 LAB — BASIC METABOLIC PANEL WITH GFR
Anion gap: 8 (ref 5–15)
BUN: 58 mg/dL — ABNORMAL HIGH (ref 8–23)
CO2: 26 mmol/L (ref 22–32)
Calcium: 9.2 mg/dL (ref 8.9–10.3)
Chloride: 100 mmol/L (ref 98–111)
Creatinine, Ser: 1.78 mg/dL — ABNORMAL HIGH (ref 0.61–1.24)
GFR, Estimated: 37 mL/min — ABNORMAL LOW (ref >60)
Glucose, Bld: 110 mg/dL — ABNORMAL HIGH (ref 70–99)
Potassium: 4.8 mmol/L (ref 3.5–5.1)
Sodium: 134 mmol/L — ABNORMAL LOW (ref 135–145)

## 2024-01-25 ENCOUNTER — Ambulatory Visit (HOSPITAL_COMMUNITY): Payer: Self-pay | Admitting: Family Medicine

## 2024-01-27 DIAGNOSIS — I25119 Atherosclerotic heart disease of native coronary artery with unspecified angina pectoris: Secondary | ICD-10-CM | POA: Diagnosis not present

## 2024-01-27 DIAGNOSIS — I509 Heart failure, unspecified: Secondary | ICD-10-CM | POA: Diagnosis not present

## 2024-01-27 DIAGNOSIS — I2510 Atherosclerotic heart disease of native coronary artery without angina pectoris: Secondary | ICD-10-CM | POA: Diagnosis not present

## 2024-01-27 DIAGNOSIS — I480 Paroxysmal atrial fibrillation: Secondary | ICD-10-CM | POA: Diagnosis not present

## 2024-02-03 ENCOUNTER — Other Ambulatory Visit (HOSPITAL_COMMUNITY): Payer: Self-pay

## 2024-02-06 ENCOUNTER — Other Ambulatory Visit: Payer: Self-pay

## 2024-02-06 ENCOUNTER — Other Ambulatory Visit (HOSPITAL_COMMUNITY): Payer: Self-pay

## 2024-02-09 ENCOUNTER — Other Ambulatory Visit (HOSPITAL_COMMUNITY): Payer: Self-pay

## 2024-02-09 ENCOUNTER — Other Ambulatory Visit: Payer: Self-pay

## 2024-02-09 MED ORDER — TAMSULOSIN HCL 0.4 MG PO CAPS
0.4000 mg | ORAL_CAPSULE | Freq: Every day | ORAL | 2 refills | Status: DC
  Filled 2024-02-14: qty 30, 30d supply, fill #0
  Filled 2024-03-15: qty 30, 30d supply, fill #1
  Filled 2024-04-12: qty 30, 30d supply, fill #2

## 2024-02-09 MED ORDER — MELOXICAM 7.5 MG PO TABS
7.5000 mg | ORAL_TABLET | Freq: Every morning | ORAL | 10 refills | Status: AC
  Filled 2024-02-09 – 2024-02-14 (×2): qty 30, 30d supply, fill #0
  Filled 2024-03-15: qty 30, 30d supply, fill #1
  Filled 2024-04-12 (×2): qty 30, 30d supply, fill #2
  Filled 2024-05-15 – 2024-05-23 (×3): qty 30, 30d supply, fill #3
  Filled 2024-06-25: qty 30, 30d supply, fill #4
  Filled 2024-07-17: qty 30, 30d supply, fill #5
  Filled 2024-08-21: qty 30, 30d supply, fill #6
  Filled 2024-09-19: qty 30, 30d supply, fill #7

## 2024-02-09 MED FILL — Metoprolol Succinate Tab ER 24HR 25 MG (Tartrate Equiv): ORAL | 30 days supply | Qty: 30 | Fill #4 | Status: CN

## 2024-02-10 ENCOUNTER — Other Ambulatory Visit (HOSPITAL_COMMUNITY): Payer: Self-pay

## 2024-02-13 DIAGNOSIS — N401 Enlarged prostate with lower urinary tract symptoms: Secondary | ICD-10-CM | POA: Diagnosis not present

## 2024-02-13 DIAGNOSIS — I2510 Atherosclerotic heart disease of native coronary artery without angina pectoris: Secondary | ICD-10-CM | POA: Diagnosis not present

## 2024-02-13 DIAGNOSIS — E7800 Pure hypercholesterolemia, unspecified: Secondary | ICD-10-CM | POA: Diagnosis not present

## 2024-02-13 DIAGNOSIS — I509 Heart failure, unspecified: Secondary | ICD-10-CM | POA: Diagnosis not present

## 2024-02-13 DIAGNOSIS — I25119 Atherosclerotic heart disease of native coronary artery with unspecified angina pectoris: Secondary | ICD-10-CM | POA: Diagnosis not present

## 2024-02-13 DIAGNOSIS — I480 Paroxysmal atrial fibrillation: Secondary | ICD-10-CM | POA: Diagnosis not present

## 2024-02-14 ENCOUNTER — Other Ambulatory Visit: Payer: Self-pay

## 2024-02-14 MED FILL — Metoprolol Succinate Tab ER 24HR 25 MG (Tartrate Equiv): ORAL | 30 days supply | Qty: 30 | Fill #4 | Status: AC

## 2024-02-18 ENCOUNTER — Other Ambulatory Visit (HOSPITAL_COMMUNITY): Payer: Self-pay

## 2024-02-21 ENCOUNTER — Other Ambulatory Visit: Payer: Self-pay

## 2024-02-23 ENCOUNTER — Other Ambulatory Visit: Payer: Self-pay

## 2024-02-23 ENCOUNTER — Other Ambulatory Visit (HOSPITAL_COMMUNITY): Payer: Self-pay

## 2024-02-23 MED ORDER — TRIAMCINOLONE ACETONIDE 0.1 % EX CREA
1.0000 | TOPICAL_CREAM | Freq: Two times a day (BID) | CUTANEOUS | 3 refills | Status: AC
  Filled 2024-02-23: qty 30, 15d supply, fill #0

## 2024-02-23 MED ORDER — CLOTRIMAZOLE 1 % EX CREA
1.0000 | TOPICAL_CREAM | Freq: Two times a day (BID) | CUTANEOUS | 0 refills | Status: AC
  Filled 2024-02-23: qty 45, 23d supply, fill #0

## 2024-02-24 ENCOUNTER — Other Ambulatory Visit: Payer: Self-pay

## 2024-02-25 DIAGNOSIS — I509 Heart failure, unspecified: Secondary | ICD-10-CM | POA: Diagnosis not present

## 2024-02-25 DIAGNOSIS — I25119 Atherosclerotic heart disease of native coronary artery with unspecified angina pectoris: Secondary | ICD-10-CM | POA: Diagnosis not present

## 2024-02-25 DIAGNOSIS — I2510 Atherosclerotic heart disease of native coronary artery without angina pectoris: Secondary | ICD-10-CM | POA: Diagnosis not present

## 2024-02-25 DIAGNOSIS — I480 Paroxysmal atrial fibrillation: Secondary | ICD-10-CM | POA: Diagnosis not present

## 2024-02-27 ENCOUNTER — Telehealth: Payer: Self-pay | Admitting: *Deleted

## 2024-02-27 ENCOUNTER — Other Ambulatory Visit (HOSPITAL_COMMUNITY): Payer: Self-pay

## 2024-02-27 NOTE — Telephone Encounter (Signed)
-----   Message from Wilbert Bihari sent at 12/14/2023  8:39 PM EDT ----- Please let patient know that they had a successful PAP titration and let DME know that orders are in EPIC.  Please set up 6 week OV with me.

## 2024-02-27 NOTE — Telephone Encounter (Addendum)
 DME=[facility]  The patient has been notified of the result and verbalized understanding.  All questions (if any) were answered. [name], CMA 02/27/2024 9:09 AM    The patient has declined to get his new Bipap machine today stating he sleeps sitting up in a recliner right now and he has nowhere to put the bipap machine. Patient states he will call us  back when he is ready to get the new machine.

## 2024-02-29 ENCOUNTER — Telehealth (HOSPITAL_COMMUNITY): Payer: Self-pay

## 2024-02-29 DIAGNOSIS — I493 Ventricular premature depolarization: Secondary | ICD-10-CM

## 2024-02-29 DIAGNOSIS — I4821 Permanent atrial fibrillation: Secondary | ICD-10-CM

## 2024-02-29 NOTE — Telephone Encounter (Signed)
 Called and spoke with patient, made him aware. Referral placed to EP.   Advised patient to call back to office with any issues, questions, or concerns. Patient verbalized understanding.

## 2024-02-29 NOTE — Telephone Encounter (Signed)
-----   Message from [facility] sent at 02/27/2024  2:49 PM EDT ----- Refer to EP for anti-arrhythmics.   Adi

## 2024-03-02 ENCOUNTER — Other Ambulatory Visit: Payer: Self-pay

## 2024-03-06 ENCOUNTER — Ambulatory Visit: Admitting: Podiatry

## 2024-03-06 ENCOUNTER — Encounter: Payer: Self-pay | Admitting: Podiatry

## 2024-03-06 DIAGNOSIS — B351 Tinea unguium: Secondary | ICD-10-CM | POA: Diagnosis not present

## 2024-03-06 DIAGNOSIS — M79675 Pain in left toe(s): Secondary | ICD-10-CM

## 2024-03-06 DIAGNOSIS — M79674 Pain in right toe(s): Secondary | ICD-10-CM

## 2024-03-06 DIAGNOSIS — D689 Coagulation defect, unspecified: Secondary | ICD-10-CM

## 2024-03-08 ENCOUNTER — Other Ambulatory Visit: Payer: Self-pay

## 2024-03-11 NOTE — Progress Notes (Signed)
  Subjective:  Patient ID: [name], male    DOB: 09/19/1934,  MRN: [id number]  [name] presents to clinic today for at risk foot care with h/o coagulation defect and painful mycotic toenails of both feet that are difficult to trim. Pain interferes with daily activities and wearing enclosed shoe gear comfortably.  Chief Complaint  Patient presents with   Nail Problem    Do my toenails.   New problem(s): None.   PCP is Charlott Dorn LABOR, MD.  Allergies  Allergen Reactions   Simvastatin Other (See Comments)    Muscle pain elevated CPK   Codeine Other (See Comments)   Empagliflozin      Other Reaction(s): rash, stomach upset   Penicillin G Other (See Comments)   Codeine Phosphate Other (See Comments)    constipation   Penicillins Rash    NOT AN ALLERGY PER PT, thinks the needle used dirty    Review of Systems: Negative except as noted in the HPI.  Objective: No changes noted in today's physical examination. There were no vitals filed for this visit. [name] is a pleasant 89 y.o. male obese in NAD. AAO x 3.  Vascular Examination: Capillary refill time immediate b/l. Palpable pedal pulses. Pedal hair present b/l. Pedal edema absent. No pain with calf compression b/l. Skin temperature gradient WNL b/l. No cyanosis or clubbing b/l. No ischemia or gangrene noted b/l. Pedal hair absent.  Neurological Examination: Sensation grossly intact b/l with 10 gram monofilament. Vibratory sensation intact b/l.   Dermatological Examination: Pedal skin with normal turgor, texture and tone b/l.  No open wounds. No interdigital macerations.   Toenails 1-5 b/l thick, discolored, elongated with subungual debris and pain on dorsal palpation.   Preulcerative lesion noted distal tip of left 2nd toe. There is visible subdermal hemorrhage. There is no surrounding erythema, no edema, no drainage, no odor, no fluctuance.  Musculoskeletal Examination: Muscle strength 5/5 to all  lower extremity muscle groups bilaterally. Clawtoe deformity L 2nd toe. Pes planus deformity noted bilateral LE.  Radiographs: None   Assessment/Plan: 1. Pain due to onychomycosis of toenails of both feet    Consent given for treatment. Patient examined. All patient's and/or POA's questions/concerns addressed on today's visit. Mycotic toenails 1-5 debrided in length and girth without incident. Preulcerative lesion(s) distal tip of left 2nd toe pared with sharp debridement without incident.Continue soft, supportive shoe gear daily. Report any pedal injuries to medical professional. Call office if there are any quesitons/concerns Return in about 3 months (around 06/06/2024).  [name], DPM      [place] LOCATION: [address], [place] [address]                   Office [phone]   [facility] LOCATION: [address] [place], [place] [address] Office [phone]

## 2024-03-13 ENCOUNTER — Other Ambulatory Visit (HOSPITAL_COMMUNITY): Payer: Self-pay

## 2024-03-13 ENCOUNTER — Other Ambulatory Visit: Payer: Self-pay

## 2024-03-15 ENCOUNTER — Other Ambulatory Visit: Payer: Self-pay

## 2024-03-15 DIAGNOSIS — I2510 Atherosclerotic heart disease of native coronary artery without angina pectoris: Secondary | ICD-10-CM | POA: Diagnosis not present

## 2024-03-15 DIAGNOSIS — N401 Enlarged prostate with lower urinary tract symptoms: Secondary | ICD-10-CM | POA: Diagnosis not present

## 2024-03-15 DIAGNOSIS — I480 Paroxysmal atrial fibrillation: Secondary | ICD-10-CM | POA: Diagnosis not present

## 2024-03-15 DIAGNOSIS — E7800 Pure hypercholesterolemia, unspecified: Secondary | ICD-10-CM | POA: Diagnosis not present

## 2024-03-15 DIAGNOSIS — I25119 Atherosclerotic heart disease of native coronary artery with unspecified angina pectoris: Secondary | ICD-10-CM | POA: Diagnosis not present

## 2024-03-15 DIAGNOSIS — I509 Heart failure, unspecified: Secondary | ICD-10-CM | POA: Diagnosis not present

## 2024-03-15 MED FILL — Metoprolol Succinate Tab ER 24HR 25 MG (Tartrate Equiv): ORAL | 30 days supply | Qty: 30 | Fill #5 | Status: AC

## 2024-03-16 ENCOUNTER — Other Ambulatory Visit: Payer: Self-pay

## 2024-03-26 DIAGNOSIS — I2510 Atherosclerotic heart disease of native coronary artery without angina pectoris: Secondary | ICD-10-CM | POA: Diagnosis not present

## 2024-03-26 DIAGNOSIS — I480 Paroxysmal atrial fibrillation: Secondary | ICD-10-CM | POA: Diagnosis not present

## 2024-03-26 DIAGNOSIS — I509 Heart failure, unspecified: Secondary | ICD-10-CM | POA: Diagnosis not present

## 2024-03-26 DIAGNOSIS — I25119 Atherosclerotic heart disease of native coronary artery with unspecified angina pectoris: Secondary | ICD-10-CM | POA: Diagnosis not present

## 2024-03-29 ENCOUNTER — Encounter: Payer: Self-pay | Admitting: Cardiology

## 2024-03-29 ENCOUNTER — Ambulatory Visit: Attending: Cardiology | Admitting: Cardiology

## 2024-03-29 VITALS — BP 116/70 | HR 95 | Ht 72.0 in | Wt 246.0 lb

## 2024-03-29 DIAGNOSIS — I4821 Permanent atrial fibrillation: Secondary | ICD-10-CM

## 2024-03-29 DIAGNOSIS — I493 Ventricular premature depolarization: Secondary | ICD-10-CM | POA: Diagnosis not present

## 2024-03-29 NOTE — Progress Notes (Signed)
 Electrophysiology Office Note:    Date:  03/29/2024   ID:  [name], DOB [birth year], MRN [id number]  [facility] Cardiologist:  Wilbert Bihari, MD  [facility] Electrophysiologist:  [name] ONEIDA HOLTS, MD   Referring MD: Gardenia Led, DO   Chief Complaint: Atrial fibrillation and PVCs  History of Present Illness:    [name] is an 89 year old man who I am seeing today for an evaluation of atrial fibrillation and PVCs at the request of Dr. Gardenia.  The patient has a history of chronic systolic heart failure, coronary artery disease, permanent atrial fibrillation, PVCs, hypertension, hyperlipidemia.  The patient last saw Dr. Gardenia November 28, 2023.  The patient had an EF of 45% on an echo in March and then 25% on a PET later that month.  He is doing well today.  He is with his wife.  He is never concerned about his PVCs in general.  Reports stable exercise tolerance.  No fluid accumulation.  No syncope or presyncope.  He is very hesitant to consider any medications given past intolerances.     Their past medical, social and family history was reviewed.   ROS:   Please see the history of present illness.    All other systems reviewed and are negative.  EKGs/Labs/Other Studies Reviewed:    The following studies were reviewed today:  February 19, 2024 ZIO monitor 100% atrial fibrillation Frequent NSVT episodes, longest 11 seconds with a rate of 119 bpm. Frequent ventricular ectopy, 22%  EKG Interpretation Date/Time:  Thursday March 29 2024 15:58:35 EDT Ventricular Rate:  94 PR Interval:    QRS Duration:  134 QT Interval:  372 QTC Calculation: 465 R Axis:   22  Text Interpretation: Atrial fibrillation with premature ventricular or aberrantly conducted complexes Confirmed by Snow [name] [id number]) on 03/29/2024 4:05:23 PM    Physical Exam:    VS:  BP 116/70 (BP Location: Right Arm, Patient Position: Sitting, Cuff Size: Large)   Pulse 95   Ht 6'  (1.829 m)   Wt 246 lb (111.6 kg)   SpO2 97%   BMI 33.36 kg/m     Wt Readings from Last 3 Encounters:  03/29/24 246 lb (111.6 kg)  12/19/23 250 lb 3.2 oz (113.5 kg)  11/28/23 251 lb 6.4 oz (114 kg)     GEN: no distress, obese, elderly CARD: Irregularly irregular, No MRG RESP: No IWOB. CTAB.        ASSESSMENT AND PLAN:    1. PVCs (premature ventricular contractions)   2. Permanent atrial fibrillation (HCC)     #Permanent atrial fibrillation On Eliquis  for stroke prophylaxis  #Frequent PVCs #NSVT Unclear whether this could be contributing to LV dysfunction or if the LV dysfunction is causing the ventricular ectopy.  I discussed treatment strategies.  Given his advanced age and comorbidities, he is not a candidate for invasive EP procedures.  He was previously on amiodarone  but this had to be discontinued because of off target effects.  He is not a candidate for 1C drugs because of coronary artery disease.  Given LV dysfunction sotalol is not a good option.    I discussed using mexiletine but after discussing the possible side effects the patient opted to continue with a conservative management strategy.  We will not pursue any antiarrhythmic drugs or invasive EP procedures.    He will follow-up with EP on an as-needed basis.         Signed, [name] ONEIDA. HOLTS, MD, [facility], [facility] 03/29/2024  4:05 PM    Electrophysiology [facility]

## 2024-03-29 NOTE — Patient Instructions (Signed)
 Medication Instructions:  Your physician recommends that you continue on your current medications as directed. Please refer to the Current Medication list given to you today.  *If you need a refill on your cardiac medications before your next appointment, please call your pharmacy*  Follow-Up: At [facility], you and your health needs are our priority.  As part of our continuing mission to provide you with exceptional heart care, our providers are all part of one team.  This team includes your primary Cardiologist (physician) and Advanced Practice Providers or APPs (Physician Assistants and Nurse Practitioners) who all work together to provide you with the care you need, when you need it.  Your next appointment:   As needed with Dr. Cindie

## 2024-04-02 ENCOUNTER — Other Ambulatory Visit: Payer: Self-pay

## 2024-04-04 DIAGNOSIS — H18511 Endothelial corneal dystrophy, right eye: Secondary | ICD-10-CM | POA: Diagnosis not present

## 2024-04-04 DIAGNOSIS — Z961 Presence of intraocular lens: Secondary | ICD-10-CM | POA: Diagnosis not present

## 2024-04-04 DIAGNOSIS — Z947 Corneal transplant status: Secondary | ICD-10-CM | POA: Diagnosis not present

## 2024-04-04 DIAGNOSIS — H40003 Preglaucoma, unspecified, bilateral: Secondary | ICD-10-CM | POA: Diagnosis not present

## 2024-04-12 ENCOUNTER — Other Ambulatory Visit: Payer: Self-pay

## 2024-04-12 MED FILL — Metoprolol Succinate Tab ER 24HR 25 MG (Tartrate Equiv): ORAL | 30 days supply | Qty: 30 | Fill #6 | Status: AC

## 2024-04-13 ENCOUNTER — Other Ambulatory Visit: Payer: Self-pay

## 2024-04-15 DIAGNOSIS — I25119 Atherosclerotic heart disease of native coronary artery with unspecified angina pectoris: Secondary | ICD-10-CM | POA: Diagnosis not present

## 2024-04-15 DIAGNOSIS — I509 Heart failure, unspecified: Secondary | ICD-10-CM | POA: Diagnosis not present

## 2024-04-15 DIAGNOSIS — N401 Enlarged prostate with lower urinary tract symptoms: Secondary | ICD-10-CM | POA: Diagnosis not present

## 2024-04-15 DIAGNOSIS — I480 Paroxysmal atrial fibrillation: Secondary | ICD-10-CM | POA: Diagnosis not present

## 2024-04-15 DIAGNOSIS — I2510 Atherosclerotic heart disease of native coronary artery without angina pectoris: Secondary | ICD-10-CM | POA: Diagnosis not present

## 2024-04-15 DIAGNOSIS — E7800 Pure hypercholesterolemia, unspecified: Secondary | ICD-10-CM | POA: Diagnosis not present

## 2024-05-04 ENCOUNTER — Other Ambulatory Visit (HOSPITAL_BASED_OUTPATIENT_CLINIC_OR_DEPARTMENT_OTHER): Payer: Self-pay | Admitting: Cardiology

## 2024-05-04 ENCOUNTER — Other Ambulatory Visit: Payer: Self-pay

## 2024-05-04 NOTE — Telephone Encounter (Signed)
 Prescription refill request for Eliquis  received. Indication:afib Last office visit:8/25 Scr:1.78  6/25 Age: 89 Weight:111.6  kg  Under review

## 2024-05-07 ENCOUNTER — Other Ambulatory Visit (HOSPITAL_COMMUNITY): Payer: Self-pay

## 2024-05-07 ENCOUNTER — Other Ambulatory Visit: Payer: Self-pay

## 2024-05-07 MED FILL — Apixaban Tab 2.5 MG: ORAL | 21 days supply | Qty: 42 | Fill #0 | Status: AC

## 2024-05-07 MED FILL — Apixaban Tab 2.5 MG: ORAL | 21 days supply | Qty: 42 | Fill #0 | Status: CN

## 2024-05-09 ENCOUNTER — Other Ambulatory Visit (HOSPITAL_COMMUNITY): Payer: Self-pay

## 2024-05-11 ENCOUNTER — Other Ambulatory Visit (HOSPITAL_COMMUNITY): Payer: Self-pay

## 2024-05-15 ENCOUNTER — Other Ambulatory Visit: Payer: Self-pay

## 2024-05-15 ENCOUNTER — Other Ambulatory Visit (HOSPITAL_COMMUNITY): Payer: Self-pay

## 2024-05-15 DIAGNOSIS — I480 Paroxysmal atrial fibrillation: Secondary | ICD-10-CM | POA: Diagnosis not present

## 2024-05-15 DIAGNOSIS — E7800 Pure hypercholesterolemia, unspecified: Secondary | ICD-10-CM | POA: Diagnosis not present

## 2024-05-15 DIAGNOSIS — N401 Enlarged prostate with lower urinary tract symptoms: Secondary | ICD-10-CM | POA: Diagnosis not present

## 2024-05-15 DIAGNOSIS — I25119 Atherosclerotic heart disease of native coronary artery with unspecified angina pectoris: Secondary | ICD-10-CM | POA: Diagnosis not present

## 2024-05-15 DIAGNOSIS — I2510 Atherosclerotic heart disease of native coronary artery without angina pectoris: Secondary | ICD-10-CM | POA: Diagnosis not present

## 2024-05-15 DIAGNOSIS — I509 Heart failure, unspecified: Secondary | ICD-10-CM | POA: Diagnosis not present

## 2024-05-15 MED ORDER — TAMSULOSIN HCL 0.4 MG PO CAPS
0.4000 mg | ORAL_CAPSULE | Freq: Every day | ORAL | 2 refills | Status: AC
  Filled 2024-05-15 – 2024-07-17 (×3): qty 30, 30d supply, fill #0
  Filled 2024-08-21: qty 30, 30d supply, fill #1
  Filled 2024-09-19: qty 30, 30d supply, fill #2

## 2024-05-15 MED ORDER — TAMSULOSIN HCL 0.4 MG PO CAPS
0.4000 mg | ORAL_CAPSULE | Freq: Every day | ORAL | 2 refills | Status: AC
  Filled 2024-05-15: qty 90, 90d supply, fill #0
  Filled 2024-05-23: qty 30, 30d supply, fill #0
  Filled 2024-06-25: qty 30, 30d supply, fill #1

## 2024-05-15 MED ORDER — KETOCONAZOLE 2 % EX CREA
TOPICAL_CREAM | CUTANEOUS | 10 refills | Status: AC
  Filled 2024-05-15 – 2024-05-17 (×4): qty 60, 30d supply, fill #0
  Filled 2024-06-25: qty 60, 30d supply, fill #1
  Filled 2024-07-17: qty 60, 30d supply, fill #2

## 2024-05-15 MED FILL — Apixaban Tab 2.5 MG: ORAL | 30 days supply | Qty: 60 | Fill #1 | Status: CN

## 2024-05-15 MED FILL — Metoprolol Succinate Tab ER 24HR 25 MG (Tartrate Equiv): ORAL | 30 days supply | Qty: 30 | Fill #7 | Status: CN

## 2024-05-16 ENCOUNTER — Other Ambulatory Visit: Payer: Self-pay

## 2024-05-16 ENCOUNTER — Other Ambulatory Visit (HOSPITAL_BASED_OUTPATIENT_CLINIC_OR_DEPARTMENT_OTHER): Payer: Self-pay

## 2024-05-17 ENCOUNTER — Other Ambulatory Visit: Payer: Self-pay

## 2024-05-17 ENCOUNTER — Other Ambulatory Visit (HOSPITAL_COMMUNITY): Payer: Self-pay

## 2024-05-23 ENCOUNTER — Other Ambulatory Visit (HOSPITAL_COMMUNITY): Payer: Self-pay

## 2024-05-23 ENCOUNTER — Other Ambulatory Visit: Payer: Self-pay

## 2024-05-23 MED FILL — Metoprolol Succinate Tab ER 24HR 25 MG (Tartrate Equiv): ORAL | 30 days supply | Qty: 30 | Fill #7 | Status: AC

## 2024-05-23 MED FILL — Apixaban Tab 2.5 MG: ORAL | 30 days supply | Qty: 60 | Fill #1 | Status: AC

## 2024-05-24 ENCOUNTER — Other Ambulatory Visit: Payer: Self-pay

## 2024-05-29 DIAGNOSIS — I2510 Atherosclerotic heart disease of native coronary artery without angina pectoris: Secondary | ICD-10-CM | POA: Diagnosis not present

## 2024-05-29 DIAGNOSIS — I509 Heart failure, unspecified: Secondary | ICD-10-CM | POA: Diagnosis not present

## 2024-05-29 DIAGNOSIS — I480 Paroxysmal atrial fibrillation: Secondary | ICD-10-CM | POA: Diagnosis not present

## 2024-05-29 DIAGNOSIS — I25119 Atherosclerotic heart disease of native coronary artery with unspecified angina pectoris: Secondary | ICD-10-CM | POA: Diagnosis not present

## 2024-06-15 ENCOUNTER — Other Ambulatory Visit: Payer: Self-pay

## 2024-06-15 DIAGNOSIS — I509 Heart failure, unspecified: Secondary | ICD-10-CM | POA: Diagnosis not present

## 2024-06-15 DIAGNOSIS — I2510 Atherosclerotic heart disease of native coronary artery without angina pectoris: Secondary | ICD-10-CM | POA: Diagnosis not present

## 2024-06-15 DIAGNOSIS — I480 Paroxysmal atrial fibrillation: Secondary | ICD-10-CM | POA: Diagnosis not present

## 2024-06-15 DIAGNOSIS — N401 Enlarged prostate with lower urinary tract symptoms: Secondary | ICD-10-CM | POA: Diagnosis not present

## 2024-06-15 DIAGNOSIS — I25119 Atherosclerotic heart disease of native coronary artery with unspecified angina pectoris: Secondary | ICD-10-CM | POA: Diagnosis not present

## 2024-06-15 DIAGNOSIS — E7800 Pure hypercholesterolemia, unspecified: Secondary | ICD-10-CM | POA: Diagnosis not present

## 2024-06-19 ENCOUNTER — Encounter: Payer: Self-pay | Admitting: Podiatry

## 2024-06-19 ENCOUNTER — Ambulatory Visit: Admitting: Podiatry

## 2024-06-19 DIAGNOSIS — B351 Tinea unguium: Secondary | ICD-10-CM | POA: Diagnosis not present

## 2024-06-19 DIAGNOSIS — D689 Coagulation defect, unspecified: Secondary | ICD-10-CM | POA: Diagnosis not present

## 2024-06-19 DIAGNOSIS — M79674 Pain in right toe(s): Secondary | ICD-10-CM

## 2024-06-19 DIAGNOSIS — M79675 Pain in left toe(s): Secondary | ICD-10-CM | POA: Diagnosis not present

## 2024-06-23 ENCOUNTER — Other Ambulatory Visit (HOSPITAL_COMMUNITY): Payer: Self-pay

## 2024-06-24 NOTE — Progress Notes (Signed)
  Subjective:  Patient ID: [name], male    DOB: 02/03/1935,  MRN: [id number]  [name] presents to clinic today for at risk foot care with h/o coagulation defect and painful elongated mycotic toenails 1-5 bilaterally which are tender when wearing enclosed shoe gear. Pain is relieved with periodic professional debridement.  Chief Complaint  Patient presents with   RFC    RFC Not diabetic. [name]  PCP [name] Dorn LABOR, [name], 03/26/24   New problem(s): None.   PCP is [name] Dorn LABOR, [name].  Allergies  Allergen Reactions   Simvastatin Other (See Comments)    Muscle pain elevated CPK   Codeine Other (See Comments)   Empagliflozin      Other Reaction(s): rash, stomach upset   Penicillin G Other (See Comments)   Codeine Phosphate Other (See Comments)    constipation   Penicillins Rash    NOT AN ALLERGY PER PT, thinks the needle used dirty    Review of Systems: Negative except as noted in the HPI.  Objective: No changes noted in today's physical examination. There were no vitals filed for this visit. [name] is a pleasant 89 y.o. male obese in NAD. AAO x 3.  Vascular Examination: Capillary refill time immediate b/l. Vascular status intact b/l with palpable pedal pulses. Pedal hair absent b/l. No pain with calf compression b/l. Skin temperature gradient WNL b/l. No cyanosis or clubbing b/l. No ischemia or gangrene noted b/l. No edema noted b/l LE.  Neurological Examination: Sensation grossly intact b/l with 10 gram monofilament. Vibratory sensation intact b/l.   Dermatological Examination: Pedal skin with normal turgor, texture and tone b/l.  No open wounds. No interdigital macerations.   Toenails 1-5 b/l thick, discolored, elongated with subungual debris and pain on dorsal palpation.   Preulcerative lesion noted distal tip of left 2nd toe. There is visible subdermal hemorrhage. There is no surrounding erythema, no edema, no drainage, no odor, no  fluctuance.  Musculoskeletal Examination: Muscle strength 5/5 to all lower extremity muscle groups bilaterally. Clawtoe deformity distal tip of left 2nd toe. Pes planus deformity noted bilateral LE.  Radiographs: None  Assessment/Plan: 1. Pain due to onychomycosis of toenails of both feet   2. Clotting disorder   Patient was evaluated and treated. All patient's and/or POA's questions/concerns addressed on today's visit. Toenails 1-5 b/l debrided in length and girth without incident. Preulcerative lesion(s) distal tip of left 2nd toe pared with sharp debridement without incident. Continue soft, supportive shoe gear daily. Report any pedal injuries to medical professional. Call office if there are any questions/concerns.  Return in about 3 months (around 09/19/2024).  [name], DPM      [place] LOCATION: [address], [place] [address]                   Office [phone]   [facility] LOCATION: [address] [place], [place] [address] Office [phone]

## 2024-06-25 ENCOUNTER — Other Ambulatory Visit (HOSPITAL_COMMUNITY): Payer: Self-pay

## 2024-06-25 ENCOUNTER — Other Ambulatory Visit: Payer: Self-pay

## 2024-06-25 MED FILL — Metoprolol Succinate Tab ER 24HR 25 MG (Tartrate Equiv): ORAL | 30 days supply | Qty: 30 | Fill #8 | Status: AC

## 2024-06-25 MED FILL — Apixaban Tab 2.5 MG: ORAL | 30 days supply | Qty: 60 | Fill #2 | Status: AC

## 2024-06-28 DIAGNOSIS — I480 Paroxysmal atrial fibrillation: Secondary | ICD-10-CM | POA: Diagnosis not present

## 2024-06-28 DIAGNOSIS — I25119 Atherosclerotic heart disease of native coronary artery with unspecified angina pectoris: Secondary | ICD-10-CM | POA: Diagnosis not present

## 2024-06-28 DIAGNOSIS — I509 Heart failure, unspecified: Secondary | ICD-10-CM | POA: Diagnosis not present

## 2024-06-28 DIAGNOSIS — I2510 Atherosclerotic heart disease of native coronary artery without angina pectoris: Secondary | ICD-10-CM | POA: Diagnosis not present

## 2024-06-30 ENCOUNTER — Other Ambulatory Visit: Payer: Self-pay

## 2024-07-05 ENCOUNTER — Other Ambulatory Visit (HOSPITAL_COMMUNITY): Payer: Self-pay

## 2024-07-05 ENCOUNTER — Other Ambulatory Visit: Payer: Self-pay

## 2024-07-10 ENCOUNTER — Other Ambulatory Visit (HOSPITAL_COMMUNITY): Payer: Self-pay

## 2024-07-10 ENCOUNTER — Other Ambulatory Visit: Payer: Self-pay

## 2024-07-10 MED ORDER — ROSUVASTATIN CALCIUM 40 MG PO TABS
40.0000 mg | ORAL_TABLET | Freq: Every day | ORAL | 3 refills | Status: AC
  Filled 2024-07-19: qty 30, 30d supply, fill #0
  Filled 2024-08-21: qty 30, 30d supply, fill #1
  Filled 2024-09-19: qty 30, 30d supply, fill #2

## 2024-07-11 ENCOUNTER — Other Ambulatory Visit: Payer: Self-pay

## 2024-07-11 ENCOUNTER — Other Ambulatory Visit (HOSPITAL_COMMUNITY): Payer: Self-pay

## 2024-07-15 DIAGNOSIS — N401 Enlarged prostate with lower urinary tract symptoms: Secondary | ICD-10-CM | POA: Diagnosis not present

## 2024-07-15 DIAGNOSIS — I2510 Atherosclerotic heart disease of native coronary artery without angina pectoris: Secondary | ICD-10-CM | POA: Diagnosis not present

## 2024-07-15 DIAGNOSIS — I509 Heart failure, unspecified: Secondary | ICD-10-CM | POA: Diagnosis not present

## 2024-07-15 DIAGNOSIS — I480 Paroxysmal atrial fibrillation: Secondary | ICD-10-CM | POA: Diagnosis not present

## 2024-07-15 DIAGNOSIS — I25119 Atherosclerotic heart disease of native coronary artery with unspecified angina pectoris: Secondary | ICD-10-CM | POA: Diagnosis not present

## 2024-07-15 DIAGNOSIS — E7800 Pure hypercholesterolemia, unspecified: Secondary | ICD-10-CM | POA: Diagnosis not present

## 2024-07-17 ENCOUNTER — Other Ambulatory Visit: Payer: Self-pay

## 2024-07-17 MED FILL — Metoprolol Succinate Tab ER 24HR 25 MG (Tartrate Equiv): ORAL | 30 days supply | Qty: 30 | Fill #9 | Status: AC

## 2024-07-17 MED FILL — Apixaban Tab 2.5 MG: ORAL | 30 days supply | Qty: 60 | Fill #3 | Status: AC

## 2024-07-19 ENCOUNTER — Other Ambulatory Visit: Payer: Self-pay

## 2024-08-21 ENCOUNTER — Other Ambulatory Visit: Payer: Self-pay | Admitting: Cardiology

## 2024-08-21 ENCOUNTER — Other Ambulatory Visit: Payer: Self-pay

## 2024-08-21 DIAGNOSIS — I493 Ventricular premature depolarization: Secondary | ICD-10-CM

## 2024-08-21 MED FILL — Apixaban Tab 2.5 MG: ORAL | 30 days supply | Qty: 60 | Fill #4 | Status: AC

## 2024-08-22 ENCOUNTER — Other Ambulatory Visit (HOSPITAL_COMMUNITY): Payer: Self-pay

## 2024-08-22 ENCOUNTER — Other Ambulatory Visit: Payer: Self-pay

## 2024-08-22 MED ORDER — METOPROLOL SUCCINATE ER 25 MG PO TB24
25.0000 mg | ORAL_TABLET | Freq: Every morning | ORAL | 3 refills | Status: AC
  Filled 2024-08-22: qty 30, 30d supply, fill #0
  Filled 2024-09-19: qty 30, 30d supply, fill #1

## 2024-09-19 ENCOUNTER — Other Ambulatory Visit: Payer: Self-pay

## 2024-09-19 MED FILL — Apixaban Tab 2.5 MG: ORAL | 30 days supply | Qty: 60 | Fill #5 | Status: CN

## 2024-09-20 ENCOUNTER — Other Ambulatory Visit (HOSPITAL_COMMUNITY): Payer: Self-pay

## 2024-10-17 ENCOUNTER — Ambulatory Visit: Admitting: Podiatry
